# Patient Record
Sex: Female | Born: 1950
Health system: Southern US, Community
[De-identification: ages and names within clinical notes are randomized; demographics above are authoritative.]

## PROBLEM LIST (undated history)

## (undated) DIAGNOSIS — E079 Disorder of thyroid, unspecified: Secondary | ICD-10-CM

## (undated) DIAGNOSIS — E785 Hyperlipidemia, unspecified: Secondary | ICD-10-CM

## (undated) DIAGNOSIS — R251 Tremor, unspecified: Secondary | ICD-10-CM

## (undated) DIAGNOSIS — I10 Essential (primary) hypertension: Secondary | ICD-10-CM

## (undated) HISTORY — DX: Essential (primary) hypertension: I10

## (undated) HISTORY — DX: Disorder of thyroid, unspecified: E07.9

## (undated) HISTORY — DX: Hyperlipidemia, unspecified: E78.5

---

## 1997-11-13 ENCOUNTER — Other Ambulatory Visit: Admission: RE | Admit: 1997-11-13 | Discharge: 1997-11-13 | Payer: Self-pay | Admitting: Obstetrics and Gynecology

## 2002-02-24 ENCOUNTER — Encounter: Admission: RE | Admit: 2002-02-24 | Discharge: 2002-02-24 | Payer: Self-pay | Admitting: Obstetrics and Gynecology

## 2002-02-24 ENCOUNTER — Encounter: Payer: Self-pay | Admitting: Obstetrics and Gynecology

## 2003-09-06 ENCOUNTER — Encounter: Admission: RE | Admit: 2003-09-06 | Discharge: 2003-09-06 | Payer: Self-pay | Admitting: Obstetrics and Gynecology

## 2004-04-27 ENCOUNTER — Emergency Department (HOSPITAL_COMMUNITY): Admission: EM | Admit: 2004-04-27 | Discharge: 2004-04-27 | Payer: Self-pay | Admitting: Family Medicine

## 2004-09-09 ENCOUNTER — Encounter: Admission: RE | Admit: 2004-09-09 | Discharge: 2004-09-09 | Payer: Self-pay | Admitting: Obstetrics and Gynecology

## 2004-09-23 ENCOUNTER — Encounter: Admission: RE | Admit: 2004-09-23 | Discharge: 2004-09-23 | Payer: Self-pay | Admitting: Obstetrics and Gynecology

## 2004-12-29 ENCOUNTER — Encounter: Admission: RE | Admit: 2004-12-29 | Discharge: 2004-12-29 | Payer: Self-pay | Admitting: Family Medicine

## 2005-02-03 ENCOUNTER — Ambulatory Visit (HOSPITAL_COMMUNITY): Admission: RE | Admit: 2005-02-03 | Discharge: 2005-02-03 | Payer: Self-pay | Admitting: Gastroenterology

## 2005-09-10 ENCOUNTER — Encounter: Admission: RE | Admit: 2005-09-10 | Discharge: 2005-09-10 | Payer: Self-pay | Admitting: Obstetrics and Gynecology

## 2006-09-13 ENCOUNTER — Encounter: Admission: RE | Admit: 2006-09-13 | Discharge: 2006-09-13 | Payer: Self-pay | Admitting: Family Medicine

## 2007-09-14 ENCOUNTER — Encounter: Admission: RE | Admit: 2007-09-14 | Discharge: 2007-09-14 | Payer: Self-pay | Admitting: Obstetrics and Gynecology

## 2008-09-17 ENCOUNTER — Encounter: Admission: RE | Admit: 2008-09-17 | Discharge: 2008-09-17 | Payer: Self-pay | Admitting: Obstetrics and Gynecology

## 2010-02-11 ENCOUNTER — Encounter: Admission: RE | Admit: 2010-02-11 | Discharge: 2010-02-11 | Payer: Self-pay | Admitting: Obstetrics and Gynecology

## 2010-06-22 ENCOUNTER — Encounter: Payer: Self-pay | Admitting: Obstetrics and Gynecology

## 2013-03-20 ENCOUNTER — Other Ambulatory Visit: Payer: Self-pay

## 2013-03-20 DIAGNOSIS — Z1231 Encounter for screening mammogram for malignant neoplasm of breast: Secondary | ICD-10-CM

## 2013-04-12 ENCOUNTER — Ambulatory Visit
Admission: RE | Admit: 2013-04-12 | Discharge: 2013-04-12 | Disposition: A | Discharge status: Home / Self Care | Payer: No Typology Code available for payment source | Source: Ambulatory Visit

## 2013-04-12 DIAGNOSIS — Z1231 Encounter for screening mammogram for malignant neoplasm of breast: Secondary | ICD-10-CM

## 2013-04-14 ENCOUNTER — Other Ambulatory Visit: Payer: Self-pay | Admitting: Nurse Practitioner

## 2013-04-14 DIAGNOSIS — R928 Other abnormal and inconclusive findings on diagnostic imaging of breast: Secondary | ICD-10-CM

## 2013-04-18 ENCOUNTER — Other Ambulatory Visit: Payer: Self-pay | Admitting: Obstetrics & Gynecology

## 2013-04-18 DIAGNOSIS — Z780 Asymptomatic menopausal state: Secondary | ICD-10-CM

## 2013-05-05 ENCOUNTER — Ambulatory Visit
Admission: RE | Admit: 2013-05-05 | Discharge: 2013-05-05 | Disposition: A | Discharge status: Home / Self Care | Payer: No Typology Code available for payment source | Source: Ambulatory Visit | Attending: Nurse Practitioner | Admitting: Nurse Practitioner

## 2013-05-05 DIAGNOSIS — R928 Other abnormal and inconclusive findings on diagnostic imaging of breast: Secondary | ICD-10-CM

## 2013-10-02 ENCOUNTER — Other Ambulatory Visit: Payer: Self-pay | Admitting: Nurse Practitioner

## 2013-10-02 DIAGNOSIS — N6489 Other specified disorders of breast: Secondary | ICD-10-CM

## 2013-11-06 ENCOUNTER — Encounter (INDEPENDENT_AMBULATORY_CARE_PROVIDER_SITE_OTHER): Payer: Self-pay

## 2013-11-06 ENCOUNTER — Ambulatory Visit
Admission: RE | Admit: 2013-11-06 | Discharge: 2013-11-06 | Disposition: A | Discharge status: Home / Self Care | Payer: 59 | Source: Ambulatory Visit | Attending: Nurse Practitioner | Admitting: Nurse Practitioner

## 2013-11-06 DIAGNOSIS — N6489 Other specified disorders of breast: Secondary | ICD-10-CM

## 2014-11-15 ENCOUNTER — Other Ambulatory Visit: Payer: Self-pay | Admitting: Nurse Practitioner

## 2014-11-15 DIAGNOSIS — N6320 Unspecified lump in the left breast, unspecified quadrant: Secondary | ICD-10-CM

## 2014-11-23 ENCOUNTER — Ambulatory Visit
Admission: RE | Admit: 2014-11-23 | Discharge: 2014-11-23 | Disposition: A | Discharge status: Home / Self Care | Payer: Medicare Other | Source: Ambulatory Visit | Attending: Nurse Practitioner | Admitting: Nurse Practitioner

## 2014-11-23 DIAGNOSIS — N6320 Unspecified lump in the left breast, unspecified quadrant: Secondary | ICD-10-CM

## 2015-12-31 DIAGNOSIS — D649 Anemia, unspecified: Secondary | ICD-10-CM | POA: Diagnosis not present

## 2015-12-31 DIAGNOSIS — N951 Menopausal and female climacteric states: Secondary | ICD-10-CM | POA: Diagnosis not present

## 2015-12-31 DIAGNOSIS — E559 Vitamin D deficiency, unspecified: Secondary | ICD-10-CM | POA: Diagnosis not present

## 2015-12-31 DIAGNOSIS — Z1321 Encounter for screening for nutritional disorder: Secondary | ICD-10-CM | POA: Diagnosis not present

## 2015-12-31 DIAGNOSIS — R5383 Other fatigue: Secondary | ICD-10-CM | POA: Diagnosis not present

## 2015-12-31 DIAGNOSIS — E7800 Pure hypercholesterolemia, unspecified: Secondary | ICD-10-CM | POA: Diagnosis not present

## 2015-12-31 DIAGNOSIS — Z13220 Encounter for screening for lipoid disorders: Secondary | ICD-10-CM | POA: Diagnosis not present

## 2015-12-31 DIAGNOSIS — Z139 Encounter for screening, unspecified: Secondary | ICD-10-CM | POA: Diagnosis not present

## 2015-12-31 DIAGNOSIS — E039 Hypothyroidism, unspecified: Secondary | ICD-10-CM | POA: Diagnosis not present

## 2016-03-04 ENCOUNTER — Other Ambulatory Visit: Payer: Self-pay | Admitting: Nurse Practitioner

## 2016-03-04 DIAGNOSIS — Z1231 Encounter for screening mammogram for malignant neoplasm of breast: Secondary | ICD-10-CM

## 2016-03-20 ENCOUNTER — Ambulatory Visit
Admission: RE | Admit: 2016-03-20 | Discharge: 2016-03-20 | Disposition: A | Discharge status: Home / Self Care | Payer: Medicare Other | Source: Ambulatory Visit | Attending: Nurse Practitioner | Admitting: Nurse Practitioner

## 2016-03-20 DIAGNOSIS — Z1231 Encounter for screening mammogram for malignant neoplasm of breast: Secondary | ICD-10-CM

## 2017-08-23 ENCOUNTER — Other Ambulatory Visit: Payer: Self-pay

## 2017-08-23 ENCOUNTER — Encounter: Payer: Self-pay | Admitting: Physician Assistant

## 2017-08-23 ENCOUNTER — Ambulatory Visit (INDEPENDENT_AMBULATORY_CARE_PROVIDER_SITE_OTHER): Payer: Medicare Other | Admitting: Physician Assistant

## 2017-08-23 DIAGNOSIS — E039 Hypothyroidism, unspecified: Secondary | ICD-10-CM | POA: Insufficient documentation

## 2017-08-23 DIAGNOSIS — E785 Hyperlipidemia, unspecified: Secondary | ICD-10-CM

## 2017-08-23 NOTE — Progress Notes (Signed)
Patient ID: Darlene Marsh MRN: [id number], DOB: [birth year], 67 y.o. Date of Encounter: 08/23/2017,   Chief Complaint: Establish Care  HPI: 67 y.o. y/o female here to [place].   Reports that she had been going to [facility] until her office closed.  Reports that she was on thyroid pills for over 30 years.  Says that it was "the smallest dose ". Has been out of the thyroid medication since around November.  Says that [facility] Schwartz also would check her cholesterol every year.  Says that this past November was the one year mark and knows that it is been over a year since this was last checked.  She says that in November her mammogram was also due and that when she tried to schedule it--- they told her they could not do it unless she had a PCP. Her mom and her mom's 2 sisters had breast cancer so that is why she make sure she gets her mammograms annually.  Patient reports that she personally has no history of any GYN cancer.  Has had no ovarian or uterine cancer.  Asked if she is wanting to check a full panel of labs or just thyroid and cholesterol.  She says that she only wants to check those 2 things. Says her cholesterol has been " a little high" in the past.  Says otherwise she is feeling fine and feels that "if it's not broken, don't have to fix it ". She refuses to have any immunizations.  Says that [facility] used to talk to her about pneumonia vaccines etc. but she feels fine and does not want these. Says that she did have a colonoscopy about 5 years ago and that was normal.  Reports that she has no other known medical problems no other past medical history.  She has no other concerns to address today.   Review of Systems: Consitutional: No fever, chills, fatigue, night sweats, lymphadenopathy. No significant/unexplained weight changes. Eyes: No visual changes, eye redness, or discharge. ENT/Mouth: No ear pain, sore throat, nasal drainage, or sinus pain. Cardiovascular: No chest  pressure,heaviness, tightness or squeezing, even with exertion. No increased shortness of breath or dyspnea on exertion.No palpitations, edema, orthopnea, PND. Respiratory: No cough, hemoptysis, SOB, or wheezing. Gastrointestinal: No anorexia, dysphagia, reflux, pain, nausea, vomiting, hematemesis, diarrhea, constipation, BRBPR, or melena. Breast: No mass, nodules, bulging, or retraction. No skin changes or inflammation. No nipple discharge. No lymphadenopathy. Genitourinary: No dysuria, hematuria, incontinence, vaginal discharge, pruritis, burning, abnormal bleeding, or pain. Musculoskeletal: No decreased ROM, No joint pain or swelling. No significant pain in neck, back, or extremities. Skin: No rash, pruritis, or concerning lesions. Neurological: No headache, dizziness, syncope, seizures, tremors, memory loss, coordination problems, or paresthesias. Psychological: No anxiety, depression, hallucinations, SI/HI. Endocrine: No polydipsia, polyphagia, polyuria, or known diabetes.No increased fatigue. No palpitations/rapid heart rate. No significant/unexplained weight change. All other systems were reviewed and are otherwise negative.  No past medical history on file.     Home Meds:  No outpatient medications prior to visit.   No facility-administered medications prior to visit.     Allergies: No Known Allergies  Social History   Socioeconomic History  . Marital status: Married    Spouse name: Not on file  . Number of children: Not on file  . Years of education: Not on file  . Highest education level: Not on file  Occupational History  . Not on file  Social Needs  . Financial resource strain: Not on file  .  Food insecurity:    Worry: Not on file    Inability: Not on file  . Transportation needs:    Medical: Not on file    Non-medical: Not on file  Tobacco Use  . Smoking status: Never Smoker  . Smokeless tobacco: Never Used  Substance and Sexual Activity  . Alcohol use:  Never    Frequency: Never  . Drug use: Never  . Sexual activity: Not on file  Lifestyle  . Physical activity:    Days per week: Not on file    Minutes per session: Not on file  . Stress: Not on file  Relationships  . Social connections:    Talks on phone: Not on file    Gets together: Not on file    Attends religious service: Not on file    Active member of club or organization: Not on file    Attends meetings of clubs or organizations: Not on file    Relationship status: Not on file  . Intimate partner violence:    Fear of current or ex partner: Not on file    Emotionally abused: Not on file    Physically abused: Not on file    Forced sexual activity: Not on file  Other Topics Concern  . Not on file  Social History Narrative  . Not on file    No family history on file.  Physical Exam: Blood pressure 138/80, pulse 74, temperature 98.2 F (36.8 C), temperature source Oral, resp. rate 14, height 5' 6.5" (1.689 m), weight 63.6 kg (140 lb 3.2 oz), SpO2 98 %., Body mass index is 22.29 kg/m. General: Well developed, well nourished WF. Very stoic. Appears in no acute distress. Neck: Supple. Trachea midline. No thyromegaly. Full ROM. No lymphadenopathy.No Carotid Bruits. Lungs: Clear to auscultation bilaterally without wheezes, rales, or rhonchi. Breathing is of normal effort and unlabored. Cardiovascular: RRR with S1 S2. No murmurs, rubs, or gallops. Distal pulses 2+ symmetrically. No carotid or abdominal bruits. Abdomen: Soft, non-tender, non-distended with normoactive bowel sounds. No hepatosplenomegaly or masses. No rebound/guarding. No CVA tenderness. No hernias.  Musculoskeletal: Full range of motion and 5/5 strength throughout.  Skin: Warm and moist without erythema, ecchymosis, wounds, or rash. Neuro: A+Ox3. CN II-XII grossly intact. Moves all extremities spontaneously. Full sensation throughout. Normal gait.  Psych:  Very stoic. Responds to questions appropriately with a  normal affect.   Assessment/Plan:  67 y.o. y/o female here for    1. Hypothyroidism, unspecified type Check TSH and then restart thyroid medication accordingly. - TSH  2. Hyperlipidemia, unspecified hyperlipidemia type Will recheck cholesterol. - Lipid panel - Hepatic function panel  She did not want to do a CPE or update much preventive care but--- today I did go ahead and discussed the following:  A. Screening Labs: She does not want to check other screening labs.  Just thyroid and cholesterol. B. Pap: Reports she has no history of GYN cancers.  She is now over 81 years old so does not need any further Pap smear.  C. Screening Mammogram: Reports that she will call to schedule her mammogram herself.  They will let her schedule it now that she has a PCP established. D. DEXA/BMD:  She does not want to have a bone density scan.  E. Colorectal Cancer Screening: Reports that she had a colonoscopy 5 years ago and this was normal.  F. Immunizations: ------------ reports that she does not want to do any type of vaccines or immunizations.  She is  aware of risk versus benefit but defers. Influenza: Tetanus: Pneumococcal: Zostavax:    Marin Olp [place], [place], [facility] 08/23/2017 9:42 AM

## 2017-08-24 LAB — HEPATIC FUNCTION PANEL
AG RATIO: 1.6 (calc) (ref 1.0–2.5)
ALT: 11 U/L (ref 6–29)
AST: 13 U/L (ref 10–35)
Albumin: 4.2 g/dL (ref 3.6–5.1)
Alkaline phosphatase (APISO): 89 U/L (ref 33–130)
BILIRUBIN DIRECT: 0.2 mg/dL (ref 0.0–0.2)
BILIRUBIN INDIRECT: 0.7 mg/dL (ref 0.2–1.2)
BILIRUBIN TOTAL: 0.9 mg/dL (ref 0.2–1.2)
GLOBULIN: 2.6 g/dL (ref 1.9–3.7)
TOTAL PROTEIN: 6.8 g/dL (ref 6.1–8.1)

## 2017-08-24 LAB — LIPID PANEL
CHOLESTEROL: 228 mg/dL — AB (ref <200)
HDL: 69 mg/dL (ref >50)
LDL CHOLESTEROL (CALC): 143 mg/dL — AB
Non-HDL Cholesterol (Calc): 159 mg/dL (calc) — ABNORMAL HIGH (ref <130)
TRIGLYCERIDES: 68 mg/dL (ref <150)
Total CHOL/HDL Ratio: 3.3 (calc) (ref <5.0)

## 2017-08-24 LAB — TSH: TSH: 2.54 mIU/L (ref 0.40–4.50)

## 2018-01-05 ENCOUNTER — Encounter: Payer: Self-pay | Admitting: Physician Assistant

## 2018-01-05 ENCOUNTER — Ambulatory Visit (INDEPENDENT_AMBULATORY_CARE_PROVIDER_SITE_OTHER): Payer: Medicare Other | Admitting: Physician Assistant

## 2018-01-05 DIAGNOSIS — I10 Essential (primary) hypertension: Secondary | ICD-10-CM | POA: Diagnosis not present

## 2018-01-05 MED ORDER — LISINOPRIL 10 MG PO TABS: 10.0000 mg | ORAL_TABLET | Freq: Every day | ORAL | 0 refills | Status: DC

## 2018-01-05 NOTE — Progress Notes (Signed)
Patient ID: Darlene Marsh MRN: [id number], DOB: September 11, 1950, 67 y.o. Date of Encounter: 01/05/2018,   Chief Complaint: High Blood Pressure  HPI: 67 y.o. y/o female here regarding high BP.   08/23/2017: She presents to [facility].   Reports that she had been going to [facility] until her office closed.  Reports that she was on thyroid pills for over 30 years.  Says that it was "the smallest dose ". Has been out of the thyroid medication since around November.  Says that Darlene Marsh also would check her cholesterol every year.  Says that this past November was the one year mark and knows that it is been over a year since this was last checked.  She says that in November her mammogram was also due and that when she tried to schedule it--- they told her they could not do it unless she had a PCP. Her mom and her mom's 2 sisters had breast cancer so that is why she make sure she gets her mammograms annually.  Patient reports that she personally has no history of any GYN cancer.  Has had no ovarian or uterine cancer.  Asked if she is wanting to check a full panel of labs or just thyroid and cholesterol.  She says that she only wants to check those 2 things. Says her cholesterol has been " a little high" in the past.  Says otherwise she is feeling fine and feels that "if it's not broken, don't have to fix it ". She refuses to have any immunizations.  Says that Darlene Marsh used to talk to her about pneumonia vaccines etc. but she feels fine and does not want these. Says that she did have a colonoscopy about 5 years ago and that was normal.  Reports that she has no other known medical problems no other past medical history.  She has no other concerns to address today.  AT THAT OV: Checked TSH, FLP, LFT. TSH was normal----that was performed off of medication.  Therefore told her she could stay off thyroid medication. Regarding lipids-----was told to decrease saturated fats in  diet.     01/05/2018: She reports that she will occasionally check her blood pressure--- about every couple months--- if she is in a [facility].   States that [facility] and checked [facility] blood pressure and got systolic reading 845.  Therefore scheduled today's visit. She has no other concerns to address today. She has been having no chest pressure, heaviness, tightness, squeezing.  No increased shortness of breath.  No weakness in any extremity or slurred speech etc.     Review of Systems: Consitutional: No fever, chills, fatigue, night sweats, lymphadenopathy. No significant/unexplained weight changes. Eyes: No visual changes, eye redness, or discharge. ENT/Mouth: No ear pain, sore throat, nasal drainage, or sinus pain. Cardiovascular: No chest pressure,heaviness, tightness or squeezing, even with exertion. No increased shortness of breath or dyspnea on exertion.No palpitations, edema, orthopnea, PND. Respiratory: No cough, hemoptysis, SOB, or wheezing. Gastrointestinal: No anorexia, dysphagia, reflux, pain, nausea, vomiting, hematemesis, diarrhea, constipation, BRBPR, or melena. Breast: No mass, nodules, bulging, or retraction. No skin changes or inflammation. No nipple discharge. No lymphadenopathy. Genitourinary: No dysuria, hematuria, incontinence, vaginal discharge, pruritis, burning, abnormal bleeding, or pain. Musculoskeletal: No decreased ROM, No joint pain or swelling. No significant pain in neck, back, or extremities. Skin: No rash, pruritis, or concerning lesions. Neurological: No headache, dizziness, syncope, seizures, tremors, memory loss, coordination problems, or paresthesias. Psychological: No anxiety, depression, hallucinations,  SI/HI. Endocrine: No polydipsia, polyphagia, polyuria, or known diabetes.No increased fatigue. No palpitations/rapid heart rate. No significant/unexplained weight change. All other systems were reviewed and are otherwise negative.  Past  Medical History:  Diagnosis Date  . Thyroid disease    underactive       Home Meds:  No outpatient medications prior to visit.   No facility-administered medications prior to visit.     Allergies: No Known Allergies  Social History   Socioeconomic History  . Marital status: Married    Spouse name: Not on file  . Number of children: Not on file  . Years of education: Not on file  . Highest education level: Not on file  Occupational History  . Not on file  Social Needs  . Financial resource strain: Not on file  . Food insecurity:    Worry: Not on file    Inability: Not on file  . Transportation needs:    Medical: Not on file    Non-medical: Not on file  Tobacco Use  . Smoking status: Never Smoker  . Smokeless tobacco: Never Used  Substance and Sexual Activity  . Alcohol use: Never    Frequency: Never  . Drug use: Never  . Sexual activity: Not on file  Lifestyle  . Physical activity:    Days per week: Not on file    Minutes per session: Not on file  . Stress: Not on file  Relationships  . Social connections:    Talks on phone: Not on file    Gets together: Not on file    Attends religious service: Not on file    Active member of club or organization: Not on file    Attends meetings of clubs or organizations: Not on file    Relationship status: Not on file  . Intimate partner violence:    Fear of current or ex partner: Not on file    Emotionally abused: Not on file    Physically abused: Not on file    Forced sexual activity: Not on file  Other Topics Concern  . Not on file  Social History Narrative  . Not on file    History reviewed. No pertinent family history.     Physical Exam: Blood pressure (!) 152/88, pulse 61, temperature 98.2 F (36.8 C), temperature source Oral, resp. rate 16, height 5' 6.5" (1.689 m), weight 61.9 kg (136 lb 6.4 oz), SpO2 95 %., Body mass index is 21.69 kg/m. General: WNWD WF. Appears in no acute distress.  Neck: Supple.  No thyromegaly. No lymphadenopathy. No carotid bruits. Lungs: Clear bilaterally to auscultation without wheezes, rales, or rhonchi. Breathing is unlabored. Heart: RRR with S1 S2. No murmurs, rubs, or gallops. Musculoskeletal:  Strength and tone normal for age. Extremities/Skin: Warm and dry.  No LE edema.  Neuro: Alert and oriented X 3. Moves all extremities spontaneously. Gait is normal. CNII-XII grossly in tact. Psych:  Responds to questions appropriately with a normal affect.    Assessment/Plan:  67 y.o. y/o female here for    1. Essential hypertension 01/05/2018: I rechecked blood pressure myself multiple times and repeatedly and getting right at 150/82. At this time-- will start lisinopril 10 mg daily.  She is to take this every morning.   Will have her return in 2 weeks for OV to recheck BP and bmet on medication.   She is agreeable with this plan.  Voices understanding and agrees. - lisinopril (PRINIVIL,ZESTRIL) 10 MG tablet; Take 1 tablet (10 mg  total) by mouth daily.  Dispense: 30 tablet; Refill: 0     --------------------------------------------------------------------------------------  Hypothyroidism, unspecified type 08/23/2017---Checked TSH --- was off thyroid medication at that time. TSH was in middle of normal range. Had her remain off thyroid med.    Hyperlipidemia, unspecified hyperlipidemia type 08/23/2017----Rechecked cholesterol--FLP/LFT----she was to treat this with low saturated fat diet.  She did not want to do a CPE or update much preventive care but--- today I did go ahead and discussed the following:  A. Screening Labs: She does not want to check other screening labs.  Just thyroid and cholesterol. B. Pap: Reports she has no history of GYN cancers.  She is now over 74 years old so does not need any further Pap smear.  C. Screening Mammogram: Reports that she will call to schedule her mammogram herself.  They will let her schedule it now that she has a  PCP established. D. DEXA/BMD:  She does not want to have a bone density scan.  E. Colorectal Cancer Screening: Reports that she had a colonoscopy 5 years ago and this was normal.  F. Immunizations: ------------ reports that she does not want to do any type of vaccines or immunizations.  She is aware of risk versus benefit but defers. Influenza: Tetanus: Pneumococcal: Zostavax:    Marin Olp [place], [place], [facility] 01/05/2018 10:26 AM

## 2018-01-19 ENCOUNTER — Ambulatory Visit (INDEPENDENT_AMBULATORY_CARE_PROVIDER_SITE_OTHER): Payer: Medicare Other | Admitting: Physician Assistant

## 2018-01-19 ENCOUNTER — Encounter: Payer: Self-pay | Admitting: Physician Assistant

## 2018-01-19 ENCOUNTER — Other Ambulatory Visit: Payer: Self-pay | Admitting: Physician Assistant

## 2018-01-19 VITALS — BP 120/60 | HR 62 | Temp 98.3°F | Resp 16 | Ht 66.5 in | Wt 136.2 lb

## 2018-01-19 DIAGNOSIS — I10 Essential (primary) hypertension: Secondary | ICD-10-CM | POA: Diagnosis not present

## 2018-01-19 DIAGNOSIS — Z1231 Encounter for screening mammogram for malignant neoplasm of breast: Secondary | ICD-10-CM

## 2018-01-19 LAB — BASIC METABOLIC PANEL WITH GFR
BUN: 10 mg/dL (ref 7–25)
CO2: 29 mmol/L (ref 20–32)
CREATININE: 0.8 mg/dL (ref 0.50–0.99)
Calcium: 9.4 mg/dL (ref 8.6–10.4)
Chloride: 103 mmol/L (ref 98–110)
GFR, EST NON AFRICAN AMERICAN: 75 mL/min/{1.73_m2} (ref >=60)
GFR, Est African American: 87 mL/min/{1.73_m2} (ref >=60)
GLUCOSE: 86 mg/dL (ref 65–99)
Potassium: 4.3 mmol/L (ref 3.5–5.3)
SODIUM: 142 mmol/L (ref 135–146)

## 2018-01-19 MED ORDER — LISINOPRIL 10 MG PO TABS: 10.0000 mg | ORAL_TABLET | Freq: Every day | ORAL | 2 refills | Status: DC

## 2018-01-19 NOTE — Progress Notes (Signed)
Patient ID: Talar Fraley MRN: [id number], DOB: August 11, 1950, 67 y.o. Date of Encounter: 01/19/2018,   Chief Complaint: High Blood Pressure  HPI: 67 y.o. y/o female here regarding high BP.   08/23/2017: She presents to [facility].   Reports that she had been going to [facility] until her office closed.  Reports that she was on thyroid pills for over 30 years.  Says that it was "the smallest dose ". Has been out of the thyroid medication since around November.  Says that Manuela Schwartz also would check her cholesterol every year.  Says that this past November was the one year mark and knows that it is been over a year since this was last checked.  She says that in November her mammogram was also due and that when she tried to schedule it--- they told her they could not do it unless she had a PCP. Her mom and her mom's 2 sisters had breast cancer so that is why she make sure she gets her mammograms annually.  Patient reports that she personally has no history of any GYN cancer.  Has had no ovarian or uterine cancer.  Asked if she is wanting to check a full panel of labs or just thyroid and cholesterol.  She says that she only wants to check those 2 things. Says her cholesterol has been " a little high" in the past.  Says otherwise she is feeling fine and feels that "if it's not broken, don't have to fix it ". She refuses to have any immunizations.  Says that Manuela Schwartz used to talk to her about pneumonia vaccines etc. but she feels fine and does not want these. Says that she did have a colonoscopy about 5 years ago and that was normal.  Reports that she has no other known medical problems no other past medical history.  She has no other concerns to address today.  AT THAT OV: Checked TSH, FLP, LFT. TSH was normal----that was performed off of medication.  Therefore told her she could stay off thyroid medication. Regarding lipids-----was told to decrease saturated fats in  diet.     01/05/2018: She reports that she will occasionally check her blood pressure--- about every couple months--- if [facility] is [facility] a [facility].   States that [facility] and [facility] and got systolic reading 295.  Therefore scheduled today's visit. She has no other concerns to address today. She has been having no chest pressure, heaviness, tightness, squeezing.  No increased shortness of breath.  No weakness in any extremity or slurred speech etc.  AT THAT OV: Pressure reading here was 152/88 by nursing staff and I was also getting 150/82 when I rechecked it myself. Started Lisinopril 10mg  QD     01/19/2018: Today she reports that she has been taking the lisinopril 10 mg daily since last visit.  She reports that she is having no lightheadedness.  She is having no other adverse effects. States that she checked her blood pressure [facility] at the [facility] since then and got low reading of around 284 systolic.  However states that even at that time she was having no lightheadedness and was feeling fine.       Review of Systems: Consitutional: No fever, chills, fatigue, night sweats, lymphadenopathy. No significant/unexplained weight changes. Eyes: No visual changes, eye redness, or discharge. ENT/Mouth: No ear pain, sore throat, nasal drainage, or sinus pain. Cardiovascular: No chest pressure,heaviness, tightness or squeezing, even with exertion. No  increased shortness of breath or dyspnea on exertion.No palpitations, edema, orthopnea, PND. Respiratory: No cough, hemoptysis, SOB, or wheezing. Gastrointestinal: No anorexia, dysphagia, reflux, pain, nausea, vomiting, hematemesis, diarrhea, constipation, BRBPR, or melena. Breast: No mass, nodules, bulging, or retraction. No skin changes or inflammation. No nipple discharge. No lymphadenopathy. Genitourinary: No dysuria, hematuria, incontinence, vaginal discharge, pruritis, burning, abnormal bleeding, or  pain. Musculoskeletal: No decreased ROM, No joint pain or swelling. No significant pain in neck, back, or extremities. Skin: No rash, pruritis, or concerning lesions. Neurological: No headache, dizziness, syncope, seizures, tremors, memory loss, coordination problems, or paresthesias. Psychological: No anxiety, depression, hallucinations, SI/HI. Endocrine: No polydipsia, polyphagia, polyuria, or known diabetes.No increased fatigue. No palpitations/rapid heart rate. No significant/unexplained weight change. All other systems were reviewed and are otherwise negative.  Past Medical History:  Diagnosis Date  . Thyroid disease    underactive       67 y.o. Home Meds:  Outpatient Medications Prior to Visit  Medication Sig Dispense Refill  . lisinopril (PRINIVIL,ZESTRIL) 10 MG tablet Take 1 tablet (10 mg total) by mouth daily. 30 tablet 0   No facility-administered medications prior to visit.     Allergies: No Known Allergies  Social History   Socioeconomic History  . Marital status: Married    Spouse name: Not on file  . Number of children: Not on file  . Years of education: Not on file  . Highest education level: Not on file  Occupational History  . Not on file  Social Needs  . Financial resource strain: Not on file  . Food insecurity:    Worry: Not on file    Inability: Not on file  . Transportation needs:    Medical: Not on file    Non-medical: Not on file  Tobacco Use  . Smoking status: Never Smoker  . Smokeless tobacco: Never Used  Substance and Sexual Activity  . Alcohol use: Never    Frequency: Never  . Drug use: Never  . Sexual activity: Not on file  Lifestyle  . Physical activity:    Days per week: Not on file    Minutes per session: Not on file  . Stress: Not on file  Relationships  . Social connections:    Talks on phone: Not on file    Gets together: Not on file    Attends religious service: Not on file    Active member of club or organization: Not on file     Attends meetings of clubs or organizations: Not on file    Relationship status: Not on file  . Intimate partner violence:    Fear of current or ex partner: Not on file    Emotionally abused: Not on file    Physically abused: Not on file    Forced sexual activity: Not on file  Other Topics Concern  . Not on file  Social History Narrative  . Not on file    History reviewed. No pertinent family history.    Physical Exam: Blood pressure 120/60, pulse 62, temperature 98.3 F (36.8 C), temperature source Oral, resp. rate 16, height 5' 6.5" (1.689 m), weight 61.8 kg, SpO2 95 %., Body mass index is 21.65 kg/m. General: WNWD WF. Appears in no acute distress. Neck: Supple. No thyromegaly. No lymphadenopathy. No carotid bruits. Lungs: Clear bilaterally to auscultation without wheezes, rales, or rhonchi. Breathing is unlabored. Heart: RRR with S1 S2. No murmurs, rubs, or gallops. Musculoskeletal:  Strength and tone normal for age. Extremities/Skin: Warm and dry.  No  edema.  Neuro: Alert and oriented X 3. Moves all extremities spontaneously. Gait is normal. CNII-XII grossly in tact. Psych:  Responds to questions appropriately with a normal affect.     Assessment/Plan:  67 y.o. y/o female here for    1. Essential hypertension 01/05/2018: I rechecked blood pressure myself multiple times and repeatedly and getting right at 150/82. At this time-- will start lisinopril 10 mg daily.  She is to take this every morning.   Will have her return in 2 weeks for OV to recheck BP and bmet on medication.   She is agreeable with this plan.  Voices understanding and agrees. - lisinopril (PRINIVIL,ZESTRIL) 10 MG tablet; Take 1 tablet (10 mg total) by mouth daily.  Dispense: 30 tablet; Refill: 0  01/19/2018: Blood pressure is now at goal/controlled.  Continue lisinopril 10 mg daily.  Check lab to document bmet on medication.  Discussed timing of follow-up with patient.  She prefers for me to just put down  for 1 year and that she will follow-up sooner if needed.   --------------------------------------------------------------------------------------  Hypothyroidism, unspecified type 08/23/2017---Checked TSH --- was off thyroid medication at that time. TSH was in middle of normal range. Had her remain off thyroid med.    Hyperlipidemia, unspecified hyperlipidemia type 08/23/2017----Rechecked cholesterol--FLP/LFT----she was to treat this with low saturated fat diet.  She did not want to do a CPE or update much preventive care but--- today I did go ahead and discussed the following:  A. Screening Labs: She does not want to check other screening labs.  Just thyroid and cholesterol. B. Pap: Reports she has no history of GYN cancers.  She is now over 35 years old so does not need any further Pap smear.  C. Screening Mammogram: Reports that she will call to schedule her mammogram herself.  They will let her schedule it now that she has a PCP established. D. DEXA/BMD:  She does not want to have a bone density scan.  E. Colorectal Cancer Screening: Reports that she had a colonoscopy 5 years ago and this was normal.  F. Immunizations: ------------ reports that she does not want to do any type of vaccines or immunizations.  She is aware of risk versus benefit but defers. Influenza: Tetanus: Pneumococcal: Zostavax:    Marin Olp , [place], [facility] 01/19/2018 9:37 AM

## 2018-03-23 ENCOUNTER — Ambulatory Visit
Admission: RE | Admit: 2018-03-23 | Discharge: 2018-03-23 | Disposition: A | Discharge status: Home / Self Care | Payer: Medicare Other | Source: Ambulatory Visit | Attending: Physician Assistant | Admitting: Physician Assistant

## 2018-03-23 DIAGNOSIS — Z1231 Encounter for screening mammogram for malignant neoplasm of breast: Secondary | ICD-10-CM | POA: Diagnosis not present

## 2018-07-07 ENCOUNTER — Ambulatory Visit: Payer: Medicare Other | Admitting: Family Medicine

## 2018-12-26 ENCOUNTER — Other Ambulatory Visit: Payer: Self-pay | Admitting: *Deleted

## 2018-12-26 DIAGNOSIS — I10 Essential (primary) hypertension: Secondary | ICD-10-CM

## 2018-12-26 MED ORDER — LISINOPRIL 10 MG PO TABS: 10.0000 mg | ORAL_TABLET | Freq: Every day | ORAL | 0 refills | Status: DC

## 2018-12-30 ENCOUNTER — Other Ambulatory Visit: Payer: Self-pay

## 2019-02-01 ENCOUNTER — Other Ambulatory Visit: Payer: Self-pay | Admitting: Family Medicine

## 2019-02-01 DIAGNOSIS — Z1231 Encounter for screening mammogram for malignant neoplasm of breast: Secondary | ICD-10-CM

## 2019-03-15 ENCOUNTER — Other Ambulatory Visit: Payer: Self-pay

## 2019-03-15 ENCOUNTER — Ambulatory Visit (INDEPENDENT_AMBULATORY_CARE_PROVIDER_SITE_OTHER): Payer: Medicare Other | Admitting: Family Medicine

## 2019-03-15 ENCOUNTER — Encounter: Payer: Self-pay | Admitting: Family Medicine

## 2019-03-15 VITALS — BP 118/62 | HR 64 | Temp 98.0°F | Resp 12 | Ht 66.5 in | Wt 133.0 lb

## 2019-03-15 DIAGNOSIS — E039 Hypothyroidism, unspecified: Secondary | ICD-10-CM

## 2019-03-15 DIAGNOSIS — E785 Hyperlipidemia, unspecified: Secondary | ICD-10-CM

## 2019-03-15 DIAGNOSIS — I10 Essential (primary) hypertension: Secondary | ICD-10-CM

## 2019-03-15 NOTE — Patient Instructions (Signed)
F/U 1 year  We will call with lab results

## 2019-03-15 NOTE — Assessment & Plan Note (Signed)
Recheck TSH again,no current meds

## 2019-03-15 NOTE — Assessment & Plan Note (Signed)
Pt is open to lipid meds if needed, she does try to watch diet

## 2019-03-15 NOTE — Progress Notes (Signed)
   Subjective:    Patient ID: Darlene Marsh, female    DOB: 11/07/1950, 68 y.o.   MRN: [id number]  Patient presents for Follow-up (is fasting)  Patient here to follow-up chronic medical problems.  She was last seen a year ago.  At that time she is followed by my PA this is my first time meeting her.  I reviewed her history and medications in detail.   Hypertension currently on lisinopril 10 mg every other day  when she doesn't take meds,systolic will be in [id number], most days down to 120's with meds ,  No side effects   History of hypothyroidism but she is not on any thyroid replacement.  She had normal TSH last year so was recommended that she stay off of the thyroid medication she does not have any history of thyroid cancer.   Hyperlipidemia also noted in her chart she is not on any statin drug.  Her LDL was 143 in March 2019 She tries to walks her pets, stays active    He is scheduled for mammogram at the end of the month She has a family history of breast cancer so she continues to get mammograms.   Per her previous note she does not want a lot of work-up.  She also declined vaccines.   Lives with husband, has children and grand children near by  Review Of Systems:  GEN- denies fatigue, fever, weight loss,weakness, recent illness HEENT- denies eye drainage, change in vision, nasal discharge, CVS- denies chest pain, palpitations RESP- denies SOB, cough, wheeze ABD- denies N/V, change in stools, abd pain GU- denies dysuria, hematuria, dribbling, incontinence MSK- denies joint pain, muscle aches, injury Neuro- denies headache, dizziness, syncope, seizure activity       Objective:    BP 118/62   Pulse 64   Temp 98 F (36.7 C) (Oral)   Resp 12   Ht 5' 6.5" (1.689 m)   Wt 133 lb (60.3 kg)   SpO2 94%   BMI 21.15 kg/m  GEN- NAD, alert and oriented x3 HEENT- PERRL, EOMI, non injected sclera, pink conjunctiva, MMM, oropharynx clear Neck- Supple, no thyromegaly CVS- RRR, no  murmur RESP-CTAB ABD-NABS,soft,NT,ND EXT- No edema Pulses- Radial, DP- 2+        Assessment & Plan:      Problem List Items Addressed This Visit      Unprioritized   Essential hypertension - Primary    She does not take meds daily, has been doing this for years, but maintains BP She is monitoring daily      Relevant Orders   Comprehensive metabolic panel   CBC with Differential/Platelet   Hyperlipidemia    Pt is open to lipid meds if needed, she does try to watch diet       Relevant Orders   Lipid panel   Hypothyroidism    Recheck TSH again,no current meds      Relevant Orders   TSH      Note: This dictation was prepared with Dragon dictation along with smaller phrase technology. Any transcriptional errors that result from this process are unintentional.

## 2019-03-15 NOTE — Assessment & Plan Note (Signed)
She does not take meds daily, has been doing this for years, but maintains BP She is monitoring daily

## 2019-03-16 LAB — CBC WITH DIFFERENTIAL/PLATELET
Absolute Monocytes: 380 cells/uL (ref 200–950)
Basophils Absolute: 53 cells/uL (ref 0–200)
Basophils Relative: 0.7 %
Eosinophils Absolute: 53 cells/uL (ref 15–500)
Eosinophils Relative: 0.7 %
HCT: 41.7 % (ref 35.0–45.0)
Hemoglobin: 14.3 g/dL (ref 11.7–15.5)
Lymphs Abs: 2090 cells/uL (ref 850–3900)
MCH: 32.4 pg (ref 27.0–33.0)
MCHC: 34.3 g/dL (ref 32.0–36.0)
MCV: 94.6 fL (ref 80.0–100.0)
MPV: 10.2 fL (ref 7.5–12.5)
Monocytes Relative: 5 %
Neutro Abs: 5024 cells/uL (ref 1500–7800)
Neutrophils Relative %: 66.1 %
Platelets: 322 10*3/uL (ref 140–400)
RBC: 4.41 10*6/uL (ref 3.80–5.10)
RDW: 11.8 % (ref 11.0–15.0)
Total Lymphocyte: 27.5 %
WBC: 7.6 10*3/uL (ref 3.8–10.8)

## 2019-03-16 LAB — LIPID PANEL
Cholesterol: 229 mg/dL — ABNORMAL HIGH (ref <200)
HDL: 64 mg/dL (ref >=50)
LDL Cholesterol (Calc): 139 mg/dL (calc) — ABNORMAL HIGH
Non-HDL Cholesterol (Calc): 165 mg/dL (calc) — ABNORMAL HIGH (ref <130)
Total CHOL/HDL Ratio: 3.6 (calc) (ref <5.0)
Triglycerides: 137 mg/dL (ref <150)

## 2019-03-16 LAB — COMPREHENSIVE METABOLIC PANEL
AG Ratio: 1.6 (calc) (ref 1.0–2.5)
ALT: 13 U/L (ref 6–29)
AST: 14 U/L (ref 10–35)
Albumin: 4.1 g/dL (ref 3.6–5.1)
Alkaline phosphatase (APISO): 85 U/L (ref 37–153)
BUN: 11 mg/dL (ref 7–25)
CO2: 30 mmol/L (ref 20–32)
Calcium: 9.4 mg/dL (ref 8.6–10.4)
Chloride: 103 mmol/L (ref 98–110)
Creat: 0.74 mg/dL (ref 0.60–0.93)
Globulin: 2.5 g/dL (calc) (ref 1.9–3.7)
Glucose, Bld: 87 mg/dL (ref 65–99)
Potassium: 4.3 mmol/L (ref 3.5–5.3)
Sodium: 140 mmol/L (ref 135–146)
Total Bilirubin: 1.1 mg/dL (ref 0.2–1.2)
Total Protein: 6.6 g/dL (ref 6.1–8.1)

## 2019-03-16 LAB — TSH: TSH: 2.27 mIU/L (ref 0.40–4.50)

## 2019-03-27 ENCOUNTER — Other Ambulatory Visit: Payer: Self-pay

## 2019-03-27 ENCOUNTER — Ambulatory Visit
Admission: RE | Admit: 2019-03-27 | Discharge: 2019-03-27 | Disposition: A | Discharge status: Home / Self Care | Payer: Medicare Other | Source: Ambulatory Visit | Attending: Family Medicine | Admitting: Family Medicine

## 2019-03-27 DIAGNOSIS — Z1231 Encounter for screening mammogram for malignant neoplasm of breast: Secondary | ICD-10-CM

## 2019-07-04 ENCOUNTER — Other Ambulatory Visit: Payer: Self-pay | Admitting: Family Medicine

## 2019-07-04 DIAGNOSIS — I10 Essential (primary) hypertension: Secondary | ICD-10-CM

## 2019-08-06 ENCOUNTER — Ambulatory Visit: Payer: Medicare Other

## 2019-08-10 DIAGNOSIS — Z23 Encounter for immunization: Secondary | ICD-10-CM | POA: Diagnosis not present

## 2019-09-30 ENCOUNTER — Other Ambulatory Visit: Payer: Self-pay | Admitting: Family Medicine

## 2019-09-30 DIAGNOSIS — I10 Essential (primary) hypertension: Secondary | ICD-10-CM

## 2020-01-06 ENCOUNTER — Other Ambulatory Visit: Payer: Self-pay | Admitting: Family Medicine

## 2020-01-06 DIAGNOSIS — I10 Essential (primary) hypertension: Secondary | ICD-10-CM

## 2020-02-22 ENCOUNTER — Other Ambulatory Visit: Payer: Self-pay | Admitting: Family Medicine

## 2020-02-22 DIAGNOSIS — Z1231 Encounter for screening mammogram for malignant neoplasm of breast: Secondary | ICD-10-CM

## 2020-03-27 ENCOUNTER — Ambulatory Visit: Payer: Medicare Other

## 2020-04-16 ENCOUNTER — Other Ambulatory Visit: Payer: Self-pay | Admitting: Family Medicine

## 2020-04-16 DIAGNOSIS — I10 Essential (primary) hypertension: Secondary | ICD-10-CM

## 2020-05-03 ENCOUNTER — Ambulatory Visit: Payer: Medicare Other | Attending: Internal Medicine

## 2020-05-03 ENCOUNTER — Other Ambulatory Visit (HOSPITAL_BASED_OUTPATIENT_CLINIC_OR_DEPARTMENT_OTHER): Payer: Self-pay | Admitting: Internal Medicine

## 2020-05-03 DIAGNOSIS — Z23 Encounter for immunization: Secondary | ICD-10-CM

## 2020-05-03 NOTE — Progress Notes (Signed)
   Covid-19 Vaccination Clinic  Name:  Darlene Marsh    MRN: [id number] DOB: 1950-07-19  05/03/2020  Darlene Marsh was observed post Covid-19 immunization for 15 minutes without incident. She was provided with Vaccine Information Sheet and instruction to access the V-Safe system.   Darlene Marsh was instructed to call 911 with any severe reactions post vaccine: Marland Kitchen Difficulty breathing  . Swelling of face and throat  . A fast heartbeat  . A bad rash all over body  . Dizziness and weakness   Immunizations Administered    Name Date Dose VIS Date Route   JANSSEN COVID-19 VACCINE 05/03/2020  1:49 PM 0.5 mL 03/20/2020 Intramuscular   Manufacturer: [employer]   Lot: [id number]   [place]: [id number]

## 2020-05-07 MED FILL — JANSSEN COVID-19 VACCINE 0.: 0.5 | 1 days supply | Qty: 1 | Fill #0

## 2020-06-12 ENCOUNTER — Encounter: Payer: Medicare Other | Admitting: Family Medicine

## 2020-06-14 ENCOUNTER — Ambulatory Visit: Payer: Medicare Other

## 2020-08-29 ENCOUNTER — Emergency Department (HOSPITAL_BASED_OUTPATIENT_CLINIC_OR_DEPARTMENT_OTHER)
Admission: EM | Admit: 2020-08-29 | Discharge: 2020-08-29 | Disposition: A | Discharge status: Home / Self Care | Payer: Medicare Other | Attending: Emergency Medicine | Admitting: Emergency Medicine

## 2020-08-29 ENCOUNTER — Encounter (HOSPITAL_BASED_OUTPATIENT_CLINIC_OR_DEPARTMENT_OTHER): Payer: Self-pay | Admitting: *Deleted

## 2020-08-29 ENCOUNTER — Other Ambulatory Visit: Payer: Self-pay

## 2020-08-29 DIAGNOSIS — Z79899 Other long term (current) drug therapy: Secondary | ICD-10-CM | POA: Insufficient documentation

## 2020-08-29 DIAGNOSIS — R251 Tremor, unspecified: Secondary | ICD-10-CM | POA: Insufficient documentation

## 2020-08-29 DIAGNOSIS — E039 Hypothyroidism, unspecified: Secondary | ICD-10-CM | POA: Diagnosis not present

## 2020-08-29 DIAGNOSIS — I10 Essential (primary) hypertension: Secondary | ICD-10-CM | POA: Diagnosis not present

## 2020-08-29 NOTE — ED Triage Notes (Signed)
Tremors to left hand for a couple of minutes and subsided.

## 2020-08-29 NOTE — ED Provider Notes (Signed)
[facility] EMERGENC[facility] DEPT Provider Note   CSN: [id number] Arrival date & time: 08/29/20  1639     History Chief Complaint  Patient presents with  . Tremors    Darlene Marsh is a 70 y.o. female.  HPI 70 year old female presents with left hand shaking.  Occurred just prior to arrival.  She states that she was sweeping when all of a sudden her hand seem to be rhythmically shaking and would not go away.  Lasted a couple minutes.  She is frustrated because she could not straighten her hair with that hand or do any other good functions.  Seem to resolve on its own.  No headaches, tremors in the other extremities, or ever having had a similar episode.  Right now she feels fine.  She and her husband wonder if it was stress related as she found out this morning that her husband has skin cancer.   Past Medical History:  Diagnosis Date  . Hyperlipidemia   . Hypertension   . Thyroid disease    underactive    Patient Active Problem List   Diagnosis Date Noted  . Essential hypertension 01/05/2018  . Hypothyroidism 08/23/2017  . Hyperlipidemia 08/23/2017    History reviewed. No pertinent surgical history.   OB History    Gravida  3   Para  3   Term      Preterm      AB      Living        SAB      IAB      Ectopic      Multiple      Live Births              Family History  Problem Relation Age of Onset  . Cancer Mother        Breast cancer  . Cancer Sister        Breast  . Cancer Sister        Breast    Social History   Tobacco Use  . Smoking status: Never Smoker  . Smokeless tobacco: Never Used  Vaping Use  . Vaping Use: Never used  Substance Use Topics  . Alcohol use: Never  . Drug use: Never    Home Medications Prior to Admission medications   Medication Sig Start Date End Date Taking? Authorizing Provider  lisinopril (ZESTRIL) 10 MG tablet TAKE 1 TABLET(10 MG) BY MOUTH DAILY 04/16/20  Yes [place], Modena Nunnery, MD     Allergies    Patient has no known allergies.  Review of Systems   Review of Systems  Constitutional: Negative for fever.  Neurological: Positive for tremors. Negative for weakness, numbness and headaches.  All other systems reviewed and are negative.   Physical Exam Updated Vital Signs BP (!) 168/83 (BP Location: Left Arm)   Pulse 88   Temp 98.4 F (36.9 C) (Oral)   Resp 16   Ht 5\' 6"  (1.676 m)   Wt 59 kg   SpO2 95%   BMI 20.98 kg/m   Physical Exam Vitals and nursing note reviewed.  Constitutional:      Appearance: She is well-developed.  HENT:     Head: Normocephalic and atraumatic.     Right Ear: External ear normal.     Left Ear: External ear normal.     Nose: Nose normal.  Eyes:     General:        Right eye: No discharge.  Left eye: No discharge.     Extraocular Movements: Extraocular movements intact.     Pupils: Pupils are equal, round, and reactive to light.  Cardiovascular:     Rate and Rhythm: Normal rate and regular rhythm.     Heart sounds: Normal heart sounds.  Pulmonary:     Effort: Pulmonary effort is normal.     Breath sounds: Normal breath sounds.  Abdominal:     Palpations: Abdomen is soft.     Tenderness: There is no abdominal tenderness.  Skin:    General: Skin is warm and dry.  Neurological:     Mental Status: She is alert.     Motor: No tremor.     Comments: CN 3-12 grossly intact. 5/5 strength in all 4 extremities. Grossly normal sensation. Normal finger to nose.   Psychiatric:        Mood and Affect: Mood is not anxious.     ED Results / Procedures / Treatments   Labs (all labs ordered are listed, but only abnormal results are displayed) Labs Reviewed - No data to display  EKG None  Radiology No results found.  Procedures Procedures   Medications Ordered in ED Medications - No data to display  ED Course  I have reviewed the triage vital signs and the nursing notes.  Pertinent labs & imaging results that  were available during my care of the patient were reviewed by me and considered in my medical decision making (see chart for details).    MDM Rules/Calculators/A&P                          I discussed multiple options with patient.  This could have been focal tremor versus a partial focal seizure.  We discussed options including checking labs plus minus CT head.  She declines these because she is feeling better now and would like to go home and follow-up with a PCP.  She does have some hypertension here and will need follow-up.  We discussed return precautions. Final Clinical Impression(s) / ED Diagnoses Final diagnoses:  Tremor of left hand    Rx / DC Orders ED Discharge Orders    None       Sherwood Gambler, MD 08/29/20 1721

## 2020-08-29 NOTE — Discharge Instructions (Signed)
If you develop recurrent symptoms, weakness, numbness, headache, or any other new/concerning symptoms then return to the ER for evaluation.  It is very important to follow-up with her primary care physician.

## 2020-08-30 ENCOUNTER — Emergency Department (HOSPITAL_COMMUNITY): Payer: Medicare Other

## 2020-08-30 ENCOUNTER — Emergency Department (HOSPITAL_BASED_OUTPATIENT_CLINIC_OR_DEPARTMENT_OTHER): Payer: Medicare Other

## 2020-08-30 ENCOUNTER — Encounter (HOSPITAL_BASED_OUTPATIENT_CLINIC_OR_DEPARTMENT_OTHER): Payer: Self-pay | Admitting: Emergency Medicine

## 2020-08-30 ENCOUNTER — Emergency Department (HOSPITAL_BASED_OUTPATIENT_CLINIC_OR_DEPARTMENT_OTHER)
Admission: EM | Admit: 2020-08-30 | Discharge: 2020-08-31 | Disposition: A | Discharge status: Home / Self Care | Payer: Medicare Other | Attending: Emergency Medicine | Admitting: Emergency Medicine

## 2020-08-30 DIAGNOSIS — Z79899 Other long term (current) drug therapy: Secondary | ICD-10-CM | POA: Insufficient documentation

## 2020-08-30 DIAGNOSIS — G9389 Other specified disorders of brain: Secondary | ICD-10-CM | POA: Diagnosis not present

## 2020-08-30 DIAGNOSIS — E039 Hypothyroidism, unspecified: Secondary | ICD-10-CM | POA: Insufficient documentation

## 2020-08-30 DIAGNOSIS — R5383 Other fatigue: Secondary | ICD-10-CM | POA: Diagnosis present

## 2020-08-30 DIAGNOSIS — Z20822 Contact with and (suspected) exposure to covid-19: Secondary | ICD-10-CM | POA: Insufficient documentation

## 2020-08-30 DIAGNOSIS — I10 Essential (primary) hypertension: Secondary | ICD-10-CM | POA: Diagnosis not present

## 2020-08-30 DIAGNOSIS — R569 Unspecified convulsions: Secondary | ICD-10-CM | POA: Diagnosis not present

## 2020-08-30 LAB — URINALYSIS, ROUTINE W REFLEX MICROSCOPIC
Bilirubin Urine: NEGATIVE
Glucose, UA: NEGATIVE mg/dL
Hgb urine dipstick: NEGATIVE
Ketones, ur: NEGATIVE mg/dL
Leukocytes,Ua: NEGATIVE
Nitrite: NEGATIVE
Protein, ur: NEGATIVE mg/dL
Specific Gravity, Urine: 1.01 (ref 1.005–1.030)
pH: 5.5 (ref 5.0–8.0)

## 2020-08-30 LAB — CBC
HCT: 36.7 % (ref 36.0–46.0)
Hemoglobin: 12.8 g/dL (ref 12.0–15.0)
MCH: 32.6 pg (ref 26.0–34.0)
MCHC: 34.9 g/dL (ref 30.0–36.0)
MCV: 93.4 fL (ref 80.0–100.0)
Platelets: 315 10*3/uL (ref 150–400)
RBC: 3.93 MIL/uL (ref 3.87–5.11)
RDW: 11.5 % (ref 11.5–15.5)
WBC: 8.5 10*3/uL (ref 4.0–10.5)
nRBC: 0 % (ref 0.0–0.2)

## 2020-08-30 LAB — BASIC METABOLIC PANEL
Anion gap: 9 (ref 5–15)
BUN: 12 mg/dL (ref 8–23)
CO2: 28 mmol/L (ref 22–32)
Calcium: 9.1 mg/dL (ref 8.9–10.3)
Chloride: 99 mmol/L (ref 98–111)
Creatinine, Ser: 0.78 mg/dL (ref 0.44–1.00)
GFR, Estimated: >60 mL/min (ref >60)
Glucose, Bld: 109 mg/dL — ABNORMAL HIGH (ref 70–99)
Potassium: 4.3 mmol/L (ref 3.5–5.1)
Sodium: 136 mmol/L (ref 135–145)

## 2020-08-30 LAB — RESP PANEL BY RT-PCR (FLU A&B, COVID) ARPGX2
Influenza A by PCR: NEGATIVE
Influenza B by PCR: NEGATIVE
SARS Coronavirus 2 by RT PCR: NEGATIVE

## 2020-08-30 LAB — TSH: TSH: 2.699 u[IU]/mL (ref 0.350–4.500)

## 2020-08-30 MED ORDER — GADOBUTROL 1 MMOL/ML IV SOLN
6.0000 mL | Freq: Once | INTRAVENOUS | Status: AC | PRN
  Administered 2020-08-30: 6 mL via INTRAVENOUS

## 2020-08-30 NOTE — ED Notes (Signed)
Patient already to MRI, unable to obtain VS at this time.

## 2020-08-30 NOTE — ED Provider Notes (Addendum)
[facility] EMERGENCY DEPT Provider Note   CSN: [id number] Arrival date & time: 08/30/20  9509     History Chief Complaint  Patient presents with  . Fatigue    Darlene Marsh is a 70 y.o. female.  Presents to ER with concern for multiple complaints.  Over the past week patient feels like she has just been generally moving slow, poor energy.  No specific weakness.  Yesterday he had an episode of left arm trembling.  Today it felt like she was going to have another episode but did not.  Does not have any shaking at present.  No change in vision or speech.  HPI     Past Medical History:  Diagnosis Date  . Hyperlipidemia   . Hypertension   . Thyroid disease    underactive    Patient Active Problem List   Diagnosis Date Noted  . Essential hypertension 01/05/2018  . Hypothyroidism 08/23/2017  . Hyperlipidemia 08/23/2017    History reviewed. No pertinent surgical history.   OB History    Gravida  3   Para  3   Term      Preterm      AB      Living        SAB      IAB      Ectopic      Multiple      Live Births              Family History  Problem Relation Age of Onset  . Cancer Mother        Breast cancer  . Cancer Sister        Breast  . Cancer Sister        Breast    Social History   Tobacco Use  . Smoking status: Never Smoker  . Smokeless tobacco: Never Used  Vaping Use  . Vaping Use: Never used  Substance Use Topics  . Alcohol use: Never  . Drug use: Never    Home Medications Prior to Admission medications   Medication Sig Start Date End Date Taking? Authorizing Provider  lisinopril (ZESTRIL) 10 MG tablet TAKE 1 TABLET(10 MG) BY MOUTH DAILY 04/16/20   Alycia Rossetti, MD    Allergies    Patient has no known allergies.  Review of Systems   Review of Systems  Constitutional: Negative for chills and fever.  HENT: Negative for ear pain and sore throat.   Eyes: Negative for pain and visual disturbance.   Respiratory: Negative for cough and shortness of breath.   Cardiovascular: Negative for chest pain and palpitations.  Gastrointestinal: Negative for abdominal pain and vomiting.  Genitourinary: Negative for dysuria and hematuria.  Musculoskeletal: Negative for arthralgias and back pain.  Skin: Negative for color change and rash.  Neurological: Positive for tremors. Negative for syncope.  All other systems reviewed and are negative.   Physical Exam Updated Vital Signs BP 126/69   Pulse 78   Temp 98.9 F (37.2 C) (Oral)   Resp 16   Ht 5\' 6"  (1.676 m)   Wt 59 kg   SpO2 100%   BMI 20.98 kg/m   Physical Exam Vitals and nursing note reviewed.  Constitutional:      General: She is not in acute distress.    Appearance: She is well-developed.  HENT:     Head: Normocephalic and atraumatic.  Eyes:     Conjunctiva/sclera: Conjunctivae normal.  Cardiovascular:     Rate and Rhythm:  Normal rate and regular rhythm.     Heart sounds: No murmur heard.   Pulmonary:     Effort: Pulmonary effort is normal. No respiratory distress.     Breath sounds: Normal breath sounds.  Abdominal:     Palpations: Abdomen is soft.     Tenderness: There is no abdominal tenderness.  Musculoskeletal:     Cervical back: Neck supple.  Skin:    General: Skin is warm and dry.  Neurological:     Mental Status: She is alert.     Comments: AAOx3 CN 2-12 intact, speech clear visual fields intact 5/5 strength in b/l UE and LE Sensation to light touch intact in b/l UE and LE Normal FNF Normal gait     ED Results / Procedures / Treatments   Labs (all labs ordered are listed, but only abnormal results are displayed) Labs Reviewed  BASIC METABOLIC PANEL - Abnormal; Notable for the following components:      Result Value   Glucose, Bld 109 (*)    All other components within normal limits  URINALYSIS, ROUTINE W REFLEX MICROSCOPIC - Abnormal; Notable for the following components:   Color, Urine  COLORLESS (*)    All other components within normal limits  RESP PANEL BY RT-PCR (FLU A&B, COVID) ARPGX2  CBC  TSH    EKG None  Radiology CT Head Wo Contrast  Result Date: 08/30/2020 CLINICAL DATA:  70 year old female with seizure. EXAM: CT HEAD WITHOUT CONTRAST TECHNIQUE: Contiguous axial images were obtained from the base of the skull through the vertex without intravenous contrast. COMPARISON:  None. FINDINGS: Brain: The ventricles and sulci appropriate size for patient's age. Mild periventricular and deep white matter chronic microvascular ischemic changes noted. There is no acute intracranial hemorrhage. There is a 1.8 x 3.0 cm low attenuating lesion abutting the right anterior falx with associated mild mass effect on the right frontal lobe and mild vasogenic edema. This may represent a necrotic mass, or an abscess. Further characterization with MRI without and with contrast is recommended. No midline shift. Vascular: No hyperdense vessel or unexpected calcification. Skull: Normal. Negative for fracture or focal lesion. Sinuses/Orbits: No acute finding. Other: None IMPRESSION: 1. Right frontal parafalcine necrotic mass or abscess with associated mild mass effect and edema on the right frontal lobe. Further characterization with MRI without and with contrast is recommended. 2. No acute intracranial hemorrhage. Electronically Signed   By: Anner Crete M.D.   On: 08/30/2020 18:33    Procedures Procedures   Medications Ordered in ED Medications - No data to display  ED Course  I have reviewed the triage vital signs and the nursing notes.  Pertinent labs & imaging results that were available during my care of the patient were reviewed by me and considered in my medical decision making (see chart for details).    MDM Rules/Calculators/A&P                          70 year old lady presents to ER with concern for general fatigue as well as episode of left arm shaking.  Basic labs are  stable.  CT head was concerning for right frontal parafalcine necrotic mass or abscess with mild mass-effect and edema.  Patient is alert, neuro intact and overall well-appearing at present.  Patient will need to be transferred ER to ER to [facility] for MRI.  [facility] accepting. Will discuss with neurosurgery.  Pt requesting POV, she appears stable at present,  will go with daughter.   D/w [facility]. Further recs pending MRI.   Final Clinical Impression(s) / ED Diagnoses Final diagnoses:  Brain mass    Rx / DC Orders ED Discharge Orders    None       Lucrezia Starch, MD 08/30/20 Valerie Roys    Lucrezia Starch, MD 08/30/20 1905

## 2020-08-30 NOTE — ED Notes (Signed)
Kim from patient placement called to see whom accepting doctor is and I advised Dr Gustavus Messing but patient went POV only to get MRI

## 2020-08-30 NOTE — ED Notes (Signed)
Daughter christine Mcpeters [phone] would like to be called when mom has a room and can come to visit

## 2020-08-30 NOTE — ED Provider Notes (Signed)
MSE was initiated and I personally evaluated the patient and placed orders (if any) at  4:32 PM on August 30, 2020.  The patient appears stable so that the remainder of the MSE may be completed by another provider.   Lucrezia Starch, MD 08/30/20 [phone]

## 2020-08-30 NOTE — ED Triage Notes (Addendum)
Pt reports fatigue x 1 week. States she feels like she is "moving slow". Was seen yesterday for tremors. C/o pain to backs of both legs.

## 2020-08-31 NOTE — Progress Notes (Signed)
70 year old female came to [place] after having 2 days of left hand "shaking" and feeling weak overall. She denies any headaches NV dizziness or vision changes. CT head showed a right frontal mass suspicious for lesion. Patient was transferred to [facility] cone for further MRI brain. 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 Patient was still in the waiting room when I came to review the MRI. We discussed the possibility of resection vs biopsy. We also discussed admission vs discharge home with close follow up in the office next week. Patient would like to be discharged home. We will plan to see her in the office next Tuesday. We have given her our name and office number to call on Monday. I will also have my secretary call her as well. We did discuss starting steroids and seizure medications if her symptoms returned. She is going to call me if this happens. The patient actually looks very good upon discussion of her treatment plan. Her symptoms have resolved except for feeling weak but she has no neurologic deficits. She understood our plan of care and is pleased.

## 2020-08-31 NOTE — ED Notes (Signed)
Dr Ronnald Ramp spoke to patient in waiting room and discharged the patient from the waiting room

## 2020-09-03 ENCOUNTER — Other Ambulatory Visit: Payer: Self-pay | Admitting: Neurosurgery

## 2020-09-03 DIAGNOSIS — C711 Malignant neoplasm of frontal lobe: Secondary | ICD-10-CM | POA: Diagnosis not present

## 2020-09-04 ENCOUNTER — Encounter (HOSPITAL_COMMUNITY): Payer: Self-pay | Admitting: *Deleted

## 2020-09-04 NOTE — Progress Notes (Signed)
PCP - None Cardiologist - n/a  Chest x-ray - n/a EKG - 08/30/20 Stress Test - n/a ECHO - n/a Cardiac Cath - n/a  ERAS: Clear liquids til 9:50 am day of surgery  STOP now taking any Aspirin (unless otherwise instructed by your surgeon), Aleve, Naproxen, Ibuprofen, Motrin, Advil, Goody's, BC's, all herbal medications, fish oil, and all vitamins.   Coronavirus Screening Covid test is scheduled on DOS 09/05/20 Do you have any of the following symptoms:  Cough yes/no: No Fever (>100.21F)  yes/no: No Runny nose yes/no: No Sore throat yes/no: No Difficulty breathing/shortness of breath  yes/no: No  Have you traveled in the last 14 days and where? yes/no: No  Patient verbalized understanding of instructions that were given via phone.

## 2020-09-05 ENCOUNTER — Inpatient Hospital Stay (HOSPITAL_COMMUNITY): Payer: Medicare Other | Admitting: Anesthesiology

## 2020-09-05 ENCOUNTER — Other Ambulatory Visit: Payer: Self-pay

## 2020-09-05 ENCOUNTER — Encounter (HOSPITAL_COMMUNITY): Payer: Self-pay | Admitting: Anesthesiology

## 2020-09-05 ENCOUNTER — Encounter (HOSPITAL_COMMUNITY)
Admission: RE | Disposition: A | Discharge status: Rehabilitation Facility | Payer: Self-pay | Source: Home / Self Care | Attending: Neurosurgery

## 2020-09-05 ENCOUNTER — Inpatient Hospital Stay (HOSPITAL_COMMUNITY)
Admission: RE | Admit: 2020-09-05 | Discharge: 2020-09-07 | DRG: 026 | Disposition: A | Discharge status: Rehabilitation Facility | Payer: Medicare Other | Attending: Neurosurgery | Admitting: Neurosurgery

## 2020-09-05 DIAGNOSIS — R220 Localized swelling, mass and lump, head: Secondary | ICD-10-CM | POA: Diagnosis not present

## 2020-09-05 DIAGNOSIS — R32 Unspecified urinary incontinence: Secondary | ICD-10-CM | POA: Diagnosis not present

## 2020-09-05 DIAGNOSIS — D496 Neoplasm of unspecified behavior of brain: Secondary | ICD-10-CM | POA: Diagnosis present

## 2020-09-05 DIAGNOSIS — G8194 Hemiplegia, unspecified affecting left nondominant side: Secondary | ICD-10-CM | POA: Diagnosis present

## 2020-09-05 DIAGNOSIS — E039 Hypothyroidism, unspecified: Secondary | ICD-10-CM | POA: Diagnosis not present

## 2020-09-05 DIAGNOSIS — G40909 Epilepsy, unspecified, not intractable, without status epilepticus: Secondary | ICD-10-CM | POA: Diagnosis not present

## 2020-09-05 DIAGNOSIS — C711 Malignant neoplasm of frontal lobe: Principal | ICD-10-CM | POA: Diagnosis present

## 2020-09-05 DIAGNOSIS — Z79899 Other long term (current) drug therapy: Secondary | ICD-10-CM

## 2020-09-05 DIAGNOSIS — Z20822 Contact with and (suspected) exposure to covid-19: Secondary | ICD-10-CM | POA: Diagnosis not present

## 2020-09-05 DIAGNOSIS — G939 Disorder of brain, unspecified: Secondary | ICD-10-CM | POA: Diagnosis not present

## 2020-09-05 DIAGNOSIS — G8918 Other acute postprocedural pain: Secondary | ICD-10-CM

## 2020-09-05 DIAGNOSIS — R251 Tremor, unspecified: Secondary | ICD-10-CM

## 2020-09-05 DIAGNOSIS — I10 Essential (primary) hypertension: Secondary | ICD-10-CM | POA: Diagnosis present

## 2020-09-05 DIAGNOSIS — Z9889 Other specified postprocedural states: Secondary | ICD-10-CM | POA: Diagnosis not present

## 2020-09-05 DIAGNOSIS — E785 Hyperlipidemia, unspecified: Secondary | ICD-10-CM

## 2020-09-05 DIAGNOSIS — C719 Malignant neoplasm of brain, unspecified: Secondary | ICD-10-CM | POA: Diagnosis not present

## 2020-09-05 DIAGNOSIS — R4189 Other symptoms and signs involving cognitive functions and awareness: Secondary | ICD-10-CM | POA: Diagnosis not present

## 2020-09-05 DIAGNOSIS — Z803 Family history of malignant neoplasm of breast: Secondary | ICD-10-CM | POA: Diagnosis not present

## 2020-09-05 DIAGNOSIS — G936 Cerebral edema: Secondary | ICD-10-CM | POA: Diagnosis not present

## 2020-09-05 HISTORY — PX: CRANIOTOMY: SHX93

## 2020-09-05 HISTORY — DX: Tremor, unspecified: R25.1

## 2020-09-05 HISTORY — PX: APPLICATION OF CRANIAL NAVIGATION: SHX6578

## 2020-09-05 LAB — MRSA PCR SCREENING: MRSA by PCR: NEGATIVE

## 2020-09-05 LAB — BASIC METABOLIC PANEL
Anion gap: 7 (ref 5–15)
BUN: 10 mg/dL (ref 8–23)
CO2: 28 mmol/L (ref 22–32)
Calcium: 9.4 mg/dL (ref 8.9–10.3)
Chloride: 104 mmol/L (ref 98–111)
Creatinine, Ser: 0.73 mg/dL (ref 0.44–1.00)
GFR, Estimated: >60 mL/min (ref >60)
Glucose, Bld: 96 mg/dL (ref 70–99)
Potassium: 3.9 mmol/L (ref 3.5–5.1)
Sodium: 139 mmol/L (ref 135–145)

## 2020-09-05 LAB — ABO/RH: ABO/RH(D): O POS

## 2020-09-05 LAB — SARS CORONAVIRUS 2 BY RT PCR (HOSPITAL ORDER, PERFORMED IN ~~LOC~~ HOSPITAL LAB): SARS Coronavirus 2: NEGATIVE

## 2020-09-05 LAB — TYPE AND SCREEN
ABO/RH(D): O POS
Antibody Screen: NEGATIVE

## 2020-09-05 LAB — CBC
HCT: 39.3 % (ref 36.0–46.0)
Hemoglobin: 13.5 g/dL (ref 12.0–15.0)
MCH: 33.3 pg (ref 26.0–34.0)
MCHC: 34.4 g/dL (ref 30.0–36.0)
MCV: 96.8 fL (ref 80.0–100.0)
Platelets: 312 10*3/uL (ref 150–400)
RBC: 4.06 MIL/uL (ref 3.87–5.11)
RDW: 11.8 % (ref 11.5–15.5)
WBC: 8.8 10*3/uL (ref 4.0–10.5)
nRBC: 0 % (ref 0.0–0.2)

## 2020-09-05 SURGERY — CRANIOTOMY TUMOR EXCISION
Anesthesia: General | Laterality: Right

## 2020-09-05 MED ORDER — EPHEDRINE SULFATE-NACL 50-0.9 MG/10ML-% IV SOSY
PREFILLED_SYRINGE | INTRAVENOUS | Status: DC | PRN
  Administered 2020-09-05: 5 mg via INTRAVENOUS
  Administered 2020-09-05: 10 mg via INTRAVENOUS

## 2020-09-05 MED ORDER — PANTOPRAZOLE SODIUM 40 MG PO TBEC
40.0000 mg | DELAYED_RELEASE_TABLET | Freq: Every day | ORAL | Status: DC
  Administered 2020-09-05 – 2020-09-07 (×3): 40 mg via ORAL
  Filled 2020-09-05 (×3): qty 1

## 2020-09-05 MED ORDER — HYDROCODONE-ACETAMINOPHEN 5-325 MG PO TABS
1.0000 | ORAL_TABLET | ORAL | Status: DC | PRN
  Administered 2020-09-06 – 2020-09-07 (×3): 1 via ORAL
  Filled 2020-09-05 (×3): qty 1

## 2020-09-05 MED ORDER — LIDOCAINE 2% (20 MG/ML) 5 ML SYRINGE
INTRAMUSCULAR | Status: AC
  Filled 2020-09-05: qty 5

## 2020-09-05 MED ORDER — DOCUSATE SODIUM 100 MG PO CAPS
100.0000 mg | ORAL_CAPSULE | Freq: Two times a day (BID) | ORAL | Status: DC
  Administered 2020-09-06 – 2020-09-07 (×3): 100 mg via ORAL
  Filled 2020-09-05 (×4): qty 1

## 2020-09-05 MED ORDER — PHENYLEPHRINE HCL-NACL 10-0.9 MG/250ML-% IV SOLN
INTRAVENOUS | Status: DC | PRN
  Administered 2020-09-05: 50 ug/min via INTRAVENOUS

## 2020-09-05 MED ORDER — SODIUM CHLORIDE 0.9 % IV SOLN
INTRAVENOUS | Status: DC | PRN
  Administered 2020-09-05: 500 mg via INTRAVENOUS

## 2020-09-05 MED ORDER — PROMETHAZINE HCL 25 MG PO TABS
12.5000 mg | ORAL_TABLET | ORAL | Status: DC | PRN
  Filled 2020-09-05: qty 1

## 2020-09-05 MED ORDER — CHLORHEXIDINE GLUCONATE 0.12 % MT SOLN
15.0000 mL | Freq: Once | OROMUCOSAL | Status: AC
  Administered 2020-09-05: 15 mL via OROMUCOSAL
  Filled 2020-09-05: qty 15

## 2020-09-05 MED ORDER — SODIUM CHLORIDE 0.9 % IV SOLN: INTRAVENOUS | Status: DC

## 2020-09-05 MED ORDER — SODIUM CHLORIDE 0.9 % IV SOLN: INTRAVENOUS | Status: DC | PRN

## 2020-09-05 MED ORDER — CEFAZOLIN SODIUM-DEXTROSE 1-4 GM/50ML-% IV SOLN
1.0000 g | Freq: Three times a day (TID) | INTRAVENOUS | Status: AC
  Administered 2020-09-05 – 2020-09-06 (×3): 1 g via INTRAVENOUS
  Filled 2020-09-05 (×4): qty 50

## 2020-09-05 MED ORDER — THROMBIN 20000 UNITS EX SOLR
CUTANEOUS | Status: DC | PRN
  Administered 2020-09-05: 20 mL via TOPICAL

## 2020-09-05 MED ORDER — ORAL CARE MOUTH RINSE: 15.0000 mL | Freq: Once | OROMUCOSAL | Status: AC

## 2020-09-05 MED ORDER — BACITRACIN ZINC 500 UNIT/GM EX OINT
TOPICAL_OINTMENT | CUTANEOUS | Status: AC
  Filled 2020-09-05: qty 28.35

## 2020-09-05 MED ORDER — 0.9 % SODIUM CHLORIDE (POUR BTL) OPTIME
TOPICAL | Status: DC | PRN
  Administered 2020-09-05: 1000 mL
  Administered 2020-09-05: 2000 mL

## 2020-09-05 MED ORDER — ACETAMINOPHEN 325 MG PO TABS
650.0000 mg | ORAL_TABLET | ORAL | Status: DC | PRN
  Administered 2020-09-05: 650 mg via ORAL
  Filled 2020-09-05: qty 2

## 2020-09-05 MED ORDER — MORPHINE SULFATE (PF) 2 MG/ML IV SOLN: 1.0000 mg | INTRAVENOUS | Status: DC | PRN

## 2020-09-05 MED ORDER — LISINOPRIL 10 MG PO TABS
10.0000 mg | ORAL_TABLET | Freq: Every day | ORAL | Status: DC
  Administered 2020-09-06 – 2020-09-07 (×2): 10 mg via ORAL
  Filled 2020-09-05: qty 4
  Filled 2020-09-05: qty 1

## 2020-09-05 MED ORDER — THROMBIN 5000 UNITS EX SOLR
OROMUCOSAL | Status: DC | PRN
  Administered 2020-09-05: 5 mL via TOPICAL

## 2020-09-05 MED ORDER — DEXAMETHASONE 2 MG PO TABS: 2.0000 mg | ORAL_TABLET | Freq: Three times a day (TID) | ORAL | Status: DC

## 2020-09-05 MED ORDER — MANNITOL 25 % IV SOLN
INTRAVENOUS | Status: DC | PRN
  Administered 2020-09-05: 29.5 g via INTRAVENOUS

## 2020-09-05 MED ORDER — ROCURONIUM BROMIDE 10 MG/ML (PF) SYRINGE
PREFILLED_SYRINGE | INTRAVENOUS | Status: AC
  Filled 2020-09-05: qty 10

## 2020-09-05 MED ORDER — ONDANSETRON HCL 4 MG/2ML IJ SOLN: 4.0000 mg | Freq: Once | INTRAMUSCULAR | Status: DC | PRN

## 2020-09-05 MED ORDER — FENTANYL CITRATE (PF) 250 MCG/5ML IJ SOLN
INTRAMUSCULAR | Status: AC
  Filled 2020-09-05: qty 5

## 2020-09-05 MED ORDER — ACETAMINOPHEN 650 MG RE SUPP: 650.0000 mg | RECTAL | Status: DC | PRN

## 2020-09-05 MED ORDER — AMISULPRIDE (ANTIEMETIC) 5 MG/2ML IV SOLN: 5.0000 mg | Freq: Once | INTRAVENOUS | Status: DC | PRN

## 2020-09-05 MED ORDER — ESMOLOL HCL 100 MG/10ML IV SOLN
INTRAVENOUS | Status: DC | PRN
  Administered 2020-09-05 (×2): 20 mg via INTRAVENOUS

## 2020-09-05 MED ORDER — ONDANSETRON HCL 4 MG/2ML IJ SOLN
INTRAMUSCULAR | Status: AC
  Filled 2020-09-05: qty 2

## 2020-09-05 MED ORDER — LEVETIRACETAM 500 MG PO TABS
500.0000 mg | ORAL_TABLET | Freq: Two times a day (BID) | ORAL | Status: DC
  Administered 2020-09-05 – 2020-09-07 (×4): 500 mg via ORAL
  Filled 2020-09-05 (×4): qty 1

## 2020-09-05 MED ORDER — ROCURONIUM BROMIDE 10 MG/ML (PF) SYRINGE
PREFILLED_SYRINGE | INTRAVENOUS | Status: DC | PRN
  Administered 2020-09-05: 40 mg via INTRAVENOUS
  Administered 2020-09-05 (×2): 30 mg via INTRAVENOUS

## 2020-09-05 MED ORDER — ONDANSETRON HCL 4 MG/2ML IJ SOLN: 4.0000 mg | INTRAMUSCULAR | Status: DC | PRN

## 2020-09-05 MED ORDER — FLEET ENEMA 7-19 GM/118ML RE ENEM: 1.0000 | ENEMA | Freq: Once | RECTAL | Status: DC | PRN

## 2020-09-05 MED ORDER — ONDANSETRON HCL 4 MG PO TABS: 4.0000 mg | ORAL_TABLET | ORAL | Status: DC | PRN

## 2020-09-05 MED ORDER — LIDOCAINE-EPINEPHRINE 1 %-1:100000 IJ SOLN
INTRAMUSCULAR | Status: DC | PRN
  Administered 2020-09-05: 7 mL

## 2020-09-05 MED ORDER — ENALAPRILAT 1.25 MG/ML IV SOLN
1.2500 mg | Freq: Four times a day (QID) | INTRAVENOUS | Status: DC | PRN
  Filled 2020-09-05: qty 1

## 2020-09-05 MED ORDER — AMISULPRIDE (ANTIEMETIC) 5 MG/2ML IV SOLN
INTRAVENOUS | Status: AC
  Filled 2020-09-05: qty 2

## 2020-09-05 MED ORDER — THROMBIN 20000 UNITS EX SOLR
CUTANEOUS | Status: AC
  Filled 2020-09-05: qty 20000

## 2020-09-05 MED ORDER — LEVETIRACETAM IN NACL 500 MG/100ML IV SOLN
500.0000 mg | INTRAVENOUS | Status: DC
  Filled 2020-09-05: qty 100

## 2020-09-05 MED ORDER — BACITRACIN ZINC 500 UNIT/GM EX OINT
TOPICAL_OINTMENT | CUTANEOUS | Status: DC | PRN
  Administered 2020-09-05 (×2): 1 via TOPICAL

## 2020-09-05 MED ORDER — ACETAMINOPHEN 500 MG PO TABS: 1000.0000 mg | ORAL_TABLET | Freq: Once | ORAL | Status: AC

## 2020-09-05 MED ORDER — FENTANYL CITRATE (PF) 100 MCG/2ML IJ SOLN: 25.0000 ug | INTRAMUSCULAR | Status: DC | PRN

## 2020-09-05 MED ORDER — DEXAMETHASONE 2 MG PO TABS: 1.0000 mg | ORAL_TABLET | Freq: Two times a day (BID) | ORAL | Status: DC

## 2020-09-05 MED ORDER — PROPOFOL 10 MG/ML IV BOLUS
INTRAVENOUS | Status: DC | PRN
  Administered 2020-09-05: 30 mg via INTRAVENOUS
  Administered 2020-09-05: 40 mg via INTRAVENOUS
  Administered 2020-09-05: 100 mg via INTRAVENOUS

## 2020-09-05 MED ORDER — LABETALOL HCL 5 MG/ML IV SOLN: 10.0000 mg | INTRAVENOUS | Status: DC | PRN

## 2020-09-05 MED ORDER — SUGAMMADEX SODIUM 200 MG/2ML IV SOLN
INTRAVENOUS | Status: DC | PRN
  Administered 2020-09-05 (×2): 100 mg via INTRAVENOUS

## 2020-09-05 MED ORDER — ONDANSETRON HCL 4 MG/2ML IJ SOLN
INTRAMUSCULAR | Status: DC | PRN
  Administered 2020-09-05: 4 mg via INTRAVENOUS

## 2020-09-05 MED ORDER — DEXAMETHASONE SODIUM PHOSPHATE 10 MG/ML IJ SOLN
INTRAMUSCULAR | Status: AC
  Filled 2020-09-05: qty 1

## 2020-09-05 MED ORDER — CHLORHEXIDINE GLUCONATE CLOTH 2 % EX PADS
6.0000 | MEDICATED_PAD | Freq: Every day | CUTANEOUS | Status: DC
  Administered 2020-09-05 – 2020-09-07 (×2): 6 via TOPICAL

## 2020-09-05 MED ORDER — ACETAMINOPHEN 500 MG PO TABS
ORAL_TABLET | ORAL | Status: AC
  Administered 2020-09-05: 1000 mg via ORAL
  Filled 2020-09-05: qty 2

## 2020-09-05 MED ORDER — PHENYLEPHRINE 40 MCG/ML (10ML) SYRINGE FOR IV PUSH (FOR BLOOD PRESSURE SUPPORT)
PREFILLED_SYRINGE | INTRAVENOUS | Status: DC | PRN
  Administered 2020-09-05 (×2): 80 ug via INTRAVENOUS

## 2020-09-05 MED ORDER — CEFAZOLIN SODIUM-DEXTROSE 2-3 GM-%(50ML) IV SOLR
INTRAVENOUS | Status: DC | PRN
  Administered 2020-09-05: 2 g via INTRAVENOUS

## 2020-09-05 MED ORDER — AMISULPRIDE (ANTIEMETIC) 5 MG/2ML IV SOLN
INTRAVENOUS | Status: DC | PRN
  Administered 2020-09-05: 5 mg via INTRAVENOUS

## 2020-09-05 MED ORDER — DEXAMETHASONE 4 MG PO TABS
4.0000 mg | ORAL_TABLET | Freq: Three times a day (TID) | ORAL | Status: DC
  Filled 2020-09-05: qty 1

## 2020-09-05 MED ORDER — DEXAMETHASONE 2 MG PO TABS: 1.0000 mg | ORAL_TABLET | Freq: Every day | ORAL | Status: DC

## 2020-09-05 MED ORDER — DEXAMETHASONE SODIUM PHOSPHATE 10 MG/ML IJ SOLN
INTRAMUSCULAR | Status: DC | PRN
  Administered 2020-09-05: 10 mg via INTRAVENOUS

## 2020-09-05 MED ORDER — FENTANYL CITRATE (PF) 250 MCG/5ML IJ SOLN
INTRAMUSCULAR | Status: DC | PRN
  Administered 2020-09-05 (×2): 50 ug via INTRAVENOUS
  Administered 2020-09-05: 150 ug via INTRAVENOUS
  Administered 2020-09-05: 50 ug via INTRAVENOUS

## 2020-09-05 MED ORDER — THROMBIN 5000 UNITS EX SOLR
CUTANEOUS | Status: AC
  Filled 2020-09-05: qty 5000

## 2020-09-05 MED ORDER — POLYETHYLENE GLYCOL 3350 17 G PO PACK: 17.0000 g | PACK | Freq: Every day | ORAL | Status: DC | PRN

## 2020-09-05 MED ORDER — DEXAMETHASONE 4 MG PO TABS
4.0000 mg | ORAL_TABLET | Freq: Four times a day (QID) | ORAL | Status: AC
  Administered 2020-09-05 – 2020-09-07 (×8): 4 mg via ORAL
  Filled 2020-09-05 (×8): qty 1

## 2020-09-05 MED ORDER — DEXAMETHASONE 2 MG PO TABS: 2.0000 mg | ORAL_TABLET | Freq: Two times a day (BID) | ORAL | Status: DC

## 2020-09-05 MED ORDER — LIDOCAINE-EPINEPHRINE 1 %-1:100000 IJ SOLN
INTRAMUSCULAR | Status: AC
  Filled 2020-09-05: qty 1

## 2020-09-05 MED ORDER — HEMOSTATIC AGENTS (NO CHARGE) OPTIME
TOPICAL | Status: DC | PRN
  Administered 2020-09-05: 1 via TOPICAL

## 2020-09-05 MED ORDER — LIDOCAINE 2% (20 MG/ML) 5 ML SYRINGE
INTRAMUSCULAR | Status: DC | PRN
  Administered 2020-09-05: 60 mg via INTRAVENOUS

## 2020-09-05 SURGICAL SUPPLY — 98 items
APL SKNCLS STERI-STRIP NONHPOA (GAUZE/BANDAGES/DRESSINGS)
BAND INSRT 18 STRL LF DISP RB (MISCELLANEOUS) ×2
BAND RUBBER #18 3X1/16 STRL (MISCELLANEOUS) ×4 IMPLANT
BENZOIN TINCTURE PRP APPL 2/3 (GAUZE/BANDAGES/DRESSINGS) IMPLANT
BIT DRILL WIRE PASS 1.3MM (BIT) ×1 IMPLANT
BLADE CLIPPER SURG (BLADE) ×2 IMPLANT
BLADE SAW GIGLI 16 STRL (MISCELLANEOUS) IMPLANT
BLADE SURG 11 STRL SS (BLADE) ×2 IMPLANT
BLADE SURG 15 STRL LF DISP TIS (BLADE) IMPLANT
BLADE SURG 15 STRL SS (BLADE)
BNDG CMPR 75X41 PLY HI ABS (GAUZE/BANDAGES/DRESSINGS) ×2
BNDG GAUZE ELAST 4 BULKY (GAUZE/BANDAGES/DRESSINGS) ×4 IMPLANT
BNDG STRETCH 4X75 STRL LF (GAUZE/BANDAGES/DRESSINGS) ×4 IMPLANT
BUR ACORN 9.0 PRECISION (BURR) ×2 IMPLANT
BUR ROUND FLUTED 4 SOFT TCH (BURR) IMPLANT
BUR SPIRAL ROUTER 2.3 (BUR) ×2 IMPLANT
CANISTER SUCT 3000ML PPV (MISCELLANEOUS) ×4 IMPLANT
CATH VENTRIC 35X38 W/TROCAR LG (CATHETERS) IMPLANT
CLIP VESOCCLUDE MED 6/CT (CLIP) IMPLANT
CNTNR URN SCR LID CUP LEK RST (MISCELLANEOUS) ×1 IMPLANT
CONT SPEC 4OZ STRL OR WHT (MISCELLANEOUS) ×2
COVER BURR HOLE UNIV 10 (Orthopedic Implant) ×6 IMPLANT
COVER MAYO STAND STRL (DRAPES) ×2 IMPLANT
COVER WAND RF STERILE (DRAPES) ×2 IMPLANT
DECANTER SPIKE VIAL GLASS SM (MISCELLANEOUS) ×2 IMPLANT
DRAIN SUBARACHNOID (WOUND CARE) IMPLANT
DRAPE HALF SHEET 40X57 (DRAPES) ×2 IMPLANT
DRAPE MICROSCOPE LEICA (MISCELLANEOUS) ×2 IMPLANT
DRAPE NEUROLOGICAL W/INCISE (DRAPES) ×2 IMPLANT
DRAPE STERI IOBAN 125X83 (DRAPES) IMPLANT
DRAPE SURG 17X23 STRL (DRAPES) IMPLANT
DRAPE WARM FLUID 44X44 (DRAPES) ×2 IMPLANT
DRILL WIRE PASS 1.3MM (BIT) ×2
DRSG ADAPTIC 3X8 NADH LF (GAUZE/BANDAGES/DRESSINGS) IMPLANT
DRSG AQUACEL AG ADV 3.5X 6 (GAUZE/BANDAGES/DRESSINGS) IMPLANT
DRSG TELFA 3X8 NADH (GAUZE/BANDAGES/DRESSINGS) IMPLANT
DURAPREP 6ML APPLICATOR 50/CS (WOUND CARE) ×2 IMPLANT
ELECT COATED BLADE 2.86 ST (ELECTRODE) ×2 IMPLANT
ELECT REM PT RETURN 9FT ADLT (ELECTROSURGICAL) ×2
ELECTRODE REM PT RTRN 9FT ADLT (ELECTROSURGICAL) ×1 IMPLANT
EVACUATOR 1/8 PVC DRAIN (DRAIN) IMPLANT
EVACUATOR SILICONE 100CC (DRAIN) IMPLANT
FORCEPS BIPO MALIS IRRIG 9X1.5 (NEUROSURGERY SUPPLIES) ×2 IMPLANT
GAUZE 4X4 16PLY RFD (DISPOSABLE) IMPLANT
GAUZE SPONGE 4X4 12PLY STRL (GAUZE/BANDAGES/DRESSINGS) IMPLANT
GAUZE XEROFORM 1X8 LF (GAUZE/BANDAGES/DRESSINGS) IMPLANT
GLOVE BIO SURGEON STRL SZ7.5 (GLOVE) ×2 IMPLANT
GLOVE BIOGEL PI IND STRL 7.5 (GLOVE) ×1 IMPLANT
GLOVE BIOGEL PI INDICATOR 7.5 (GLOVE) ×1
GLOVE ECLIPSE 7.5 STRL STRAW (GLOVE) IMPLANT
GLOVE EXAM NITRILE LRG STRL (GLOVE) IMPLANT
GLOVE EXAM NITRILE XL STR (GLOVE) IMPLANT
GOWN STRL REUS W/ TWL LRG LVL3 (GOWN DISPOSABLE) ×2 IMPLANT
GOWN STRL REUS W/ TWL XL LVL3 (GOWN DISPOSABLE) IMPLANT
GOWN STRL REUS W/TWL 2XL LVL3 (GOWN DISPOSABLE) IMPLANT
GOWN STRL REUS W/TWL LRG LVL3 (GOWN DISPOSABLE) ×4
GOWN STRL REUS W/TWL XL LVL3 (GOWN DISPOSABLE)
GRAFT DURAGEN MATRIX 2WX2L ×2 IMPLANT
HEMOSTAT POWDER KIT SURGIFOAM (HEMOSTASIS) ×2 IMPLANT
HEMOSTAT SURGICEL 2X14 (HEMOSTASIS) ×2 IMPLANT
HEMOSTAT SURGICEL 2X4 FIBR (HEMOSTASIS) IMPLANT
HOOK DURA 1/2IN (MISCELLANEOUS) IMPLANT
IV NS 1000ML (IV SOLUTION) ×2
IV NS 1000ML BAXH (IV SOLUTION) ×1 IMPLANT
KIT BASIN OR (CUSTOM PROCEDURE TRAY) ×2 IMPLANT
KIT DRAIN CSF ACCUDRAIN (MISCELLANEOUS) IMPLANT
KIT TURNOVER KIT B (KITS) ×2 IMPLANT
MARKER SPHERE PSV REFLC 13MM (MARKER) ×8 IMPLANT
NEEDLE HYPO 22GX1.5 SAFETY (NEEDLE) ×2 IMPLANT
NEEDLE SPNL 18GX3.5 QUINCKE PK (NEEDLE) IMPLANT
NS IRRIG 1000ML POUR BTL (IV SOLUTION) ×6 IMPLANT
PACK CRANIOTOMY CUSTOM (CUSTOM PROCEDURE TRAY) ×2 IMPLANT
PATTIES SURGICAL .25X.25 (GAUZE/BANDAGES/DRESSINGS) IMPLANT
PATTIES SURGICAL .5 X.5 (GAUZE/BANDAGES/DRESSINGS) ×2 IMPLANT
PATTIES SURGICAL .5 X3 (DISPOSABLE) ×2 IMPLANT
PATTIES SURGICAL 1/4 X 3 (GAUZE/BANDAGES/DRESSINGS) IMPLANT
PATTIES SURGICAL 1X1 (DISPOSABLE) IMPLANT
PIN MAYFIELD SKULL DISP (PIN) ×2 IMPLANT
SCREW UNIII AXS SD 1.5X4 (Screw) ×6 IMPLANT
SCREW UNIII AXS SD 1.5X5 (Screw) ×24 IMPLANT
SET TUBING IRRIGATION DISP (TUBING) ×2 IMPLANT
SPONGE NEURO XRAY DETECT 1X3 (DISPOSABLE) IMPLANT
SPONGE SURGIFOAM ABS GEL 100 (HEMOSTASIS) ×2 IMPLANT
STAPLER VISISTAT 35W (STAPLE) ×2 IMPLANT
SUT ETHILON 3 0 FSL (SUTURE) IMPLANT
SUT ETHILON 3 0 PS 1 (SUTURE) IMPLANT
SUT MNCRL AB 3-0 PS2 18 (SUTURE) IMPLANT
SUT NURALON 4 0 TR CR/8 (SUTURE) ×4 IMPLANT
SUT SILK 0 TIES 10X30 (SUTURE) IMPLANT
SUT VIC AB 2-0 CP2 18 (SUTURE) ×4 IMPLANT
SUT VICRYL RAPIDE 3/0 (SUTURE) ×2 IMPLANT
TOWEL GREEN STERILE (TOWEL DISPOSABLE) ×2 IMPLANT
TOWEL GREEN STERILE FF (TOWEL DISPOSABLE) ×2 IMPLANT
TRAY FOLEY MTR SLVR 16FR STAT (SET/KITS/TRAYS/PACK) ×2 IMPLANT
TUBE CONNECTING 12X1/4 (SUCTIONS) ×2 IMPLANT
TUBE CONNECTING 20X1/4 (TUBING) ×2 IMPLANT
UNDERPAD 30X36 HEAVY ABSORB (UNDERPADS AND DIAPERS) ×2 IMPLANT
WATER STERILE IRR 1000ML POUR (IV SOLUTION) ×2 IMPLANT

## 2020-09-05 NOTE — Op Note (Signed)
Procedure(s): Craniotomy - right Frontal interhemispheric approach for tumor resection with BRAIN LAB APPLICATION OF CRANIAL NAVIGATION Procedure Note  Darlene Marsh female 70 y.o. 09/05/2020  Procedure(s) and Anesthesia Type:    * Craniotomy - right Frontal interhemispheric approach for tumor resection with BRAIN LAB - General    * APPLICATION OF CRANIAL NAVIGATION - General  Surgeon(s) and Role:    Marcello Moores, Dorcas Carrow, MD - Primary   Indications: This is a 70 year old woman who presented with left-sided motor seizures as well as progressive cognitive difficulties and was found to have a large enhancing and cystic right posterior superior frontal gyrus mass.  Craniotomy for resection for purposes of diagnosis as well as potential therapeutic benefit was recommended.  Risks, benefits, alternatives, and expected convalescence were discussed with the patient and her family.  Risks discussed included, but were not limited to, bleeding, pain, infection, seizure, scar, stroke, neurologic deficit, recurrence, coma, and death.  The possibility of SMA syndrome was discussed as part of the expected convalescence.  Informed consent was obtained.  Surgeon: Vallarie Mare   Assistants: None  Anesthesia: General endotracheal anesthesia  Procedure Detail  Craniotomy - right Frontal interhemispheric approach for tumor resection with BRAIN LAB, APPLICATION OF CRANIAL NAVIGATION 2. Use of microscope for microdissection  Patient was brought to the operating room.  General anesthesia was induced and patient was intubated by the anesthesia service.  After appropriate lines and monitors were placed, patient was positioned supine on the OR table and the head was fixed in a Mayfield head holder.  The head was then fixed in a slightly flexed position.  A preoperative thin cut MRI and CT was reconstructed into a 3D image.  This allowed for surface match registration with the use of the BrainLab navigation  system.  Neuro navigation was then used to plan a linear incision over the coronal suture crossing midline.  A small area of scalp was clipped, preprepped with alcohol and prepped and draped in sterile fashion.  1% lidocaine with epinephrine was injected into the skin.  A timeout was performed and preoperative antibiotics, dexamethasone, and mannitol were given.  Incision was made with a 10 blade and the galea and periosteum were opened sharply.  The periosteum was reflected off of the skull and self-retaining retractor was used.  The bregma and the sagittal suture as well as coronal suture were identified.  Bur holes were placed over the sagittal sinus and one laterally which were used to dissect the dura from the inner table of the skull.  Craniotome was used to perform craniotomy.  Meticulous epidural hemostasis was obtained.  A strip of Gelfoam was placed over the sinus.  Dura was opened in C-shaped manner to the sagittal sinus and flapped medially.  Microscope was then introduced in the field to allow for intraoperative microdissection. The interhemispheric fissure was entered and the frontal lobe was dissected from the falx, cutting small arachnoid bands.  Area of abnormality was seen along the interhemispheric pial surface of the frontal lobe.  Good brain relaxation was achieved.  En passage anterior communicating artery branches were identified and protected.   There was a small arterialized vein draining the mass.  The pia was coagulated at the junction of normal brain with the abnormal area and the pia was sharply entered.  In order to help decompress the brain and allow for circumferential dissection, the cystic portion of the mass was entered and fluid was released which resulted in significant brain relaxation.  The mass appeared purplish and was distinguished from the surrounding brain, though there was no clear palpable capsule.  Circumferential dissection was performed, first anteriorly, then  posteriorly, and at the deep aspect and superficial aspect and then finally the lateral aspect was dissected.  Small anterior communicating artery feeders were coagulated and cut sharply the tumor was fairly soft so some of the tumor was aspirated and the suction but it was essentially removed in 1 piece and sent for frozen section.  This resulted in glial tissue with areas of necrosis.  The cavity was closely inspected and no clearly abnormal tissue was seen.  Meticulous hemostasis was then obtained in the cavity.  The dura was quite thin and was tearing with stitching so it was closed with a DuraGen plus inlay and several interrupted Nurolon stitches.  Meticulous epidural hemostasis was obtained.  The bone flap was replaced with Stryker cranial plating system.  The wound was irrigated thoroughly.  The skin was closed with 2-0 Vicryl stitches in buried fashion followed by 3-0 Vicryl Rapide.  Bacitracin was placed over the incision and the head was wrapped after removal from the Mayfield head holder.  Patient was then extubated by the anesthesia service.  All counts were correct at the end of surgery.  No complications were noted.    Findings: Partially cystic, malignant appearing mass that appeared to blend in with the surrounding brain.  Associated thrombosed veins were also seen.  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  Estimated Blood Loss:  less than 50 mL         Drains: None         Total IV Fluids: See anesthesia records   Blood Given: None         Specimens: Right frontal lobe tumor         Implants: Stryker cranial plating system        Complications:  * No complications entered in OR log *         Disposition: PACU - hemodynamically stable.         Condition: stable

## 2020-09-05 NOTE — Anesthesia Procedure Notes (Signed)
Arterial Line Insertion Start/End4/11/2020 12:00 PM, 09/05/2020 12:15 PM Performed by: CRNA  Patient location: Pre-op. Preanesthetic checklist: patient identified, IV checked, site marked, risks and benefits discussed, surgical consent, monitors and equipment checked, pre-op evaluation, timeout performed and anesthesia consent Lidocaine 1% used for infiltration Left, radial was placed Catheter size: 20 G Hand hygiene performed  and maximum sterile barriers used   Attempts: 1 Procedure performed without using ultrasound guided technique. Following insertion, dressing applied and Biopatch. Post procedure assessment: normal and unchanged

## 2020-09-05 NOTE — Anesthesia Preprocedure Evaluation (Addendum)
Anesthesia Evaluation  Patient identified by MRN, date of birth, ID band Patient awake    Reviewed: Allergy & Precautions, NPO status , Patient's Chart, lab work & pertinent test results  Airway Mallampati: II  TM Distance: >3 FB Neck ROM: Full    Dental  (+) Poor Dentition, Chipped, Partial Upper,    Pulmonary neg pulmonary ROS,    Pulmonary exam normal breath sounds clear to auscultation       Cardiovascular hypertension, Pt. on medications Normal cardiovascular exam Rhythm:Regular Rate:Normal  ECG: NSR, rate 70   Neuro/Psych Partially cystic mass of the superior paramedian right hemisphere with nodular peripheral contrast enhancement at its superior aspect. Moderate edema in the adjacent brain. negative psych ROS   GI/Hepatic negative GI ROS, Neg liver ROS,   Endo/Other  Hypothyroidism   Renal/GU negative Renal ROS     Musculoskeletal negative musculoskeletal ROS (+)   Abdominal   Peds  Hematology HLD   Anesthesia Other Findings Brain Tumor  Reproductive/Obstetrics                            Anesthesia Physical Anesthesia Plan  ASA: II  Anesthesia Plan: General   Post-op Pain Management:    Induction: Intravenous  PONV Risk Score and Plan: 3 and Ondansetron, Dexamethasone, Treatment may vary due to age or medical condition and Amisulpride  Airway Management Planned: Oral ETT  Additional Equipment: Arterial line  Intra-op Plan:   Post-operative Plan: Extubation in OR  Informed Consent: I have reviewed the patients History and Physical, chart, labs and discussed the procedure including the risks, benefits and alternatives for the proposed anesthesia with the patient or authorized representative who has indicated his/her understanding and acceptance.     Dental advisory given  Plan Discussed with: CRNA  Anesthesia Plan Comments:        Anesthesia Quick  Evaluation

## 2020-09-05 NOTE — Transfer of Care (Signed)
Immediate Anesthesia Transfer of Care Note  Patient: Darlene Marsh  Procedure(s) Performed: Craniotomy - right Frontal interhemispheric approach for tumor resection with BRAIN LAB (Right ) APPLICATION OF CRANIAL NAVIGATION (Right )  Patient Location: PACU  Anesthesia Type:General  Level of Consciousness: drowsy, patient cooperative and responds to stimulation  Airway & Oxygen Therapy: Patient Spontanous Breathing and Patient connected to face mask oxygen  Post-op Assessment: Report given to RN, Post -op Vital signs reviewed and stable and Patient moving all extremities  Post vital signs: Reviewed and stable  Last Vitals:  Vitals Value Taken Time  BP 119/60 09/05/20 1640  Temp    Pulse 78 09/05/20 1641  Resp 12 09/05/20 1641  SpO2 100 % 09/05/20 1641  Vitals shown include unvalidated device data.  Last Pain:  Vitals:   09/05/20 1125  TempSrc:   PainSc: 0-No pain         Complications: No complications documented.

## 2020-09-05 NOTE — Anesthesia Postprocedure Evaluation (Signed)
Anesthesia Post Note  Patient: Terrah Decoster  Procedure(s) Performed: Craniotomy - right Frontal interhemispheric approach for tumor resection with BRAIN LAB (Right ) APPLICATION OF CRANIAL NAVIGATION (Right )     Patient location during evaluation: PACU Anesthesia Type: General Level of consciousness: awake Pain management: pain level controlled Vital Signs Assessment: post-procedure vital signs reviewed and stable Respiratory status: spontaneous breathing, nonlabored ventilation, respiratory function stable and patient connected to nasal cannula oxygen Cardiovascular status: blood pressure returned to baseline and stable Postop Assessment: no apparent nausea or vomiting Anesthetic complications: no   No complications documented.  Last Vitals:  Vitals:   09/05/20 1900 09/05/20 2000  BP: 104/79   Pulse: 87   Resp: 20   Temp:  36.6 C  SpO2: 96%     Last Pain:  Vitals:   09/05/20 2000  TempSrc: Oral  PainSc:                  Karyl Kinnier Islam Villescas

## 2020-09-05 NOTE — H&P (Signed)
CC: seizures, weakness  HPI:     Patient is a 70 y.o. female presented with left sided motor seizures and difficulties with using her left arm and leg, as well as cognitive decline.  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  She has not had seizures since starting Keppra.  Hx of breast cancer in mother and sister.  She has had some intended weight loss over past 2 years, no cough, GI symptoms.    Patient Active Problem List   Diagnosis Date Noted  . Essential hypertension 01/05/2018  . Hypothyroidism 08/23/2017  . Hyperlipidemia 08/23/2017   Past Medical History:  Diagnosis Date  . Hyperlipidemia   . Hypertension   . Thyroid disease    underactive  . Tremors of nervous system     History reviewed. No pertinent surgical history.  Medications Prior to Admission  Medication Sig Dispense Refill Last Dose  . ibuprofen (ADVIL) 200 MG tablet Take 200 mg by mouth every 6 (six) hours as needed for moderate pain or mild pain.   Past Week at Unknown time  . levETIRAcetam (KEPPRA) 500 MG tablet Take 500 mg by mouth 2 (two) times daily.   09/05/2020 at 0700  . lisinopril (ZESTRIL) 10 MG tablet TAKE 1 TABLET(10 MG) BY MOUTH DAILY (Patient taking differently: Take 10 mg by mouth daily.) 30 tablet 3 09/05/2020 at 0700  . methylPREDNISolone (MEDROL DOSEPAK) 4 MG TBPK tablet Take 4 mg by mouth 2 (two) times daily. follow package directions     . COVID-19 Ad26 vaccine, JANSSEN/J&J, 0.5 ML injection INJECT AS DIRECTED .5 mL 0    No Known Allergies  Social History   Tobacco Use  . Smoking status: Never Smoker  . Smokeless tobacco: Never Used  Substance Use Topics  . Alcohol use: Never    Family History  Problem Relation Age of Onset  . Cancer Mother        Breast cancer  . Cancer Sister        Breast  . Cancer Sister        Breast     Review of Systems Pertinent items noted in HPI and remainder of comprehensive ROS otherwise  negative.  Objective:   Patient Vitals for the past 8 hrs:  BP Temp Temp src Pulse Resp SpO2 Height Weight  09/05/20 1025 120/61 98.4 F (36.9 C) Oral 68 17 99 % 5\' 6"  (1.676 m) 59 kg   No intake/output data recorded. No intake/output data recorded.      General : Alert, cooperative, no distress, appears stated age   Head:  Normocephalic/atraumatic    Eyes: PERRL, conjunctiva/corneas clear, EOM's intact. Fundi could not be visualized Neck: Supple Chest:  Respirations unlabored Chest wall: no tenderness or deformity Heart: Regular rate and rhythm Abdomen: Soft, nontender and nondistended Extremities: warm and well-perfused Skin: normal turgor, color and texture Neurologic:  Alert, oriented x 3.  Slow speech initiation, cognitively slowed. Eyes open spontaneously. PERRL, EOMI, VFC, no facial droop. V1-3 intact.  No dysarthria, tongue protrusion symmetric.  CNII-XII intact. Normal strength, sensation and reflexes throughout.  Left pronator drift, 4/5 in left leg.  Bilateral dysmetria, left dysdiadochokinesia.       Data ReviewRadiology review: see HPI  Assessment:   Right frontal brain mass, likely malignant  Plan:   - plan for surgery today for diagnosis and potential oncologic benefit - risks, benefits, alternatives, and expected convalescence, including likelihood of SMA syndrome  discussed with patient's family. They wished to proceed.

## 2020-09-05 NOTE — Anesthesia Procedure Notes (Signed)
Procedure Name: Intubation Date/Time: 09/05/2020 1:11 PM Performed by: Rande Brunt, CRNA Pre-anesthesia Checklist: Patient identified, Emergency Drugs available, Suction available and Patient being monitored Patient Re-evaluated:Patient Re-evaluated prior to induction Oxygen Delivery Method: Circle System Utilized Preoxygenation: Pre-oxygenation with 100% oxygen Induction Type: IV induction Ventilation: Mask ventilation without difficulty Laryngoscope Size: Mac and 3 Grade View: Grade II Tube type: Oral Number of attempts: 1 Airway Equipment and Method: Stylet and Oral airway Placement Confirmation: ETT inserted through vocal cords under direct vision,  positive ETCO2 and breath sounds checked- equal and bilateral Secured at: 21 cm Tube secured with: Tape Dental Injury: Teeth and Oropharynx as per pre-operative assessment  Comments: Limited mouth opening

## 2020-09-06 ENCOUNTER — Other Ambulatory Visit: Payer: Self-pay | Admitting: Radiation Therapy

## 2020-09-06 ENCOUNTER — Encounter (HOSPITAL_COMMUNITY): Payer: Self-pay | Admitting: Neurosurgery

## 2020-09-06 ENCOUNTER — Inpatient Hospital Stay (HOSPITAL_COMMUNITY): Payer: Medicare Other

## 2020-09-06 DIAGNOSIS — E785 Hyperlipidemia, unspecified: Secondary | ICD-10-CM | POA: Diagnosis not present

## 2020-09-06 DIAGNOSIS — R251 Tremor, unspecified: Secondary | ICD-10-CM

## 2020-09-06 DIAGNOSIS — G8918 Other acute postprocedural pain: Secondary | ICD-10-CM

## 2020-09-06 DIAGNOSIS — D496 Neoplasm of unspecified behavior of brain: Secondary | ICD-10-CM | POA: Diagnosis not present

## 2020-09-06 DIAGNOSIS — I10 Essential (primary) hypertension: Secondary | ICD-10-CM

## 2020-09-06 MED ORDER — GADOBUTROL 1 MMOL/ML IV SOLN
5.5000 mL | Freq: Once | INTRAVENOUS | Status: AC | PRN
  Administered 2020-09-06: 5.5 mL via INTRAVENOUS

## 2020-09-06 NOTE — Consult Note (Signed)
Physical Medicine and Rehabilitation Consult Reason for Consult.  Left side weakness with cognitive decline Referring Physician: Dr. Duffy Rhody  HPI: Darlene Marsh is a 70 y.o. right-handed female with history of hyperlipidemia, hypertension, tremors.  History taken from chart review, daughter, husband, and patient.  Patient lives with spouse.  Independent prior to admission.  1 level home 3 steps to entry with bilateral railings.  She presented on 09/05/2020 with left-sided motor seizures and difficulty using her left arm and leg.  She was placed on Keppra. 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  Patient underwent right frontal interhemispheric tumor resection craniotomy on 09/05/2020 per Dr. Duffy Rhody.  Decadron protocol as indicated.  She remains on Keppra for seizure disorder.  Maintain on a regular diet.  Therapy evaluations completed due to patient's left-sided hemiparesis and tremors recommendations of physical medicine rehab consult.  Review of Systems  Constitutional: Negative for chills and fever.  HENT: Negative for hearing loss.   Eyes: Negative for blurred vision and double vision.  Respiratory: Negative for cough and shortness of breath.   Cardiovascular: Negative for chest pain, palpitations and leg swelling.  Gastrointestinal: Positive for constipation. Negative for heartburn, nausea and vomiting.  Genitourinary: Negative for dysuria, flank pain and hematuria.  Musculoskeletal: Negative for joint pain and myalgias.  Skin: Negative for rash.  Neurological: Positive for dizziness, tremors, focal weakness, weakness and headaches.  Psychiatric/Behavioral: The patient has insomnia.   All other systems reviewed and are negative.  Past Medical History:  Diagnosis Date  . Hyperlipidemia   . Hypertension   . Thyroid disease    underactive  . Tremors of nervous system    Past Surgical History:  Procedure  Laterality Date  . APPLICATION OF CRANIAL NAVIGATION Right 09/05/2020   Procedure: APPLICATION OF CRANIAL NAVIGATION;  Surgeon: Vallarie Mare, MD;  Location: [facility];  Service: Neurosurgery;  Laterality: Right;  . CRANIOTOMY Right 09/05/2020   Procedure: Craniotomy - right Frontal interhemispheric approach for tumor resection with BRAIN LAB;  Surgeon: Vallarie Mare, MD;  Location: [facility];  Service: Neurosurgery;  Laterality: Right;   Family History  Problem Relation Age of Onset  . Cancer Mother        Breast cancer  . Cancer Sister        Breast  . Cancer Sister        Breast   Social History:  reports that she has never smoked. She has never used smokeless tobacco. She reports that she does not drink alcohol and does not use drugs. Allergies: No Known Allergies Medications Prior to Admission  Medication Sig Dispense Refill  . ibuprofen (ADVIL) 200 MG tablet Take 200 mg by mouth every 6 (six) hours as needed for moderate pain or mild pain.    Marland Kitchen levETIRAcetam (KEPPRA) 500 MG tablet Take 500 mg by mouth 2 (two) times daily.    Marland Kitchen lisinopril (ZESTRIL) 10 MG tablet TAKE 1 TABLET(10 MG) BY MOUTH DAILY (Patient taking differently: Take 10 mg by mouth daily.) 30 tablet 3  . methylPREDNISolone (MEDROL DOSEPAK) 4 MG TBPK tablet Take 4 mg by mouth 2 (two) times daily. follow package directions    . COVID-19 Ad26 vaccine, JANSSEN/J&J, 0.5 ML injection INJECT AS DIRECTED .5 mL 0    Home: Home Living Family/patient expects to be discharged to:: Private residence Living Arrangements: Spouse/significant other Available Help at Discharge: Family,Available 24 hours/day Type of  Home: House Home Access: Stairs to enter [employer] of Steps: 4 Entrance Stairs-Rails: Left Home Layout: One level Bathroom Shower/Tub: Multimedia programmer: Handicapped height Home Equipment: Other (comment) (can install toilet and shower grab bars if needed)  Functional History: Prior  Function Level of Independence: Independent Comments: Pt loves to dance with her husband and make [employer]. Pt was independent without AD/AE usage and driving and working in her yard. Functional Status:  Mobility: Bed Mobility Overal bed mobility: Needs Assistance Bed Mobility: Supine to Sit Supine to sit: Mod assist General bed mobility comments: Bed flat with rails down, cuing pt to manage legs off R EOB and reach L UE across body to push up onto R elbow to ascend trunk. ModA with pt pulling on PT's hand to ascend trunk and to complete legs off EOB. Transfers Overall transfer level: Needs assistance Equipment used: Rolling walker (2 wheeled) Transfers: Sit to/from Stand Sit to Stand: Min assist,+2 safety/equipment General transfer comment: MinA for cuing and physically to lift to stand from EOB > RW. Cues provided for hand placement. Ambulation/Gait Ambulation/Gait assistance: Min assist,+2 safety/equipment Gait Distance (Feet): 15 Feet Assistive device: Rolling walker (2 wheeled) Gait Pattern/deviations: Step-to pattern,Decreased step length - left,Decreased stride length,Decreased dorsiflexion - left,Leaning posteriorly,Trunk flexed General Gait Details: Pt initially leaning posteriorly, needing cues to place weight into toes instead, mod success. Provided verbal, visual, and tactile cues to increase L step length, with good carryover. Pt continually looking down at feet. Mild instability but no appreciative L knee buckling. MinA for stability and +2 for safety and line management. Gait velocity: reduced Gait velocity interpretation: <1.31 ft/sec, indicative of household ambulator    ADL:    Cognition: Cognition Overall Cognitive Status: Impaired/Different from baseline Orientation Level: Oriented X4 Cognition Arousal/Alertness: Awake/alert Behavior During Therapy: WFL for tasks assessed/performed Overall Cognitive Status: Impaired/Different from baseline Area of  Impairment: Attention,Memory,Following commands,Safety/judgement,Awareness,Problem solving Current Attention Level: Sustained Memory: Decreased short-term memory Following Commands: Follows one step commands consistently,Follows one step commands with increased time Safety/Judgement: Decreased awareness of safety,Decreased awareness of deficits Awareness: Emergent Problem Solving: Slow processing,Difficulty sequencing,Requires verbal cues,Requires tactile cues General Comments: Pt with very slow processing and response to cues, needing up to ~10 seconds at times. Pt needing repeated multi-modal cues for sequencing steps with mobility. Poor awareness of her hands and feet placement, needing cues to correct.  Blood pressure 116/67, pulse 83, temperature 98.2 F (36.8 C), temperature source Oral, resp. rate 18, height 5\' 6"  (1.676 m), weight 59 kg, SpO2 97 %. Physical Exam Vitals reviewed.  Constitutional:      General: She is not in acute distress.    Appearance: She is normal weight.  HENT:     Head:     Comments: Head with dressing    Right Ear: External ear normal.     Left Ear: External ear normal.     Nose: Nose normal.  Eyes:     General:        Right eye: No discharge.        Left eye: No discharge.     Extraocular Movements: Extraocular movements intact.  Cardiovascular:     Rate and Rhythm: Normal rate and regular rhythm.  Pulmonary:     Effort: Pulmonary effort is normal. No respiratory distress.     Breath sounds: No stridor.  Abdominal:     General: Abdomen is flat. There is distension.     Comments: Hypoactive bowel sounds  Musculoskeletal:  Cervical back: Normal range of motion and neck supple.     Comments: No edema or tenderness in extremities  Skin:    Comments: Head with dressing CDI  Neurological:     Mental Status: She is alert.     Comments: Alert and oriented x3 Sensation intact to light touch Motor: UEs: 5/5 proximal distal RLE: Hip flexion, knee  extension 4/5, dorsiflexion 4+/5 LUE: 4/5 proximal distal LLE: Hip flexion, knee extension 4 -/5, ankle dorsiflexion 4/5  Psychiatric:        Mood and Affect: Affect is blunt.        Speech: Speech is delayed.        Behavior: Behavior is slowed.     Results for orders placed or performed during the hospital encounter of 09/05/20 (from the past 24 hour(s))  MRSA PCR Screening     Status: None   Collection Time: 09/05/20  5:50 PM   Specimen: Nasal Mucosa; Nasopharyngeal  Result Value Ref Range   MRSA by PCR NEGATIVE NEGATIVE   MR BRAIN W WO CONTRAST  Result Date: 09/06/2020 CLINICAL DATA:  Intracranial mass post resection EXAM: MRI HEAD WITHOUT AND WITH CONTRAST TECHNIQUE: Multiplanar, multiecho pulse sequences of the brain and surrounding structures were obtained without and with intravenous contrast. CONTRAST:  5.58mL GADAVIST GADOBUTROL 1 MMOL/ML IV SOLN COMPARISON:  Preoperative MRI 08/30/2020 FINDINGS: Brain: New postoperative changes are present including extra-axial fluid and air underlying the right frontal craniotomy. There is a right parasagittal frontal resection cavity containing fluid, blood products, and minimal air at the location of the mass on the prior study. There is no residual nodular enhancement. Minimal reduced diffusion at the surgical cavity margins. Similar adjacent T2 FLAIR hyperintensity. Mass effect is mild. No evidence of acute infarction or hemorrhage remote from the operative site. Vascular: Major vessel flow voids at the skull base are preserved. Skull and upper cervical spine: Normal marrow signal is preserved. Sinuses/Orbits: Paranasal sinuses are aerated. Orbits are unremarkable. Other: Sella is unremarkable.  Mastoid air cells are clear. IMPRESSION: Expected postoperative changes status post gross total resection of parasagittal right frontal mass. Electronically Signed   By: Macy Mis M.D.   On: 09/06/2020 07:25    Assessment/Plan: Diagnosis: Enhancing  mass in her right superior frontal gyrus/SMA region s/p resection. Labs independently reviewed.  Records reviewed and summated above.  1. Does the need for close, 24 hr/day medical supervision in concert with the patient's rehab needs make it unreasonable for this patient to be served in a less intensive setting? Yes  2. Co-Morbidities requiring supervision/potential complications: hyperlipidemia, HTN (monitor and provide prns in accordance with increased physical exertion and pain), tremors, post op pain (Biofeedback training with therapies to help reduce reliance on opiate pain medications, monitor pain control during therapies, and sedation at rest and titrate to maximum efficacy to ensure participation and gains in therapies) 3. Due to bladder management, bowel management, safety, skin/wound care, disease management, medication administration, pain management and patient education, does the patient require 24 hr/day rehab nursing? Yes 4. Does the patient require coordinated care of a physician, rehab nurse, therapy disciplines of PT/OT/SLP to address physical and functional deficits in the context of the above medical diagnosis(es)? Yes Addressing deficits in the following areas: balance, endurance, locomotion, strength, transferring, bathing, dressing, toileting, cognition and psychosocial support 5. Can the patient actively participate in an intensive therapy program of at least 3 hrs of therapy per day at least 5 days per week? Yes 6. The  potential for patient to make measurable gains while on inpatient rehab is excellent 7. Anticipated functional outcomes upon discharge from inpatient rehab are supervision and min assist  with PT, supervision and min assist with OT, modified independent with SLP. 8. Estimated rehab length of stay to reach the above functional goals is: 10 to 12 days. 9. Anticipated discharge destination: Home 10. Overall Rehab/Functional Prognosis:  excellent  RECOMMENDATIONS: This patient's condition is appropriate for continued rehabilitative care in the following setting: CIR when medically stable Patient has agreed to participate in recommended program. Yes Note that insurance prior authorization may be required for reimbursement for recommended care.  Comment: Rehab Admissions Coordinator to follow up.  I have personally performed a face to face diagnostic evaluation, including, but not limited to relevant history and physical exam findings, of this patient and developed relevant assessment and plan.  Additionally, I have reviewed and concur with the physician assistant's documentation above.   Delice Lesch, MD, ABPMR Lavon Paganini Angiulli, PA-C 09/06/2020

## 2020-09-06 NOTE — Progress Notes (Signed)
Subjective: Patient reports no headache or neurologic changes  Objective: Vital signs in last 24 hours: Temp:  [97.9 F (36.6 C)-98.6 F (37 C)] 98.2 F (36.8 C) (04/08 0800) Pulse Rate:  [65-96] 80 (04/08 0900) Resp:  [13-22] 18 (04/08 0900) BP: (98-122)/(51-94) 108/60 (04/08 0900) SpO2:  [94 %-100 %] 97 % (04/08 0900) Arterial Line BP: (112-147)/(46-65) 122/51 (04/08 0900) Weight:  [59 kg] 59 kg (04/07 1025)  Intake/Output from previous day: 04/07 0701 - 04/08 0700 In: 1423 [I.V.:1200; IV Piggyback:223] Out: 1225 [Urine:1150; Blood:75] Intake/Output this shift: Total I/O In: 220 [P.O.:120; IV Piggyback:100] Out: 650 [Urine:650]  Awake, alert, Ox3 FC x 4 L pronator drift  Lab Results: Recent Labs    09/05/20 1009  WBC 8.8  HGB 13.5  HCT 39.3  PLT 312   BMET Recent Labs    09/05/20 1009  NA 139  K 3.9  CL 104  CO2 28  GLUCOSE 96  BUN 10  CREATININE 0.73  CALCIUM 9.4    Studies/Results: MR BRAIN W WO CONTRAST  Result Date: 09/06/2020 CLINICAL DATA:  Intracranial mass post resection EXAM: MRI HEAD WITHOUT AND WITH CONTRAST TECHNIQUE: Multiplanar, multiecho pulse sequences of the brain and surrounding structures were obtained without and with intravenous contrast. CONTRAST:  5.90mL GADAVIST GADOBUTROL 1 MMOL/ML IV SOLN COMPARISON:  Preoperative MRI 08/30/2020 FINDINGS: Brain: New postoperative changes are present including extra-axial fluid and air underlying the right frontal craniotomy. There is a right parasagittal frontal resection cavity containing fluid, blood products, and minimal air at the location of the mass on the prior study. There is no residual nodular enhancement. Minimal reduced diffusion at the surgical cavity margins. Similar adjacent T2 FLAIR hyperintensity. Mass effect is mild. No evidence of acute infarction or hemorrhage remote from the operative site. Vascular: Major vessel flow voids at the skull base are preserved. Skull and upper cervical  spine: Normal marrow signal is preserved. Sinuses/Orbits: Paranasal sinuses are aerated. Orbits are unremarkable. Other: Sella is unremarkable.  Mastoid air cells are clear. IMPRESSION: Expected postoperative changes status post gross total resection of parasagittal right frontal mass. Electronically Signed   By: Macy Mis M.D.   On: 09/06/2020 07:25    Assessment/Plan: 70 yo F s/p crani for resection of tumor, doing well - downgrade - PT/OT - Dex taper - Keppra indefinitely   Darlene Marsh 09/06/2020, 9:58 AM

## 2020-09-06 NOTE — Evaluation (Signed)
Physical Therapy Evaluation Patient Details Name: Darlene Marsh MRN: [id number] DOB: 1950-10-19 Today's Date: 09/06/2020   History of Present Illness  Pt is a 70 y.o. female who presented 4/7 for L sided motor seizures, weakness, and cognitive decline. 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 Pt returned 4/7 for resection of mass. S/p R craniotomy with resection of mass 4/7. PMH: hypothyroidism, HTN, and HLD.  Clinical Impression  Pt presents with condition above and deficits mentioned below, see PT Problem List. PTA, she was independent with all functional mobility and ADLs. Pt was driving and enjoyed working in her yard and dancing with her husband. Currently, pt is functioning at a significantly lower level compared to her PLOF, needing modA for bed mobility and minA for transfers and short bedroom gait distances with a RW. 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 Pt requires extended period of time to process and respond to cues. Pt is very motivated to improve and has a great support system. Recommending follow-up with intensive therapy in the CIR setting to maximize her independence and safety with all functional mobility. Will continue to follow acutely.    Follow Up Recommendations CIR;Supervision/Assistance - 24 hour    Equipment Recommendations  Rolling walker with 5" wheels;3in1 (PT)    Recommendations for Other Services Rehab consult     Precautions / Restrictions Precautions Precautions: Fall Restrictions Weight Bearing Restrictions: No      Mobility  Bed Mobility Overal bed mobility: Needs Assistance Bed Mobility: Supine to Sit     Supine to sit: Mod assist     General bed mobility  comments: Bed flat with rails down, cuing pt to manage legs off R EOB and reach L UE across body to push up onto R elbow to ascend trunk. ModA with pt pulling on PT's hand to ascend trunk and to complete legs off EOB.    Transfers Overall transfer level: Needs assistance Equipment used: Rolling walker (2 wheeled) Transfers: Sit to/from Stand Sit to Stand: Min assist;+2 safety/equipment         General transfer comment: MinA for cuing and physically to lift to stand from EOB > RW. Cues provided for hand placement.  Ambulation/Gait Ambulation/Gait assistance: Min assist;+2 safety/equipment Gait Distance (Feet): 15 Feet Assistive device: Rolling walker (2 wheeled) Gait Pattern/deviations: Step-to pattern;Decreased step length - left;Decreased stride length;Decreased dorsiflexion - left;Leaning posteriorly;Trunk flexed Gait velocity: reduced Gait velocity interpretation: <1.31 ft/sec, indicative of household ambulator General Gait Details: Pt initially leaning posteriorly, needing cues to place weight into toes instead, mod success. Provided verbal, visual, and tactile cues to increase L step length, with good carryover. Pt continually looking down at feet. Mild instability but no appreciative L knee buckling. MinA for stability and +2 for safety and line management.  Stairs            Wheelchair Mobility    Modified Rankin (Stroke Patients Only) Modified Rankin (Stroke Patients Only) Pre-Morbid Rankin Score: No symptoms Modified Rankin: Moderately severe disability     Balance Overall balance assessment: Needs assistance Sitting-balance support: Bilateral upper extremity supported;Feet supported Sitting balance-Leahy Scale: Poor Sitting balance - Comments: Pt initially needing modA to prevent posterior lean, encouraged anterior lean through cuing pt to  slide hands down lap, progressed to min guard. Postural control: Posterior lean Standing balance support: Bilateral upper  extremity supported;During functional activity Standing balance-Leahy Scale: Poor Standing balance comment: Reliant on bil UE support and minA.                             Pertinent Vitals/Pain Pain Assessment: No/denies pain    Home Living Family/patient expects to be discharged to:: Private residence Living Arrangements: Spouse/significant other Available Help at Discharge: Family;Available 24 hours/day Type of Home: House Home Access: Stairs to enter Entrance Stairs-Rails: Left Entrance Stairs-Number of Steps: 4 Home Layout: One level Home Equipment: Other (comment) (can install toilet and shower grab bars if needed)      Prior Function Level of Independence: Independent         Comments: Pt loves to dance with her husband and make [employer]. Pt was independent without AD/AE usage and driving and working in her yard.     Hand Dominance        Extremity/Trunk Assessment   Upper Extremity Assessment Upper Extremity Assessment: LUE deficits/detail (slowly moving entire body) LUE Deficits / Details: Decreased grip strength compared to R LUE Sensation:  (denies numbness/tingling to touch) LUE Coordination: decreased fine motor;decreased gross motor (dysdiadachokinesia noted)    Lower Extremity Assessment Lower Extremity Assessment: LLE deficits/detail;Generalized weakness LLE Deficits / Details: MMT scores of the following: hip flexion 3+, knee extension 4-, knee flexion 3+, ankle dorsiflexion 3- (R leg MMT scores of the following: hip flexion 4-, knee extension 4+, knee flexion 4-, ankle dorsiflexion 4) LLE Sensation:  (denies numbness/tingling) LLE Coordination: decreased fine motor;decreased gross motor (dysdiadochokinesia noted)    Cervical / Trunk Assessment Cervical / Trunk Assessment: Normal  Communication   Communication: No difficulties  Cognition Arousal/Alertness: Awake/alert Behavior During Therapy: WFL for tasks  assessed/performed Overall Cognitive Status: Impaired/Different from baseline Area of Impairment: Attention;Memory;Following commands;Safety/judgement;Awareness;Problem solving                   Current Attention Level: Sustained Memory: Decreased short-term memory Following Commands: Follows one step commands consistently;Follows one step commands with increased time Safety/Judgement: Decreased awareness of safety;Decreased awareness of deficits Awareness: Emergent Problem Solving: Slow processing;Difficulty sequencing;Requires verbal cues;Requires tactile cues General Comments: Pt with very slow processing and response to cues, needing up to ~10 seconds at times. Pt needing repeated multi-modal cues for sequencing steps with mobility. Poor awareness of her hands and feet placement, needing cues to correct.      General Comments General comments (skin integrity, edema, etc.): VSS    Exercises     Assessment/Plan    PT Assessment Patient needs continued PT services  PT Problem List Decreased strength;Decreased range of motion;Decreased activity tolerance;Decreased balance;Decreased mobility;Decreased coordination;Decreased cognition;Decreased knowledge of use of DME;Decreased safety awareness       PT Treatment Interventions DME instruction;Gait training;Stair training;Functional mobility training;Therapeutic activities;Therapeutic exercise;Balance training;Neuromuscular re-education;Cognitive remediation;Patient/family education    PT Goals (Current goals can be found in the Care Plan section)  Acute Rehab PT Goals Patient Stated Goal: to improve and return to PLOF PT Goal Formulation: With patient/family Time For Goal Achievement: 09/20/20 Potential to Achieve Goals: Good    Frequency Min 4X/week   Barriers to discharge        Co-evaluation               AM-PAC PT "6 Clicks" Mobility  Outcome Measure Help needed turning from your  back to your side while in  a flat bed without using bedrails?: A Lot Help needed moving from lying on your back to sitting on the side of a flat bed without using bedrails?: A Lot Help needed moving to and from a bed to a chair (including a wheelchair)?: A Little Help needed standing up from a chair using your arms (e.g., wheelchair or bedside chair)?: A Little Help needed to walk in hospital room?: A Little Help needed climbing 3-5 steps with a railing? : A Lot 6 Click Score: 15    End of Session Equipment Utilized During Treatment: Gait belt Activity Tolerance: Patient tolerated treatment well Patient left: in chair;with call bell/phone within reach;with chair alarm set;with family/visitor present Nurse Communication: Mobility status PT Visit Diagnosis: Unsteadiness on feet (R26.81);Other abnormalities of gait and mobility (R26.89);Muscle weakness (generalized) (M62.81);Difficulty in walking, not elsewhere classified (R26.2);Other symptoms and signs involving the nervous system (R29.898)    Time: 9409-8286 PT Time Calculation (min) (ACUTE ONLY): 44 min   Charges:   PT Evaluation $PT Eval Moderate Complexity: 1 Mod PT Treatments $Gait Training: 8-22 mins $Therapeutic Activity: 8-22 mins        Moishe Spice, PT, DPT Acute Rehabilitation Services  Pager: [phone] Office: [phone]   Orvan Falconer 09/06/2020, 1:14 PM

## 2020-09-06 NOTE — Progress Notes (Signed)
Pt rolled onto unit from 4N.in wheelchair Brought by RN

## 2020-09-06 NOTE — Progress Notes (Signed)
Rehab Admissions Coordinator Note:  Patient was screened by Cleatrice Burke for appropriateness for an Inpatient Acute Rehab Consult per therapy recs. .  At this time, we are recommending Inpatient Rehab consult. I will place order per protocol.  Cleatrice Burke RN MSN 09/06/2020, 2:45 PM  I can be reached at [phone].

## 2020-09-07 ENCOUNTER — Inpatient Hospital Stay (HOSPITAL_COMMUNITY)
Admission: RE | Admit: 2020-09-07 | Discharge: 2020-09-20 | DRG: 056 | Disposition: A | Discharge status: Home Health | Payer: Medicare Other | Source: Intra-hospital | Attending: Physical Medicine and Rehabilitation | Admitting: Physical Medicine and Rehabilitation

## 2020-09-07 DIAGNOSIS — K5900 Constipation, unspecified: Secondary | ICD-10-CM | POA: Diagnosis present

## 2020-09-07 DIAGNOSIS — E785 Hyperlipidemia, unspecified: Secondary | ICD-10-CM | POA: Diagnosis present

## 2020-09-07 DIAGNOSIS — Z79899 Other long term (current) drug therapy: Secondary | ICD-10-CM | POA: Diagnosis not present

## 2020-09-07 DIAGNOSIS — G4700 Insomnia, unspecified: Secondary | ICD-10-CM

## 2020-09-07 DIAGNOSIS — G8918 Other acute postprocedural pain: Secondary | ICD-10-CM | POA: Diagnosis present

## 2020-09-07 DIAGNOSIS — R001 Bradycardia, unspecified: Secondary | ICD-10-CM | POA: Diagnosis not present

## 2020-09-07 DIAGNOSIS — G40909 Epilepsy, unspecified, not intractable, without status epilepticus: Secondary | ICD-10-CM | POA: Diagnosis present

## 2020-09-07 DIAGNOSIS — D72828 Other elevated white blood cell count: Secondary | ICD-10-CM | POA: Diagnosis present

## 2020-09-07 DIAGNOSIS — R251 Tremor, unspecified: Secondary | ICD-10-CM | POA: Diagnosis present

## 2020-09-07 DIAGNOSIS — R4189 Other symptoms and signs involving cognitive functions and awareness: Secondary | ICD-10-CM | POA: Diagnosis present

## 2020-09-07 DIAGNOSIS — R739 Hyperglycemia, unspecified: Secondary | ICD-10-CM | POA: Diagnosis present

## 2020-09-07 DIAGNOSIS — G8194 Hemiplegia, unspecified affecting left nondominant side: Secondary | ICD-10-CM | POA: Diagnosis present

## 2020-09-07 DIAGNOSIS — I10 Essential (primary) hypertension: Secondary | ICD-10-CM | POA: Diagnosis present

## 2020-09-07 DIAGNOSIS — D496 Neoplasm of unspecified behavior of brain: Secondary | ICD-10-CM

## 2020-09-07 DIAGNOSIS — R270 Ataxia, unspecified: Secondary | ICD-10-CM | POA: Diagnosis present

## 2020-09-07 DIAGNOSIS — R471 Dysarthria and anarthria: Secondary | ICD-10-CM | POA: Diagnosis present

## 2020-09-07 DIAGNOSIS — G936 Cerebral edema: Secondary | ICD-10-CM | POA: Diagnosis present

## 2020-09-07 DIAGNOSIS — R32 Unspecified urinary incontinence: Secondary | ICD-10-CM | POA: Diagnosis present

## 2020-09-07 DIAGNOSIS — T380X5A Adverse effect of glucocorticoids and synthetic analogues, initial encounter: Secondary | ICD-10-CM | POA: Diagnosis present

## 2020-09-07 DIAGNOSIS — C711 Malignant neoplasm of frontal lobe: Secondary | ICD-10-CM | POA: Diagnosis present

## 2020-09-07 LAB — CBC WITH DIFFERENTIAL/PLATELET
Abs Immature Granulocytes: 0.09 10*3/uL — ABNORMAL HIGH (ref 0.00–0.07)
Basophils Absolute: 0 10*3/uL (ref 0.0–0.1)
Basophils Relative: 0 %
Eosinophils Absolute: 0 10*3/uL (ref 0.0–0.5)
Eosinophils Relative: 0 %
HCT: 37.6 % (ref 36.0–46.0)
Hemoglobin: 13.4 g/dL (ref 12.0–15.0)
Immature Granulocytes: 0 %
Lymphocytes Relative: 7 %
Lymphs Abs: 1.4 10*3/uL (ref 0.7–4.0)
MCH: 33.3 pg (ref 26.0–34.0)
MCHC: 35.6 g/dL (ref 30.0–36.0)
MCV: 93.3 fL (ref 80.0–100.0)
Monocytes Absolute: 1.6 10*3/uL — ABNORMAL HIGH (ref 0.1–1.0)
Monocytes Relative: 7 %
Neutro Abs: 18.2 10*3/uL — ABNORMAL HIGH (ref 1.7–7.7)
Neutrophils Relative %: 86 %
Platelets: 281 10*3/uL (ref 150–400)
RBC: 4.03 MIL/uL (ref 3.87–5.11)
RDW: 11.5 % (ref 11.5–15.5)
WBC: 21.3 10*3/uL — ABNORMAL HIGH (ref 4.0–10.5)
nRBC: 0 % (ref 0.0–0.2)

## 2020-09-07 LAB — COMPREHENSIVE METABOLIC PANEL
ALT: 14 U/L (ref 0–44)
AST: 15 U/L (ref 15–41)
Albumin: 2.9 g/dL — ABNORMAL LOW (ref 3.5–5.0)
Alkaline Phosphatase: 66 U/L (ref 38–126)
Anion gap: 8 (ref 5–15)
BUN: 13 mg/dL (ref 8–23)
CO2: 26 mmol/L (ref 22–32)
Calcium: 8.6 mg/dL — ABNORMAL LOW (ref 8.9–10.3)
Chloride: 103 mmol/L (ref 98–111)
Creatinine, Ser: 0.58 mg/dL (ref 0.44–1.00)
GFR, Estimated: >60 mL/min (ref >60)
Glucose, Bld: 145 mg/dL — ABNORMAL HIGH (ref 70–99)
Potassium: 3.8 mmol/L (ref 3.5–5.1)
Sodium: 137 mmol/L (ref 135–145)
Total Bilirubin: 1.1 mg/dL (ref 0.3–1.2)
Total Protein: 6.2 g/dL — ABNORMAL LOW (ref 6.5–8.1)

## 2020-09-07 MED ORDER — DEXAMETHASONE 4 MG PO TABS
4.0000 mg | ORAL_TABLET | Freq: Three times a day (TID) | ORAL | Status: AC
  Administered 2020-09-07 – 2020-09-09 (×5): 4 mg via ORAL
  Filled 2020-09-07 (×6): qty 1

## 2020-09-07 MED ORDER — DEXAMETHASONE 2 MG PO TABS
2.0000 mg | ORAL_TABLET | Freq: Three times a day (TID) | ORAL | Status: AC
  Administered 2020-09-09 – 2020-09-11 (×6): 2 mg via ORAL
  Filled 2020-09-07 (×7): qty 1

## 2020-09-07 MED ORDER — DEXAMETHASONE 0.5 MG PO TABS
1.0000 mg | ORAL_TABLET | Freq: Every day | ORAL | Status: AC
  Administered 2020-09-16 – 2020-09-17 (×2): 1 mg via ORAL
  Filled 2020-09-07 (×2): qty 2

## 2020-09-07 MED ORDER — TRAMADOL HCL 50 MG PO TABS: 50.0000 mg | ORAL_TABLET | Freq: Four times a day (QID) | ORAL | Status: DC | PRN

## 2020-09-07 MED ORDER — ONDANSETRON HCL 4 MG/2ML IJ SOLN: 4.0000 mg | Freq: Four times a day (QID) | INTRAMUSCULAR | Status: DC | PRN

## 2020-09-07 MED ORDER — DEXAMETHASONE 2 MG PO TABS
2.0000 mg | ORAL_TABLET | Freq: Two times a day (BID) | ORAL | Status: AC
  Administered 2020-09-11 – 2020-09-12 (×4): 2 mg via ORAL
  Filled 2020-09-07 (×5): qty 1

## 2020-09-07 MED ORDER — ONDANSETRON HCL 4 MG PO TABS: 4.0000 mg | ORAL_TABLET | Freq: Four times a day (QID) | ORAL | Status: DC | PRN

## 2020-09-07 MED ORDER — ACETAMINOPHEN 325 MG PO TABS
325.0000 mg | ORAL_TABLET | ORAL | Status: DC | PRN
  Administered 2020-09-09 – 2020-09-20 (×9): 650 mg via ORAL
  Filled 2020-09-07 (×8): qty 2

## 2020-09-07 MED ORDER — LEVETIRACETAM 500 MG PO TABS
500.0000 mg | ORAL_TABLET | Freq: Two times a day (BID) | ORAL | Status: DC
  Administered 2020-09-07 – 2020-09-20 (×26): 500 mg via ORAL
  Filled 2020-09-07 (×26): qty 1

## 2020-09-07 MED ORDER — DOCUSATE SODIUM 100 MG PO CAPS
100.0000 mg | ORAL_CAPSULE | Freq: Two times a day (BID) | ORAL | Status: DC
  Administered 2020-09-07 – 2020-09-19 (×22): 100 mg via ORAL
  Filled 2020-09-07 (×24): qty 1

## 2020-09-07 MED ORDER — DEXAMETHASONE 0.5 MG PO TABS
1.0000 mg | ORAL_TABLET | Freq: Two times a day (BID) | ORAL | Status: AC
  Administered 2020-09-13 – 2020-09-14 (×4): 1 mg via ORAL
  Filled 2020-09-07 (×4): qty 2

## 2020-09-07 MED ORDER — TRAZODONE HCL 50 MG PO TABS
25.0000 mg | ORAL_TABLET | Freq: Every evening | ORAL | Status: DC | PRN
Start: 2020-09-07 — End: 2020-09-09
  Administered 2020-09-08: 50 mg via ORAL
  Filled 2020-09-07: qty 1

## 2020-09-07 NOTE — Discharge Instructions (Signed)
Wash hair with baby shampoo Take medications as prescribed

## 2020-09-07 NOTE — Progress Notes (Signed)
PMR Admission Coordinator Pre-Admission Assessment  Patient: Darlene Marsh is an 70 y.o., female MRN: [id number] DOB: July 18, 1950 Height: 5\' 6"  (167.6 cm) Weight: 59 kg                                                                                                                                                  Insurance Information HMO:     PPO:      PCP:      IPA:      80/20: yes     OTHER:  PRIMARY: Medicare Part A and B      Policy#: [id number]      Subscriber: patient CM Name:       Phone#:      Fax#:  Pre-Cert#: verified online      Employer:  Benefits:  Phone #:      Name:  Eff. Date: 01/30/97 A and B     Deduct: $1566      Out of Pocket Max: n/a      Life Max: n/a CIR: 100%      SNF: 20 full days Outpatient: 80%     Co-Pay: 20% Home Health: 100%      Co-Pay:  DME: 80%     Co-Pay: 20% Providers: pt choice SECONDARY: none    Policy#:       Phone#:   Development worker, community:       Phone#:   The Therapist, art Information Summary" for patients in Inpatient Rehabilitation Facilities with attached "Privacy Act [place] Records" was provided and verbally reviewed with: Patient and Family  Emergency Contact Information         Contact Information    Name Relation Home Work Mobile   Darlene Marsh,Darlene Marsh Spouse [phone]     Darlene Marsh, Darlene Marsh Daughter   [phone]     Current Medical History  Patient Admitting Diagnosis: Brain Tumor History of Present Illness: Darlene Sheltonis a 70 y.o.right-handed femalewith history ofhyperlipidemia, hypertension, tremors. History taken from chart review, daughter, husband, and patient. Patient lives with spouse.Independentprior to admission. 1 level home 3steps to entrywith bilateral railings.She presented on 09/05/2020 with left-sided motor seizures and difficulty using her left arm and leg. 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  Patient underwent right frontal interhemispheric tumor resectioncraniotomy on 4/7/2022per Dr. Duffy Rhody. Decadronprotocol as indicated. She remainson Keppra for seizure disorder. Maintain on a regular diet. Therapy evaluations completed due to patient's left-sided hemiparesisand tremors recommendations of physical medicine rehab consult. Glasgow Coma Scale Score: 15  Past Medical History      Past Medical History:  Diagnosis Date  . Hyperlipidemia   . Hypertension   . Thyroid disease    underactive  . Tremors of nervous system     Family History  family history includes Cancer in her  mother, sister, and sister.  Prior Rehab/Hospitalizations:  Has the patient had prior rehab or hospitalizations prior to admission? No  Has the patient had major surgery during 100 days prior to admission? Yes  Current Medications   Current Facility-Administered Medications:  .  acetaminophen (TYLENOL) tablet 650 mg, 650 mg, Oral, Q4H PRN, 650 mg at 09/05/20 2155 **OR** acetaminophen (TYLENOL) suppository 650 mg, 650 mg, Rectal, Q4H PRN, Vallarie Mare, MD .  Chlorhexidine Gluconate Cloth 2 % PADS 6 each, 6 each, Topical, Daily, Vallarie Mare, MD, 6 each at 09/05/20 1800 .  dexamethasone (DECADRON) tablet 4 mg, 4 mg, Oral, Q6H, 4 mg at 09/07/20 0517 **FOLLOWED BY** dexamethasone (DECADRON) tablet 4 mg, 4 mg, Oral, Q8H **FOLLOWED BY** [START ON 09/09/2020] dexamethasone (DECADRON) tablet 2 mg, 2 mg, Oral, Q8H **FOLLOWED BY** [START ON 09/11/2020] dexamethasone (DECADRON) tablet 2 mg, 2 mg, Oral, Q12H **FOLLOWED BY** [START ON 09/13/2020] dexamethasone (DECADRON) tablet 1 mg, 1 mg, Oral, Q12H **FOLLOWED BY** [START ON 09/16/2020] dexamethasone (DECADRON) tablet 1 mg, 1 mg, Oral, Daily, Vallarie Mare, MD .  docusate sodium (COLACE) capsule 100 mg, 100 mg, Oral, BID, Vallarie Mare, MD, 100 mg at 09/06/20 2200 .  enalaprilat (VASOTEC) injection 1.25 mg, 1.25 mg,  Intravenous, Q6H PRN, Vallarie Mare, MD .  HYDROcodone-acetaminophen (NORCO/VICODIN) 5-325 MG per tablet 1 tablet, 1 tablet, Oral, Q4H PRN, Vallarie Mare, MD, 1 tablet at 09/07/20 0517 .  labetalol (NORMODYNE) injection 10 mg, 10 mg, Intravenous, Q10 min PRN, Vallarie Mare, MD .  levETIRAcetam (KEPPRA) tablet 500 mg, 500 mg, Oral, BID, Vallarie Mare, MD, 500 mg at 09/06/20 2200 .  lisinopril (ZESTRIL) tablet 10 mg, 10 mg, Oral, Daily, Vallarie Mare, MD, 10 mg at 09/06/20 0947 .  morphine 2 MG/ML injection 1-2 mg, 1-2 mg, Intravenous, Q2H PRN, Vallarie Mare, MD .  ondansetron [facility]) tablet 4 mg, 4 mg, Oral, Q4H PRN **OR** ondansetron (ZOFRAN) injection 4 mg, 4 mg, Intravenous, Q4H PRN, Vallarie Mare, MD .  pantoprazole (PROTONIX) EC tablet 40 mg, 40 mg, Oral, Daily, Vallarie Mare, MD, 40 mg at 09/06/20 0947 .  polyethylene glycol (MIRALAX / GLYCOLAX) packet 17 g, 17 g, Oral, Daily PRN, Vallarie Mare, MD .  promethazine (PHENERGAN) tablet 12.5-25 mg, 12.5-25 mg, Oral, Q4H PRN, Vallarie Mare, MD .  sodium phosphate (FLEET) 7-19 GM/118ML enema 1 enema, 1 enema, Rectal, Once PRN, Vallarie Mare, MD  Patients Current Diet:     Diet Order                  Diet regular Room service appropriate? Yes; Fluid consistency: Thin  Diet effective now                  Precautions / Restrictions Precautions Precautions: Fall Restrictions Weight Bearing Restrictions: No   Has the patient had 2 or more falls or a fall with injury in the past year?No  Prior Activity Level Limited Community (1-2x/wk): Pt. was active in the community PTA  Prior Functional Level Prior Function Level of Independence: Independent Comments: Pt loves to dance with her husband and make TikTok videos. Pt was independent without AD/AE usage and driving and working in her yard.  Self Care: Did the patient need help bathing, dressing, using the toilet or  eating?  Independent  Indoor Mobility: Did the patient need assistance with walking from room to room (with or without device)? Independent  Stairs: Did the  patient need assistance with internal or external stairs (with or without device)? Independent  Functional Cognition: Did the patient need help planning regular tasks such as shopping or remembering to take medications? Independent  Home Assistive Devices / [place] Devices/Equipment: Eyeglasses Home Equipment: Other (comment) (can install toilet and shower grab bars if needed)  Prior Device Use: Indicate devices/aids used by the patient prior to current illness, exacerbation or injury? None of the above  Current Functional Level Cognition  Overall Cognitive Status: Impaired/Different from baseline Current Attention Level: Sustained Orientation Level: Oriented X4 Following Commands: Follows one step commands consistently,Follows one step commands with increased time Safety/Judgement: Decreased awareness of safety,Decreased awareness of deficits General Comments: Pt with very slow processing and response to cues, needing up to ~10 seconds at times. Pt needing repeated multi-modal cues for sequencing steps with mobility. Poor awareness of her hands and feet placement, needing cues to correct.    Extremity Assessment (includes Sensation/Coordination)  Upper Extremity Assessment: LUE deficits/detail (slowly moving entire body) LUE Deficits / Details: Decreased grip strength compared to R LUE Sensation:  (denies numbness/tingling to touch) LUE Coordination: decreased fine motor,decreased gross motor (dysdiadachokinesia noted)  Lower Extremity Assessment: LLE deficits/detail,Generalized weakness LLE Deficits / Details: MMT scores of the following: hip flexion 3+, knee extension 4-, knee flexion 3+, ankle dorsiflexion 3- (R leg MMT scores of the following: hip flexion 4-, knee extension 4+, knee flexion 4-, ankle  dorsiflexion 4) LLE Sensation:  (denies numbness/tingling) LLE Coordination: decreased fine motor,decreased gross motor (dysdiadochokinesia noted)    ADLs       Mobility  Overal bed mobility: Needs Assistance Bed Mobility: Supine to Sit Supine to sit: Mod assist General bed mobility comments: Bed flat with rails down, cuing pt to manage legs off R EOB and reach L UE across body to push up onto R elbow to ascend trunk. ModA with pt pulling on PT's hand to ascend trunk and to complete legs off EOB.    Transfers  Overall transfer level: Needs assistance Equipment used: Rolling walker (2 wheeled) Transfers: Sit to/from Stand Sit to Stand: Min assist,+2 safety/equipment General transfer comment: MinA for cuing and physically to lift to stand from EOB > RW. Cues provided for hand placement.    Ambulation / Gait / Stairs / Wheelchair Mobility  Ambulation/Gait Ambulation/Gait assistance: Min assist,+2 safety/equipment Gait Distance (Feet): 15 Feet Assistive device: Rolling walker (2 wheeled) Gait Pattern/deviations: Step-to pattern,Decreased step length - left,Decreased stride length,Decreased dorsiflexion - left,Leaning posteriorly,Trunk flexed General Gait Details: Pt initially leaning posteriorly, needing cues to place weight into toes instead, mod success. Provided verbal, visual, and tactile cues to increase L step length, with good carryover. Pt continually looking down at feet. Mild instability but no appreciative L knee buckling. MinA for stability and +2 for safety and line management. Gait velocity: reduced Gait velocity interpretation: <1.31 ft/sec, indicative of household ambulator    Posture / Balance Dynamic Sitting Balance Sitting balance - Comments: Pt initially needing modA to prevent posterior lean, encouraged anterior lean through cuing pt to slide hands down lap, progressed to min guard. Balance Overall balance assessment: Needs assistance Sitting-balance  support: Bilateral upper extremity supported,Feet supported Sitting balance-Leahy Scale: Poor Sitting balance - Comments: Pt initially needing modA to prevent posterior lean, encouraged anterior lean through cuing pt to slide hands down lap, progressed to min guard. Postural control: Posterior lean Standing balance support: Bilateral upper extremity supported,During functional activity Standing balance-Leahy Scale: Poor Standing balance comment: Reliant on bil  UE support and minA.    Special needs/care consideration Skin Surgical incision; Ecchymosis on LLE     Previous Home Environment (from acute therapy documentation) Living Arrangements: Spouse/significant other  Lives With: Spouse Available Help at Discharge: Family,Available 24 hours/day Type of Home: House Home Layout: One level Home Access: Stairs to enter Entrance Stairs-Rails: Left Entrance Stairs-Number of Steps: 4 Bathroom Shower/Tub: Multimedia programmer: Handicapped height Bathroom Accessibility: Yes How Accessible: Accessible via walker [place]: No  Discharge Living Setting Plans for Discharge Living Setting: Patient's home Type of Home at Discharge: House Discharge Home Layout: One level Discharge Home Access: Stairs to enter Entrance Stairs-Rails: Left Entrance Stairs-Number of Steps: 4 Discharge Bathroom Shower/Tub: Walk-in shower Discharge Bathroom Toilet: Handicapped height Discharge Bathroom Accessibility: Yes How Accessible: Accessible via walker Does the patient have any problems obtaining your medications?: No  Social/Family/Support Systems Patient Roles: Spouse Contact Information: [phone] Anticipated Caregiver: Darlene Marsh Anticipated Caregiver's Contact Information: [phone] Ability/Limitations of Caregiver: Can provide min A Caregiver Availability: 24/7 Discharge Plan Discussed with Primary Caregiver: Yes Is Caregiver In Agreement with Plan?: Yes Does  Caregiver/Family have Issues with Lodging/Transportation while Pt is in Rehab?: No   Goals Patient/Family Goal for Rehab: PT/OT Supervision to Min A; SLP Mod I Expected length of stay: 10-12 days Pt/Family Agrees to Admission and willing to participate: Yes Program Orientation Provided & Reviewed with Pt/Caregiver Including Roles  & Responsibilities: Yes   Decrease burden of Care through IP rehab admission: Specialzed equipment needs, Decrease number of caregivers and Patient/family education   Possible need for SNF placement upon discharge: not anticipated    Patient Condition: This patient's condition remains as documented in the consult dated 09/06/2020, in which the Rehabilitation Physician determined and documented that the patient's condition is appropriate for intensive rehabilitative care in an inpatient rehabilitation facility. Will admit to inpatient rehab today.  Preadmission Screen Completed By:  Genella Mech, CCC-SLP, 09/07/2020 9:51 AM ______________________________________________________________________   Discussed status with Dr. Letta Pate on 09/07/2020 at 71 and received approval for admission today.  Admission Coordinator:  Genella Mech, time 956/Date 09/07/2020           Cosigned by: Charlett Blake, MD at 09/07/2020 9:58 AM

## 2020-09-07 NOTE — PMR Pre-admission (Signed)
PMR Admission Coordinator Pre-Admission Assessment  Patient: Darlene Marsh is an 70 y.o., female MRN: [id number] DOB: 04-Mar-1951 Height: 5\' 6"  (167.6 cm) Weight: 59 kg              Insurance Information HMO:     PPO:      PCP:      IPA:      80/20: yes     OTHER:  PRIMARY: Medicare Part A and B      Policy#: [id number]      Subscriber: patient CM Name:       Phone#:      Fax#:  Pre-Cert#: verified online      Employer:  Benefits:  Phone #:      Name:  Eff. Date: 01/30/97 A and B     Deduct: $1566      Out of Pocket Max: n/a      Life Max: n/a CIR: 100%      SNF: 20 full days Outpatient: 80%     Co-Pay: 20% Home Health: 100%      Co-Pay:  DME: 80%     Co-Pay: 20% Providers: pt choice SECONDARY: none    Policy#:       Phone#:   Development worker, community:       Phone#:   The Therapist, art Information Summary" for patients in Inpatient Rehabilitation Facilities with attached "Privacy Act [place] Records" was provided and verbally reviewed with: Patient and Family  Emergency Contact Information Contact Information    Name Relation Home Work Mobile   Henneke,Arthur Spouse [phone]     Martie, [place] Daughter   [phone]     Current Medical History  Patient Admitting Diagnosis: Brain Tumor History of Present Illness: Darlene Marsh is a 70 y.o. right-handed female with history of hyperlipidemia, hypertension, tremors.  History taken from chart review, daughter, husband, and patient.  Patient lives with spouse.  Independent prior to admission.  1 level home 3 steps to entry with bilateral railings.  She presented on 09/05/2020 with left-sided motor seizures and difficulty using her left arm and leg.  She was placed on Keppra. 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  Patient underwent right frontal interhemispheric tumor resection craniotomy on 09/05/2020 per Dr. Duffy Rhody.  Decadron protocol as indicated.  She remains  on Keppra for seizure disorder.  Maintain on a regular diet.  Therapy evaluations completed due to patient's left-sided hemiparesis and tremors recommendations of physical medicine rehab consult.   Glasgow Coma Scale Score: 15  Past Medical History  Past Medical History:  Diagnosis Date  . Hyperlipidemia   . Hypertension   . Thyroid disease    underactive  . Tremors of nervous system     Family History  family history includes Cancer in her mother, sister, and sister.  Prior Rehab/Hospitalizations:  Has the patient had prior rehab or hospitalizations prior to admission? No  Has the patient had major surgery during 100 days prior to admission? Yes  Current Medications   Current Facility-Administered Medications:  .  acetaminophen (TYLENOL) tablet 650 mg, 650 mg, Oral, Q4H PRN, 650 mg at 09/05/20 2155 **OR** acetaminophen (TYLENOL) suppository 650 mg, 650 mg, Rectal, Q4H PRN, Vallarie Mare, MD .  Chlorhexidine Gluconate Cloth 2 % PADS 6 each, 6 each, Topical, Daily, Vallarie Mare, MD, 6 each at 09/05/20 1800 .  dexamethasone (DECADRON) tablet 4 mg, 4 mg, Oral, Q6H, 4 mg at  09/07/20 0517 **FOLLOWED BY** dexamethasone (DECADRON) tablet 4 mg, 4 mg, Oral, Q8H **FOLLOWED BY** [START ON 09/09/2020] dexamethasone (DECADRON) tablet 2 mg, 2 mg, Oral, Q8H **FOLLOWED BY** [START ON 09/11/2020] dexamethasone (DECADRON) tablet 2 mg, 2 mg, Oral, Q12H **FOLLOWED BY** [START ON 09/13/2020] dexamethasone (DECADRON) tablet 1 mg, 1 mg, Oral, Q12H **FOLLOWED BY** [START ON 09/16/2020] dexamethasone (DECADRON) tablet 1 mg, 1 mg, Oral, Daily, Vallarie Mare, MD .  docusate sodium (COLACE) capsule 100 mg, 100 mg, Oral, BID, Vallarie Mare, MD, 100 mg at 09/06/20 2200 .  enalaprilat (VASOTEC) injection 1.25 mg, 1.25 mg, Intravenous, Q6H PRN, Vallarie Mare, MD .  HYDROcodone-acetaminophen (NORCO/VICODIN) 5-325 MG per tablet 1 tablet, 1 tablet, Oral, Q4H PRN, Vallarie Mare, MD, 1 tablet at  09/07/20 0517 .  labetalol (NORMODYNE) injection 10 mg, 10 mg, Intravenous, Q10 min PRN, Vallarie Mare, MD .  levETIRAcetam (KEPPRA) tablet 500 mg, 500 mg, Oral, BID, Vallarie Mare, MD, 500 mg at 09/06/20 2200 .  lisinopril (ZESTRIL) tablet 10 mg, 10 mg, Oral, Daily, Vallarie Mare, MD, 10 mg at 09/06/20 0947 .  morphine 2 MG/ML injection 1-2 mg, 1-2 mg, Intravenous, Q2H PRN, Vallarie Mare, MD .  ondansetron [facility]) tablet 4 mg, 4 mg, Oral, Q4H PRN **OR** ondansetron (ZOFRAN) injection 4 mg, 4 mg, Intravenous, Q4H PRN, Vallarie Mare, MD .  pantoprazole (PROTONIX) EC tablet 40 mg, 40 mg, Oral, Daily, Vallarie Mare, MD, 40 mg at 09/06/20 0947 .  polyethylene glycol (MIRALAX / GLYCOLAX) packet 17 g, 17 g, Oral, Daily PRN, Vallarie Mare, MD .  promethazine (PHENERGAN) tablet 12.5-25 mg, 12.5-25 mg, Oral, Q4H PRN, Vallarie Mare, MD .  sodium phosphate (FLEET) 7-19 GM/118ML enema 1 enema, 1 enema, Rectal, Once PRN, Vallarie Mare, MD  Patients Current Diet:  Diet Order            Diet regular Room service appropriate? Yes; Fluid consistency: Thin  Diet effective now                 Precautions / Restrictions Precautions Precautions: Fall Restrictions Weight Bearing Restrictions: No   Has the patient had 2 or more falls or a fall with injury in the past year?No  Prior Activity Level Limited Community (1-2x/wk): Pt. was active in the community PTA  Prior Functional Level Prior Function Level of Independence: Independent Comments: Pt loves to dance with her husband and make TikTok videos. Pt was independent without AD/AE usage and driving and working in her yard.  Self Care: Did the patient need help bathing, dressing, using the toilet or eating?  Independent  Indoor Mobility: Did the patient need assistance with walking from room to room (with or without device)? Independent  Stairs: Did the patient need assistance with internal or  external stairs (with or without device)? Independent  Functional Cognition: Did the patient need help planning regular tasks such as shopping or remembering to take medications? Independent  Home Assistive Devices / [place] Devices/Equipment: Eyeglasses Home Equipment: Other (comment) (can install toilet and shower grab bars if needed)  Prior Device Use: Indicate devices/aids used by the patient prior to current illness, exacerbation or injury? None of the above  Current Functional Level Cognition  Overall Cognitive Status: Impaired/Different from baseline Current Attention Level: Sustained Orientation Level: Oriented X4 Following Commands: Follows one step commands consistently,Follows one step commands with increased time Safety/Judgement: Decreased awareness of safety,Decreased awareness of deficits General Comments: Pt with  very slow processing and response to cues, needing up to ~10 seconds at times. Pt needing repeated multi-modal cues for sequencing steps with mobility. Poor awareness of her hands and feet placement, needing cues to correct.    Extremity Assessment (includes Sensation/Coordination)  Upper Extremity Assessment: LUE deficits/detail (slowly moving entire body) LUE Deficits / Details: Decreased grip strength compared to R LUE Sensation:  (denies numbness/tingling to touch) LUE Coordination: decreased fine motor,decreased gross motor (dysdiadachokinesia noted)  Lower Extremity Assessment: LLE deficits/detail,Generalized weakness LLE Deficits / Details: MMT scores of the following: hip flexion 3+, knee extension 4-, knee flexion 3+, ankle dorsiflexion 3- (R leg MMT scores of the following: hip flexion 4-, knee extension 4+, knee flexion 4-, ankle dorsiflexion 4) LLE Sensation:  (denies numbness/tingling) LLE Coordination: decreased fine motor,decreased gross motor (dysdiadochokinesia noted)    ADLs       Mobility  Overal bed mobility: Needs  Assistance Bed Mobility: Supine to Sit Supine to sit: Mod assist General bed mobility comments: Bed flat with rails down, cuing pt to manage legs off R EOB and reach L UE across body to push up onto R elbow to ascend trunk. ModA with pt pulling on PT's hand to ascend trunk and to complete legs off EOB.    Transfers  Overall transfer level: Needs assistance Equipment used: Rolling walker (2 wheeled) Transfers: Sit to/from Stand Sit to Stand: Min assist,+2 safety/equipment General transfer comment: MinA for cuing and physically to lift to stand from EOB > RW. Cues provided for hand placement.    Ambulation / Gait / Stairs / Wheelchair Mobility  Ambulation/Gait Ambulation/Gait assistance: Min assist,+2 safety/equipment Gait Distance (Feet): 15 Feet Assistive device: Rolling walker (2 wheeled) Gait Pattern/deviations: Step-to pattern,Decreased step length - left,Decreased stride length,Decreased dorsiflexion - left,Leaning posteriorly,Trunk flexed General Gait Details: Pt initially leaning posteriorly, needing cues to place weight into toes instead, mod success. Provided verbal, visual, and tactile cues to increase L step length, with good carryover. Pt continually looking down at feet. Mild instability but no appreciative L knee buckling. MinA for stability and +2 for safety and line management. Gait velocity: reduced Gait velocity interpretation: <1.31 ft/sec, indicative of household ambulator    Posture / Balance Dynamic Sitting Balance Sitting balance - Comments: Pt initially needing modA to prevent posterior lean, encouraged anterior lean through cuing pt to slide hands down lap, progressed to min guard. Balance Overall balance assessment: Needs assistance Sitting-balance support: Bilateral upper extremity supported,Feet supported Sitting balance-Leahy Scale: Poor Sitting balance - Comments: Pt initially needing modA to prevent posterior lean, encouraged anterior lean through cuing pt to  slide hands down lap, progressed to min guard. Postural control: Posterior lean Standing balance support: Bilateral upper extremity supported,During functional activity Standing balance-Leahy Scale: Poor Standing balance comment: Reliant on bil UE support and minA.    Special needs/care consideration Skin Surgical incision; Ecchymosis on LLE     Previous Home Environment (from acute therapy documentation) Living Arrangements: Spouse/significant other  Lives With: Spouse Available Help at Discharge: Family,Available 24 hours/day Type of Home: House Home Layout: One level Home Access: Stairs to enter Entrance Stairs-Rails: Left Entrance Stairs-Number of Steps: 4 Bathroom Shower/Tub: Multimedia programmer: Handicapped height Bathroom Accessibility: Yes How Accessible: Accessible via walker [place]: No  Discharge Living Setting Plans for Discharge Living Setting: Patient's home Type of Home at Discharge: House Discharge Home Layout: One level Discharge Home Access: Stairs to enter Entrance Stairs-Rails: Left Entrance Stairs-Number of Steps: 4 Discharge Bathroom Shower/Tub:  Walk-in shower Discharge Bathroom Toilet: Handicapped height Discharge Bathroom Accessibility: Yes How Accessible: Accessible via walker Does the patient have any problems obtaining your medications?: No  Social/Family/Support Systems Patient Roles: Spouse Contact Information: [phone] Anticipated Caregiver: Srinidhi Landers Anticipated Caregiver's Contact Information: [phone] Ability/Limitations of Caregiver: Can provide min A Caregiver Availability: 24/7 Discharge Plan Discussed with Primary Caregiver: Yes Is Caregiver In Agreement with Plan?: Yes Does Caregiver/Family have Issues with Lodging/Transportation while Pt is in Rehab?: No   Goals Patient/Family Goal for Rehab: PT/OT Supervision to Min A; SLP Mod I Expected length of stay: 10-12 days Pt/Family Agrees to  Admission and willing to participate: Yes Program Orientation Provided & Reviewed with Pt/Caregiver Including Roles  & Responsibilities: Yes   Decrease burden of Care through IP rehab admission: Specialzed equipment needs, Decrease number of caregivers and Patient/family education   Possible need for SNF placement upon discharge: not anticipated    Patient Condition: This patient's condition remains as documented in the consult dated 09/06/2020, in which the Rehabilitation Physician determined and documented that the patient's condition is appropriate for intensive rehabilitative care in an inpatient rehabilitation facility. Will admit to inpatient rehab today.  Preadmission Screen Completed By:  Genella Mech, CCC-SLP, 09/07/2020 9:51 AM ______________________________________________________________________   Discussed status with Dr. Letta Pate on 09/07/2020 at 52 and received approval for admission today.  Admission Coordinator:  Genella Mech, time 956/Date 09/07/2020

## 2020-09-07 NOTE — Progress Notes (Signed)
OT Cancellation Note  Patient Details Name: Darlene Marsh MRN: [id number] DOB: 1951-03-02   Cancelled Treatment:    Reason Eval/Treat Not Completed: Other (comment) (Order acknowledged today. Per CIR coordinator, pt does not need to be seen by OTR for approval to CIR at this time. OTR schedule does not permit time to see pt today.)  Defer OT eval for CIR therapists.  Jefferey Pica, OTR/L Acute Rehabilitation Services Pager: [phone] Office: [phone]   Cheray Pardi C 09/07/2020, 1:03 PM

## 2020-09-07 NOTE — Discharge Summary (Signed)
  Physician Discharge Summary  Patient ID: Darlene Marsh MRN: [id number] DOB/AGE: 05-Jul-1950 70 y.o.  Admit date: 09/05/2020 Discharge date: 09/07/2020  Admission Diagnoses:  Right frontal brain tumor  Discharge Diagnoses:  Same Active Problems:   Brain tumor ([place])   Dyslipidemia   Tremor   Postoperative pain   Discharged Condition: Stable  Hospital Course:  Darlene Marsh is a 70 y.o. female who developed focal motor seizures on her left side and was found to have a large enhancing partially cystic mass in her posterior right superior frontal gyrus.  For purposes of diagnosis as well as potential therapeutic and oncologic benefit, surgical resection was recommended to the patient and family.  She underwent craniotomy for resection on 09/05/2020.  Postoperatively, she was admitted to the ICU.  A MRI showed good resection of the mass with no untoward complications.  She was mobilized with physical therapy and downgraded to the floor.  She was deemed a good candidate for rehab.  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  Treatments: Surgery -right frontal craniotomy with interhemispheric approach for resection of tumor Discharge Exam: Blood pressure 121/64, pulse 79, temperature 98 F (36.7 C), temperature source Oral, resp. rate 18, height 5\' 6"  (1.676 m), weight 59 kg, SpO2 98 %. Awake, alert, oriented Speech slow but fluent and appropriate.  Oriented x3.   CN grossly intact Mild left pronator drift.  Left dysdiadochokinesia and slow rapid alternating movements. Wound c/d/i  Disposition: Discharge disposition: 02-Transferred to [place]    Vallarie Mare, MD. Schedule an appointment as soon as possible for a visit in 10 day(s).   Specialty: Neurosurgery Contact information: [address] [place] [address] [phone]                Signed: Vallarie Mare 09/07/2020, 10:25 AM

## 2020-09-07 NOTE — Progress Notes (Signed)
Inpatient Rehab Admissions Coordinator:   Met with patient at bedside to discuss potential CIR admission. Pt. Stated interest. I have a bed for her today, so will plan to admit her today. RN may call report to [phone].  Clemens Catholic, [facility], [place] Admissions Coordinator  [phone] ([place]) [phone] (office)

## 2020-09-07 NOTE — Progress Notes (Signed)
Expand All Collapse All     Show:Clear all [x] Manual[x] Template[] Copied  Added by: [x] Angiulli, Lavon Paganini, PA-C[x] Jamse Arn, MD   [] Hover for details       Physical Medicine and Rehabilitation Consult Reason for Consult.  Left side weakness with cognitive decline Referring Physician: Dr. Duffy Rhody  HPI: Darlene Marsh is a 70 y.o. right-handed female with history of hyperlipidemia, hypertension, tremors.  History taken from chart review, daughter, husband, and patient.  Patient lives with spouse.  Independent prior to admission.  1 level home 3 steps to entry with bilateral railings.  She presented on 09/05/2020 with left-sided motor seizures and difficulty using her left arm and leg.  She was placed on Keppra. 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  Patient underwent right frontal interhemispheric tumor resection craniotomy on 09/05/2020 per Dr. Duffy Rhody.  Decadron protocol as indicated.  She remains on Keppra for seizure disorder.  Maintain on a regular diet.  Therapy evaluations completed due to patient's left-sided hemiparesis and tremors recommendations of physical medicine rehab consult.  Review of Systems  Constitutional: Negative for chills and fever.  HENT: Negative for hearing loss.   Eyes: Negative for blurred vision and double vision.  Respiratory: Negative for cough and shortness of breath.   Cardiovascular: Negative for chest pain, palpitations and leg swelling.  Gastrointestinal: Positive for constipation. Negative for heartburn, nausea and vomiting.  Genitourinary: Negative for dysuria, flank pain and hematuria.  Musculoskeletal: Negative for joint pain and myalgias.  Skin: Negative for rash.  Neurological: Positive for dizziness, tremors, focal weakness, weakness and headaches.  Psychiatric/Behavioral: The patient has insomnia.   All other systems reviewed and are negative.       Past Medical History:  Diagnosis Date  . Hyperlipidemia   . Hypertension   . Thyroid disease    underactive  . Tremors of nervous system         Past Surgical History:  Procedure Laterality Date  . APPLICATION OF CRANIAL NAVIGATION Right 09/05/2020   Procedure: APPLICATION OF CRANIAL NAVIGATION;  Surgeon: Vallarie Mare, MD;  Location: [facility];  Service: Neurosurgery;  Laterality: Right;  . CRANIOTOMY Right 09/05/2020   Procedure: Craniotomy - right Frontal interhemispheric approach for tumor resection with BRAIN LAB;  Surgeon: Vallarie Mare, MD;  Location: [facility];  Service: Neurosurgery;  Laterality: Right;        Family History  Problem Relation Age of Onset  . Cancer Mother        Breast cancer  . Cancer Sister        Breast  . Cancer Sister        Breast   Social History:  reports that she has never smoked. She has never used smokeless tobacco. She reports that she does not drink alcohol and does not use drugs. Allergies: No Known Allergies       Medications Prior to Admission  Medication Sig Dispense Refill  . ibuprofen (ADVIL) 200 MG tablet Take 200 mg by mouth every 6 (six) hours as needed for moderate pain or mild pain.    Marland Kitchen levETIRAcetam (KEPPRA) 500 MG tablet Take 500 mg by mouth 2 (two) times daily.    Marland Kitchen lisinopril (ZESTRIL) 10 MG tablet TAKE 1 TABLET(10 MG) BY MOUTH DAILY (Patient taking differently: Take 10 mg by mouth daily.) 30 tablet 3  . methylPREDNISolone (MEDROL DOSEPAK) 4 MG TBPK tablet Take 4 mg by mouth 2 (  two) times daily. follow package directions    . COVID-19 Ad26 vaccine, JANSSEN/J&J, 0.5 ML injection INJECT AS DIRECTED .5 mL 0    Home: Home Living Family/patient expects to be discharged to:: Private residence Living Arrangements: Spouse/significant other Available Help at Discharge: Family,Available 24 hours/day Type of Home: House Home Access: Stairs to enter [employer] of Steps: 4 Entrance  Stairs-Rails: Left Home Layout: One level Bathroom Shower/Tub: Multimedia programmer: Handicapped height Home Equipment: Other (comment) (can install toilet and shower grab bars if needed)  Functional History: Prior Function Level of Independence: Independent Comments: Pt loves to dance with her husband and make [employer]. Pt was independent without AD/AE usage and driving and working in her yard. Functional Status:  Mobility: Bed Mobility Overal bed mobility: Needs Assistance Bed Mobility: Supine to Sit Supine to sit: Mod assist General bed mobility comments: Bed flat with rails down, cuing pt to manage legs off R EOB and reach L UE across body to push up onto R elbow to ascend trunk. ModA with pt pulling on PT's hand to ascend trunk and to complete legs off EOB. Transfers Overall transfer level: Needs assistance Equipment used: Rolling walker (2 wheeled) Transfers: Sit to/from Stand Sit to Stand: Min assist,+2 safety/equipment General transfer comment: MinA for cuing and physically to lift to stand from EOB > RW. Cues provided for hand placement. Ambulation/Gait Ambulation/Gait assistance: Min assist,+2 safety/equipment Gait Distance (Feet): 15 Feet Assistive device: Rolling walker (2 wheeled) Gait Pattern/deviations: Step-to pattern,Decreased step length - left,Decreased stride length,Decreased dorsiflexion - left,Leaning posteriorly,Trunk flexed General Gait Details: Pt initially leaning posteriorly, needing cues to place weight into toes instead, mod success. Provided verbal, visual, and tactile cues to increase L step length, with good carryover. Pt continually looking down at feet. Mild instability but no appreciative L knee buckling. MinA for stability and +2 for safety and line management. Gait velocity: reduced Gait velocity interpretation: <1.31 ft/sec, indicative of household ambulator  ADL:  Cognition: Cognition Overall Cognitive Status:  Impaired/Different from baseline Orientation Level: Oriented X4 Cognition Arousal/Alertness: Awake/alert Behavior During Therapy: WFL for tasks assessed/performed Overall Cognitive Status: Impaired/Different from baseline Area of Impairment: Attention,Memory,Following commands,Safety/judgement,Awareness,Problem solving Current Attention Level: Sustained Memory: Decreased short-term memory Following Commands: Follows one step commands consistently,Follows one step commands with increased time Safety/Judgement: Decreased awareness of safety,Decreased awareness of deficits Awareness: Emergent Problem Solving: Slow processing,Difficulty sequencing,Requires verbal cues,Requires tactile cues General Comments: Pt with very slow processing and response to cues, needing up to ~10 seconds at times. Pt needing repeated multi-modal cues for sequencing steps with mobility. Poor awareness of her hands and feet placement, needing cues to correct.  Blood pressure 116/67, pulse 83, temperature 98.2 F (36.8 C), temperature source Oral, resp. rate 18, height 5\' 6"  (1.676 m), weight 59 kg, SpO2 97 %. Physical Exam Vitals reviewed.  Constitutional:      General: She is not in acute distress.    Appearance: She is normal weight.  HENT:     Head:     Comments: Head with dressing    Right Ear: External ear normal.     Left Ear: External ear normal.     Nose: Nose normal.  Eyes:     General:        Right eye: No discharge.        Left eye: No discharge.     Extraocular Movements: Extraocular movements intact.  Cardiovascular:     Rate and Rhythm: Normal rate and regular rhythm.  Pulmonary:     Effort: Pulmonary effort is normal. No respiratory distress.     Breath sounds: No stridor.  Abdominal:     General: Abdomen is flat. There is distension.     Comments: Hypoactive bowel sounds  Musculoskeletal:     Cervical back: Normal range of motion and neck supple.     Comments: No edema or tenderness  in extremities  Skin:    Comments: Head with dressing CDI  Neurological:     Mental Status: She is alert.     Comments: Alert and oriented x3 Sensation intact to light touch Motor: UEs: 5/5 proximal distal RLE: Hip flexion, knee extension 4/5, dorsiflexion 4+/5 LUE: 4/5 proximal distal LLE: Hip flexion, knee extension 4 -/5, ankle dorsiflexion 4/5  Psychiatric:        Mood and Affect: Affect is blunt.        Speech: Speech is delayed.        Behavior: Behavior is slowed.     Lab Results Last 24 Hours       Results for orders placed or performed during the hospital encounter of 09/05/20 (from the past 24 hour(s))  MRSA PCR Screening     Status: None   Collection Time: 09/05/20  5:50 PM   Specimen: Nasal Mucosa; Nasopharyngeal  Result Value Ref Range   MRSA by PCR NEGATIVE NEGATIVE      Imaging Results (Last 48 hours)  MR BRAIN W WO CONTRAST  Result Date: 09/06/2020 CLINICAL DATA:  Intracranial mass post resection EXAM: MRI HEAD WITHOUT AND WITH CONTRAST TECHNIQUE: Multiplanar, multiecho pulse sequences of the brain and surrounding structures were obtained without and with intravenous contrast. CONTRAST:  5.64mL GADAVIST GADOBUTROL 1 MMOL/ML IV SOLN COMPARISON:  Preoperative MRI 08/30/2020 FINDINGS: Brain: New postoperative changes are present including extra-axial fluid and air underlying the right frontal craniotomy. There is a right parasagittal frontal resection cavity containing fluid, blood products, and minimal air at the location of the mass on the prior study. There is no residual nodular enhancement. Minimal reduced diffusion at the surgical cavity margins. Similar adjacent T2 FLAIR hyperintensity. Mass effect is mild. No evidence of acute infarction or hemorrhage remote from the operative site. Vascular: Major vessel flow voids at the skull base are preserved. Skull and upper cervical spine: Normal marrow signal is preserved. Sinuses/Orbits: Paranasal sinuses are  aerated. Orbits are unremarkable. Other: Sella is unremarkable.  Mastoid air cells are clear. IMPRESSION: Expected postoperative changes status post gross total resection of parasagittal right frontal mass. Electronically Signed   By: Macy Mis M.D.   On: 09/06/2020 07:25     Assessment/Plan: Diagnosis: Enhancing mass in her right superior frontal gyrus/SMA region s/p resection. Labs independently reviewed.  Records reviewed and summated above.  1. Does the need for close, 24 hr/day medical supervision in concert with the patient's rehab needs make it unreasonable for this patient to be served in a less intensive setting? Yes  2. Co-Morbidities requiring supervision/potential complications: hyperlipidemia, HTN (monitor and provide prns in accordance with increased physical exertion and pain), tremors, post op pain (Biofeedback training with therapies to help reduce reliance on opiate pain medications, monitor pain control during therapies, and sedation at rest and titrate to maximum efficacy to ensure participation and gains in therapies) 3. Due to bladder management, bowel management, safety, skin/wound care, disease management, medication administration, pain management and patient education, does the patient require 24 hr/day rehab nursing? Yes 4. Does the patient require coordinated care of a  physician, rehab nurse, therapy disciplines of PT/OT/SLP to address physical and functional deficits in the context of the above medical diagnosis(es)? Yes Addressing deficits in the following areas: balance, endurance, locomotion, strength, transferring, bathing, dressing, toileting, cognition and psychosocial support 5. Can the patient actively participate in an intensive therapy program of at least 3 hrs of therapy per day at least 5 days per week? Yes 6. The potential for patient to make measurable gains while on inpatient rehab is excellent 7. Anticipated functional outcomes upon discharge from  inpatient rehab are supervision and min assist  with PT, supervision and min assist with OT, modified independent with SLP. 8. Estimated rehab length of stay to reach the above functional goals is: 10 to 12 days. 9. Anticipated discharge destination: Home 10. Overall Rehab/Functional Prognosis: excellent  RECOMMENDATIONS: This patient's condition is appropriate for continued rehabilitative care in the following setting: CIR when medically stable Patient has agreed to participate in recommended program. Yes Note that insurance prior authorization may be required for reimbursement for recommended care.  Comment: Rehab Admissions Coordinator to follow up.  I have personally performed a face to face diagnostic evaluation, including, but not limited to relevant history and physical exam findings, of this patient and developed relevant assessment and plan.  Additionally, I have reviewed and concur with the physician assistant's documentation above.   Delice Lesch, MD, ABPMR Lavon Paganini Angiulli, PA-C 09/06/2020

## 2020-09-07 NOTE — H&P (Signed)
Physical Medicine and Rehabilitation Admission H&P    No chief complaint on file. : HPI: HPI: Darlene Marsh is a 70 y.o. right-handed female with history of hyperlipidemia, hypertension, tremors.  History taken from chart review, daughter, husband, and patient.  Patient lives with spouse.  Independent prior to admission.  1 level home 3 steps to entry with bilateral railings.  She presented on 09/05/2020 with left-sided motor seizures and difficulty using her left arm and leg.  She was placed on Keppra. 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  Patient underwent right frontal interhemispheric tumor resection craniotomy on 09/05/2020 per Dr. Duffy Rhody.  Decadron protocol as indicated.  She remains on Keppra for seizure disorder.  Maintain on a regular diet.  Therapy evaluations completed due to patient's left-sided hemiparesis and tremors recommendations of physical medicine rehab consult.   Review of Systems  Constitutional: Negative for chills and fever.  HENT: Negative for congestion and sore throat.   Eyes: Negative for blurred vision, pain and discharge.  Respiratory: Negative for cough, sputum production, shortness of breath, wheezing and stridor.   Cardiovascular: Negative for chest pain and leg swelling.  Gastrointestinal: Positive for constipation. Negative for nausea and vomiting.  Genitourinary: Negative for dysuria and flank pain.  Musculoskeletal: Negative for back pain, joint pain and neck pain.  Skin: Negative for itching and rash.  Neurological: Positive for focal weakness and seizures. Negative for dizziness, sensory change and headaches.  Psychiatric/Behavioral: Negative for hallucinations. The patient is not nervous/anxious.    Past Medical History:  Diagnosis Date  . Hyperlipidemia   . Hypertension   . Thyroid disease    underactive  . Tremors of nervous system    Past Surgical History:  Procedure Laterality  Date  . APPLICATION OF CRANIAL NAVIGATION Right 09/05/2020   Procedure: APPLICATION OF CRANIAL NAVIGATION;  Surgeon: Vallarie Mare, MD;  Location: [facility];  Service: Neurosurgery;  Laterality: Right;  . CRANIOTOMY Right 09/05/2020   Procedure: Craniotomy - right Frontal interhemispheric approach for tumor resection with BRAIN LAB;  Surgeon: Vallarie Mare, MD;  Location: [facility];  Service: Neurosurgery;  Laterality: Right;   Family History  Problem Relation Age of Onset  . Cancer Mother        Breast cancer  . Cancer Sister        Breast  . Cancer Sister        Breast   Social History:  reports that she has never smoked. She has never used smokeless tobacco. She reports that she does not drink alcohol and does not use drugs. Allergies: No Known Allergies Medications Prior to Admission  Medication Sig Dispense Refill  . ibuprofen (ADVIL) 200 MG tablet Take 200 mg by mouth every 6 (six) hours as needed for moderate pain or mild pain.    Marland Kitchen levETIRAcetam (KEPPRA) 500 MG tablet Take 500 mg by mouth 2 (two) times daily.    Marland Kitchen lisinopril (ZESTRIL) 10 MG tablet TAKE 1 TABLET(10 MG) BY MOUTH DAILY (Patient taking differently: Take 10 mg by mouth daily.) 30 tablet 3  . methylPREDNISolone (MEDROL DOSEPAK) 4 MG TBPK tablet Take 4 mg by mouth 2 (two) times daily. follow package directions    . COVID-19 Ad26 vaccine, JANSSEN/J&J, 0.5 ML injection INJECT AS DIRECTED .5 mL 0    Drug Regimen Review  Drug regimen was reviewed and remains appropriate with no significant issues identified  Home:  Functional History:    Functional Status:  Mobility:          ADL:    Cognition:      Physical Exam: There were no vitals taken for this visit. Physical Exam Vitals and nursing note reviewed.  Constitutional:      Appearance: She is normal weight.  HENT:     Head: Normocephalic and atraumatic.     Mouth/Throat:     Mouth: Mucous membranes are moist.  Eyes:     Extraocular  Movements: Extraocular movements intact.     Conjunctiva/sclera: Conjunctivae normal.     Pupils: Pupils are equal, round, and reactive to light.  Cardiovascular:     Rate and Rhythm: Normal rate and regular rhythm.     Heart sounds: Normal heart sounds. No murmur heard.   Pulmonary:     Effort: Pulmonary effort is normal. No respiratory distress.     Breath sounds: Normal breath sounds. No stridor.  Abdominal:     General: Abdomen is flat. Bowel sounds are normal. There is no distension.     Palpations: Abdomen is soft.  Musculoskeletal:     Cervical back: No tenderness.  Skin:    General: Skin is warm and dry.  Neurological:     Mental Status: She is alert and oriented to person, place, and time.     Cranial Nerves: Dysarthria and facial asymmetry present.     Sensory: No sensory deficit.     Motor: Weakness present. No tremor, abnormal muscle tone or seizure activity.     Coordination: Coordination abnormal.     Comments: Motor 5//5 Right  Deltoid, Biceps, Triceps, Grip, Hip flexors, Knee extensors and ankle dorsiflexors  4/5 Left Deltoid, Biceps, Triceps, Grip, Hip flexors, Knee extensors and ankle dorsiflexors  Psychiatric:        Attention and Perception: Attention normal.        Mood and Affect: Affect is blunt.        Speech: Speech is delayed.        Behavior: Behavior normal. Behavior is cooperative.     Results for orders placed or performed during the hospital encounter of 09/05/20 (from the past 48 hour(s))  MRSA PCR Screening     Status: None   Collection Time: 09/05/20  5:50 PM   Specimen: Nasal Mucosa; Nasopharyngeal  Result Value Ref Range   MRSA by PCR NEGATIVE NEGATIVE    Comment:        The GeneXpert MRSA Assay (FDA approved for NASAL specimens only), is one component of a comprehensive MRSA colonization surveillance program. It is not intended to diagnose MRSA infection nor to guide or monitor treatment for MRSA infections. Performed at [facility], [place] [address], [place], [place] [address]    MR BRAIN W WO CONTRAST  Result Date: 09/06/2020 CLINICAL DATA:  Intracranial mass post resection EXAM: MRI HEAD WITHOUT AND WITH CONTRAST TECHNIQUE: Multiplanar, multiecho pulse sequences of the brain and surrounding structures were obtained without and with intravenous contrast. CONTRAST:  5.66mL GADAVIST GADOBUTROL 1 MMOL/ML IV SOLN COMPARISON:  Preoperative MRI 08/30/2020 FINDINGS: Brain: New postoperative changes are present including extra-axial fluid and air underlying the right frontal craniotomy. There is a right parasagittal frontal resection cavity containing fluid, blood products, and minimal air at the location of the mass on the prior study. There is no residual nodular enhancement. Minimal reduced diffusion at the surgical cavity margins. Similar adjacent T2 FLAIR hyperintensity. Mass effect is mild. No evidence  of acute infarction or hemorrhage remote from the operative site. Vascular: Major vessel flow voids at the skull base are preserved. Skull and upper cervical spine: Normal marrow signal is preserved. Sinuses/Orbits: Paranasal sinuses are aerated. Orbits are unremarkable. Other: Sella is unremarkable.  Mastoid air cells are clear. IMPRESSION: Expected postoperative changes status post gross total resection of parasagittal right frontal mass. Electronically Signed   By: Macy Mis M.D.   On: 09/06/2020 07:25       Medical Problem List and Plan: 1.  Decline in ADLs self care and cognition secondary to Right frontal lobe tumor  -patient may  Shower with shower cap  -ELOS/Goals: 10-12d 2.  Antithrombotics: -DVT/anticoagulation:  Mechanical: Sequential compression devices, below knee Bilateral lower extremities  -antiplatelet therapy: none 3. Pain Management: Tylenol prn 4. Mood: monitor , may need neuropsych eval  -antipsychotic agents: none 5. Neuropsych: This patient is not capable of making decisions on her own  behalf. 6. Skin/Wound Care: Monitor incision frontal crani, no dressing needed 7. Fluids/Electrolytes/Nutrition: SLP eval, monitor fluid and caloric intake, poor appetite thus far may need IVF 8.  Cerebral edema, decadron taper 9.  Hx HTN- Cont lisinopril   Charlett Blake, MD 09/07/2020

## 2020-09-07 NOTE — Plan of Care (Signed)
  Problem: Skin Integrity: Goal: Demonstration of wound healing without infection will improve Outcome: Progressing   Problem: Clinical Measurements: Goal: Ability to maintain intracranial pressure will improve Outcome: Progressing   Problem: Clinical Measurements: Goal: Neurologic status will improve Outcome: Progressing

## 2020-09-07 NOTE — Progress Notes (Signed)
Patient transferred to rehab, [facility] room 09 via wheelchair. Report given to receiving RN.

## 2020-09-07 NOTE — Progress Notes (Signed)
Pt arrived to unit via bed by staff. Pt alert and oriented x4, no s/sx of distress, breathing even and unlabored, no SOB. No concerns noted at this time. Pt oriented to unit and staff.

## 2020-09-08 ENCOUNTER — Encounter (HOSPITAL_COMMUNITY): Payer: Self-pay | Admitting: Physical Medicine & Rehabilitation

## 2020-09-08 ENCOUNTER — Other Ambulatory Visit: Payer: Self-pay

## 2020-09-08 DIAGNOSIS — D496 Neoplasm of unspecified behavior of brain: Secondary | ICD-10-CM | POA: Diagnosis not present

## 2020-09-08 LAB — GLUCOSE, CAPILLARY
Glucose-Capillary: 150 mg/dL — ABNORMAL HIGH (ref 70–99)
Glucose-Capillary: 107 mg/dL — ABNORMAL HIGH (ref 70–99)
Glucose-Capillary: 120 mg/dL — ABNORMAL HIGH (ref 70–99)
Glucose-Capillary: 121 mg/dL — ABNORMAL HIGH (ref 70–99)

## 2020-09-08 LAB — HEMOGLOBIN A1C
Hgb A1c MFr Bld: 5.2 % (ref 4.8–5.6)
Mean Plasma Glucose: 102.54 mg/dL

## 2020-09-08 MED ORDER — PANTOPRAZOLE SODIUM 20 MG PO TBEC
20.0000 mg | DELAYED_RELEASE_TABLET | Freq: Every day | ORAL | Status: DC
  Administered 2020-09-08 – 2020-09-17 (×10): 20 mg via ORAL
  Filled 2020-09-08 (×12): qty 1

## 2020-09-08 MED ORDER — INSULIN ASPART 100 UNIT/ML ~~LOC~~ SOLN
0.0000 [IU] | Freq: Three times a day (TID) | SUBCUTANEOUS | Status: DC
  Administered 2020-09-08 – 2020-09-14 (×5): 1 [IU] via SUBCUTANEOUS

## 2020-09-08 MED ORDER — POLYETHYLENE GLYCOL 3350 17 G PO PACK
17.0000 g | PACK | Freq: Every day | ORAL | Status: DC
  Administered 2020-09-08 – 2020-09-16 (×9): 17 g via ORAL
  Filled 2020-09-08 (×9): qty 1

## 2020-09-08 MED ORDER — LISINOPRIL 5 MG PO TABS
5.0000 mg | ORAL_TABLET | Freq: Every day | ORAL | Status: DC
  Administered 2020-09-09 – 2020-09-11 (×3): 5 mg via ORAL
  Filled 2020-09-08 (×4): qty 1

## 2020-09-08 NOTE — Progress Notes (Signed)
Inpatient Rehabilitation Medication Review by a Pharmacist  A complete drug regimen review was completed for this patient to identify any potential clinically significant medication issues.  Clinically significant medication issues were identified:  yes   Type of Medication Issue Identified Description of Issue Urgent (address now) Non-Urgent (address on AM team rounds) Plan Plan Accepted by Provider? (Yes / No / Pending AM Rounds)  Drug Interaction(s) (clinically significant)       Duplicate Therapy       Allergy       No Medication Administration End Date       Incorrect Dose       Additional Drug Therapy Needed  Patient received lisinopril 10 mg daily while inpatient, discharge med rec has not been completed yet, but Dr. Letta Pate' note mentions continuing lisinopril but is not yet ordered.  Patient also received pantoprazole while inpatient, given that discharge med rec not completed, unclear if this was intended to be resumed or not.  Non-urgent Secure chat sent to Dr. Letta Pate to consider resuming when appropriate   Other        For non-urgent medication issues to be resolved on team rounds tomorrow morning a [facility] Secure Chat Handoff was sent to: Dr. Letta Pate   Pharmacist comments: Recommend resuming lisinopril as appropriate  Time spent performing this drug regimen review (minutes):  [place], PharmD PGY1 Pharmacy Resident 09/08/2020 9:05 AM  Please check [email or web address] for unit-specific pharmacy phone numbers.

## 2020-09-08 NOTE — Evaluation (Signed)
Occupational Therapy Assessment and Plan  Patient Details  Name: Darlene Marsh MRN: [id number] Date of Birth: [birth year]  OT Diagnosis: cognitive deficits, hemiplegia affecting non-dominant side and muscle weakness (generalized) Rehab Potential: Rehab Potential (ACUTE ONLY): Good ELOS: 2 weeks   Today's Date: 09/08/2020 OT Individual Time: 0905-1000 OT Individual Time Calculation (min): 55 min     Hospital Problem: Principal Problem:   Brain tumor [facility])   Past Medical History:  Past Medical History:  Diagnosis Date  . Hyperlipidemia   . Hypertension   . Thyroid disease    underactive  . Tremors of nervous system    Past Surgical History:  Past Surgical History:  Procedure Laterality Date  . APPLICATION OF CRANIAL NAVIGATION Right 09/05/2020   Procedure: APPLICATION OF CRANIAL NAVIGATION;  Surgeon: Vallarie Mare, MD;  Location: [place];  Service: Neurosurgery;  Laterality: Right;  . CRANIOTOMY Right 09/05/2020   Procedure: Craniotomy - right Frontal interhemispheric approach for tumor resection with BRAIN LAB;  Surgeon: Vallarie Mare, MD;  Location: [place];  Service: Neurosurgery;  Laterality: Right;    Assessment & Plan Clinical Impression: Darlene Sheltonis a 70 y.o.right-handed femalewith history ofhyperlipidemia, hypertension, tremors. History taken from chart review, daughter, husband, and patient. Patient lives with spouse.Independentprior to admission. 1 level home 3steps to entrywith bilateral railings.She presented on 09/05/2020 with left-sided motor seizures and difficulty using her left arm and leg. 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 Patient underwent right frontal interhemispheric tumor resectioncraniotomy on 4/7/2022per Dr. Duffy Rhody. Decadronprotocol as indicated. She remainson Keppra for seizure disorder. Maintain on a regular diet. Therapy evaluations  completed due to patient's left-sided hemiparesisand tremors recommendations of physical medicine rehab consult.  Patient transferred to CIR on 09/07/2020 .    Patient currently requires mod with basic self-care skills secondary to muscle weakness, motor apraxia, ataxia, decreased coordination and decreased motor planning, decreased attention to left, decreased initiation, decreased attention, decreased awareness, decreased problem solving, decreased safety awareness, decreased memory and delayed processing and decreased sitting balance, decreased standing balance, decreased postural control, hemiplegia and decreased balance strategies.  Prior to hospitalization, patient could complete ADLs with independent .  Patient will benefit from skilled intervention to decrease level of assist with basic self-care skills prior to discharge home with care partner.  Anticipate patient will require 24 hour supervision and follow up home health.  OT - End of Session Activity Tolerance: Tolerates 10 - 20 min activity with multiple rests Endurance Deficit: Yes Endurance Deficit Description: generalized endurance OT Assessment Rehab Potential (ACUTE ONLY): Good OT Patient demonstrates impairments in the following area(s): Balance OT Basic ADL's Functional Problem(s): Grooming;Bathing;Dressing;Toileting OT Transfers Functional Problem(s): Toilet;Tub/Shower OT Additional Impairment(s): Fuctional Use of Upper Extremity OT Plan OT Intensity: Minimum of 1-2 x/day, 45 to 90 minutes OT Frequency: 5 out of 7 days OT Duration/Estimated Length of Stay: 2 weeks OT Treatment/Interventions: Balance/vestibular training;Patient/family education;Community reintegration;Neuromuscular re-education;Self Care/advanced ADL retraining;Therapeutic Exercise;UE/LE Coordination activities;Cognitive remediation/compensation;Discharge planning;DME/adaptive equipment instruction;Functional mobility training;Pain management;Therapeutic  Activities;UE/LE Strength taining/ROM;Visual/perceptual remediation/compensation;Wheelchair propulsion/positioning OT Self Feeding Anticipated Outcome(s): set up assist OT Basic Self-Care Anticipated Outcome(s): supervision OT Toileting Anticipated Outcome(s): supervision OT Bathroom Transfers Anticipated Outcome(s): supervision OT Recommendation Recommendations for Other Services: Neuropsych consult Patient destination: Home Follow Up Recommendations: Home health OT Equipment Recommended: To be determined   OT Evaluation Precautions/Restrictions  Precautions Precautions: Fall Precaution Comments: s/p tumor resection, L lean Restrictions Weight Bearing Restrictions: No General Chart Reviewed: Yes Family/Caregiver  Present: No    Home Living/Prior Functioning Home Living Family/patient expects to be discharged to:: Private residence Living Arrangements: Spouse/significant other Available Help at Discharge: Family,Available 24 hours/day Type of Home: House Home Access: Stairs to enter [employer] of Steps: 3 Entrance Stairs-Rails: Right (L in garage, both in front of house) Home Layout: One level Bathroom Shower/Tub: Multimedia programmer: Associate Professor Accessibility: Yes  Lives With: Spouse IADL History Homemaking Responsibilities: Yes Meal Prep Responsibility: Primary Laundry Responsibility: Primary Cleaning Responsibility: Primary Bill Paying/Finance Responsibility: Primary Shopping Responsibility: Secondary Current License: Yes Occupation: Retired Prior Function Level of Independence: Independent with basic ADLs,Independent with transfers,Independent with gait,Independent with homemaking with ambulation  Able to [place]?: Yes Driving: Yes (husband does mostly) Vocation: Retired Leisure: Hobbies-yes (Comment) Comments: Loves to dance, working in the yard. Both pt and her husband enjoy making Tiktoks Vision Baseline Vision/History:  Wears glasses Wears Glasses: Reading only Patient Visual Report: No change from baseline Vision Assessment?: Yes Ocular Range of Motion: Impaired-to be further tested in functional context Alignment/Gaze Preference: Gaze right Tracking/Visual Pursuits: Impaired - to be further tested in functional context;Requires cues, head turns, or add eye shifts to track Saccades: Impaired - to be further tested in functional context Perception  Perception: Impaired Inattention/Neglect: Does not attend to left side of body Praxis Praxis: Impaired Praxis Impairment Details: Motor planning;Initiation Cognition Overall Cognitive Status: Impaired/Different from baseline Arousal/Alertness: Awake/alert Orientation Level: Person;Place;Situation Person: Oriented Place: Oriented Situation: Oriented Year: 2022 Month: April Day of Week: Correct Memory: Impaired Memory Impairment: Decreased recall of new information Immediate Memory Recall: Sock;Blue;Bed Memory Recall Sock: Not able to recall Memory Recall Blue: With Cue Memory Recall Bed: Not able to recall Attention: Selective Selective Attention: Impaired Selective Attention Impairment: Verbal basic;Functional basic Awareness: Impaired Awareness Impairment: Emergent impairment (aware of deficits but unable to compensate appropriately without cueing) Problem Solving: Impaired Problem Solving Impairment: Verbal basic;Functional basic Executive Function: Self Correcting;Self Monitoring Self Monitoring: Impaired Self Monitoring Impairment: Verbal basic;Functional basic Self Correcting: Impaired Self Correcting Impairment: Verbal basic;Functional basic Safety/Judgment: Impaired Sensation Sensation Light Touch: Appears Intact Hot/Cold: Appears Intact Proprioception: Impaired by gross assessment Coordination Gross Motor Movements are Fluid and Coordinated: No Fine Motor Movements are Fluid and Coordinated: No Coordination and Movement  Description: Limited by apraxia and ataxia Finger Nose Finger Test: [facility] Motor  Motor Motor: Hemiplegia Motor - Skilled Clinical Observations: L hemi, limited mainly by inattention and poor proprioceptive awareness  Trunk/Postural Assessment  Cervical Assessment Cervical Assessment: Within Functional Limits Thoracic Assessment Thoracic Assessment: Exceptions to [facility] (kyphotic posture) Lumbar Assessment Lumbar Assessment: Exceptions to [facility] (posterior pelvic tilt) Postural Control Postural Control: Deficits on evaluation Righting Reactions: delayed  Balance Balance Balance Assessed: Yes Static Sitting Balance Static Sitting - Balance Support: Feet supported Static Sitting - Level of Assistance: 4: Min assist Static Sitting - Comment/# of Minutes: L lean worsening with time sitting unsupported Dynamic Sitting Balance Dynamic Sitting - Balance Support: Feet supported Dynamic Sitting - Level of Assistance: 4: Min assist Dynamic Sitting Balance - Compensations: Able to correct L lean to midline with cueing and min A Static Standing Balance Static Standing - Balance Support: During functional activity Static Standing - Level of Assistance: 4: Min assist Dynamic Standing Balance Dynamic Standing - Balance Support: During functional activity Dynamic Standing - Level of Assistance: 4: Min assist Extremity/Trunk Assessment RUE Assessment RUE Assessment: Within Functional Limits LUE Assessment LUE Assessment: Exceptions to [facility] General Strength Comments: 3-/5 MMT shoulder, 3+/5 MMT of the elbow  and hand. Overall functional from a strength perspective but limited by inattention  Care Tool Care Tool Self Care Eating   Eating Assist Level: Supervision/Verbal cueing    Oral Care    Oral Care Assist Level: Contact Guard/[place] assist    Bathing   Body parts bathed by patient: Right arm;Left arm;Chest;Abdomen;Front perineal area;Buttocks;Face;Left lower leg;Right lower leg;Left upper  leg;Right upper leg     Assist Level: Moderate Assistance - Patient 50 - 74%    Upper Body Dressing(including orthotics)   What is the patient wearing?: Pull over shirt   Assist Level: Moderate Assistance - Patient 50 - 74%    Lower Body Dressing (excluding footwear)   What is the patient wearing?: Pants;Incontinence brief Assist for lower body dressing: Moderate Assistance - Patient 50 - 74%    Putting on/Taking off footwear   What is the patient wearing?: Non-skid slipper socks Assist for footwear: Minimal Assistance - Patient > 75%       Care Tool Toileting Toileting activity   Assist for toileting: Moderate Assistance - Patient 50 - 74%     Care Tool Bed Mobility Roll left and right activity   Roll left and right assist level: Moderate Assistance - Patient 50 - 74%    Sit to lying activity   Sit to lying assist level: Moderate Assistance - Patient 50 - 74%    Lying to sitting edge of bed activity   Lying to sitting edge of bed assist level: Moderate Assistance - Patient 50 - 74%     Care Tool Transfers Sit to stand transfer   Sit to stand assist level: Moderate Assistance - Patient 50 - 74%    Chair/bed transfer   Chair/bed transfer assist level: Minimal Assistance - Patient > 75%     Toilet transfer   Assist Level: Moderate Assistance - Patient 50 - 74%     Care Tool Cognition Expression of Ideas and Wants Expression of Ideas and Wants: Some difficulty - exhibits some difficulty with expressing needs and ideas (e.g, some words or finishing thoughts) or speech is not clear   Understanding Verbal and Non-Verbal Content Understanding Verbal and Non-Verbal Content: Usually understands - understands most conversations, but misses some part/intent of message. Requires cues at times to understand   Memory/Recall Ability *first 3 days only Memory/Recall Ability *first 3 days only: Current season;That he or she is in a hospital/hospital unit    Refer to Care Plan for  [place] 1 OT Short Term Goal 1 (Week 1): Pt will don shirt with min A OT Short Term Goal 2 (Week 1): Pt will complete toilet transfer with CGA OT Short Term Goal 3 (Week 1): Pt will require no more than min cueing for attention to L arm during bathing  Recommendations for other services: Neuropsych   Skilled Therapeutic Intervention Evaluation completed as detailed above, with intervention for ADLs EOB detailed below. Ambriana is a very sweet and motivated 70 yo female who presents with L sided weakness and inattention. This inattention is most prominent with her L arm specifically, as she often is unaware of her L hand continuing to grasp objects . Her sensation is intact on the LUE. Reviewed pertinent edu and CIR expectations/OT POC. Pt was left sitting up in the recliner with chair pad alarm on, her husband present and supportive.   ADL ADL Eating: Set up;Supervision/safety Where Assessed-Eating: Bed level Grooming: Minimal assistance Where Assessed-Grooming: Edge of bed Upper Body  Bathing: Moderate assistance;Moderate cueing Where Assessed-Upper Body Bathing: Edge of bed Lower Body Bathing: Moderate assistance;Moderate cueing Where Assessed-Lower Body Bathing: Standing at sink;Sitting at sink Upper Body Dressing: Moderate cueing;Moderate assistance Where Assessed-Upper Body Dressing: Sitting at sink;Standing at sink Lower Body Dressing: Moderate cueing;Moderate assistance Where Assessed-Lower Body Dressing: Edge of bed Toileting: Moderate cueing;Moderate assistance Where Assessed-Toileting: Glass blower/designer: Moderate assistance;Moderate verbal cueing Toilet Transfer Method: Ambulating Tub/Shower Transfer: Unable to assess Mobility  Bed Mobility Bed Mobility: Supine to Sit;Sit to Supine Supine to Sit: Moderate Assistance - Patient 50-74% Sit to Supine: Moderate Assistance - Patient 50-74% Transfers Sit to Stand: Moderate Assistance - Patient  50-74% Stand to Sit: Moderate Assistance - Patient 50-74%   Discharge Criteria: Patient will be discharged from OT if patient refuses treatment 3 consecutive times without medical reason, if treatment goals not met, if there is a change in medical status, if patient makes no progress towards goals or if patient is discharged from hospital.  The above assessment, treatment plan, treatment alternatives and goals were discussed and mutually agreed upon: by patient and by family  Curtis Sites 09/08/2020, 12:25 PM

## 2020-09-08 NOTE — Evaluation (Signed)
Speech Language Pathology Assessment and Plan  Patient Details  Name: Darlene Marsh MRN: [id number] Date of Birth: [birth year]  SLP Diagnosis: Cognitive Impairments  Rehab Potential: Good ELOS: 14 days    Today's Date: 09/08/2020 SLP Individual Time: 0805-0900 SLP Individual Time Calculation (min): 55 min   Hospital Problem: Principal Problem:   Brain tumor [facility])  Past Medical History:  Past Medical History:  Diagnosis Date  . Hyperlipidemia   . Hypertension   . Thyroid disease    underactive  . Tremors of nervous system    Past Surgical History:  Past Surgical History:  Procedure Laterality Date  . APPLICATION OF CRANIAL NAVIGATION Right 09/05/2020   Procedure: APPLICATION OF CRANIAL NAVIGATION;  Surgeon: Vallarie Mare, MD;  Location: [place];  Service: Neurosurgery;  Laterality: Right;  . CRANIOTOMY Right 09/05/2020   Procedure: Craniotomy - right Frontal interhemispheric approach for tumor resection with BRAIN LAB;  Surgeon: Vallarie Mare, MD;  Location: [place];  Service: Neurosurgery;  Laterality: Right;    Assessment / Plan / Recommendation Clinical Impression   Darlene Sheltonis a 70 y.o.right-handed femalewith history ofhyperlipidemia, hypertension, tremors. History taken from chart review, daughter, husband, and patient. Patient lives with spouse.Independentprior to admission. 1 level home 3steps to entrywith bilateral railings.She presented on 09/05/2020 with left-sided motor seizures and difficulty using her left arm and leg. 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 Patient underwent right frontal interhemispheric tumor resectioncraniotomy on 4/7/2022per Dr. Duffy Rhody. Decadronprotocol as indicated. She remainson Keppra for seizure disorder. Maintain on a regular diet. Therapy evaluations completed due to patient's left-sided hemiparesisand tremors recommendations of  physical medicine rehab consult.  SLP evaluation was completed on 09/08/2020 with results as follows:  Pt presents with moderate cognitive deficits characterized by decreased selective attention to tasks, decreased recall of new information, and decreased functional problem solving.  Subjectively, pt complains of "slow" thinking and did require increased time for processing during tasks.  Currently, pt requires min-mod assist to complete mildly complex tasks due to the deficits mentioned above.    A bedside swallow evaluation was also completed on this date with no overt s/s of aspiration evident with large consecutive straw sips of thin liquids and no complaints of swallowing difficulty during meals.  Cranial nerve exam was unremarkable for areas of significant oral motor weakness and her swallow response was swift per palpation.  I recommend that pt remain on her currently prescribed diet with no additional ST follow up indicated for dysphagia at this time.    Pt would benefit from skilled ST while inpatient in order to maximize functional independence and reduce burden of care prior to discharge.  Anticipate that pt will need home health ST follow up at discharge in addition to 24/7 supervision.     Skilled Therapeutic Interventions          Cognitive-linguistic evaluation completed with results and recommendations reviewed with family.     SLP Assessment  Patient will need skilled [place] [facility] during CIR admission    Recommendations  Recommendations for Other Services: Neuropsych consult Patient destination: Home Follow up Recommendations: Home Health SLP;24 hour supervision/assistance Equipment Recommended: None recommended by SLP    SLP Frequency 3 to 5 out of 7 days   SLP Duration  SLP Intensity  SLP Treatment/Interventions 14 days  Minumum of 1-2 x/day, 30 to 90 minutes  Cognitive remediation/compensation;Cueing hierarchy;Environmental controls;Functional  tasks;Patient/family education;Internal/external aids  Pain Pain Assessment Pain Scale: 0-10 Pain Score: 0-No pain  Prior Functioning Cognitive/Linguistic Baseline: Within functional limits Type of Home: House  Lives With: Spouse Available Help at Discharge: Family;Available 24 hours/day Vocation: Retired  Programmer, systems Overall Cognitive Status: Impaired/Different from baseline Arousal/Alertness: Awake/alert Orientation Level: Oriented X4 Attention: Selective Selective Attention: Impaired Selective Attention Impairment: Functional basic Memory: Impaired Memory Impairment: Decreased recall of new information Immediate Memory Recall: Sock;Blue;Bed Memory Recall Sock: Not able to recall Memory Recall Blue: With Cue Memory Recall Bed: Not able to recall Awareness: Impaired Awareness Impairment: Emergent impairment Problem Solving: Impaired Problem Solving Impairment: Functional basic Executive Function: Self Monitoring;Self Correcting Self Monitoring: Impaired Self Monitoring Impairment: Functional basic Self Correcting: Impaired Self Correcting Impairment: Functional basic Safety/Judgment: Impaired  Comprehension Auditory Comprehension Overall Auditory Comprehension: Appears within functional limits for tasks assessed Expression Expression Primary Mode of Expression: Verbal Verbal Expression Overall Verbal Expression: Appears within functional limits for tasks assessed Written Expression Dominant Hand: Right Oral Motor Oral Motor/Sensory Function Overall Oral Motor/Sensory Function: Within functional limits Motor Speech Overall Motor Speech: Appears within functional limits for tasks assessed  Care Tool Care Tool Cognition Expression of Ideas and Wants Expression of Ideas and Wants: Some difficulty - exhibits some difficulty with expressing needs and ideas (e.g, some words or finishing thoughts) or speech is not clear   Understanding Verbal and  Non-Verbal Content Understanding Verbal and Non-Verbal Content: Usually understands - understands most conversations, but misses some part/intent of message. Requires cues at times to understand   Memory/Recall Ability *first 3 days only Memory/Recall Ability *first 3 days only: Current season;That he or she is in a hospital/hospital unit     PMSV Assessment  PMSV Trial    Bedside Swallowing Assessment General Previous Swallow Assessment: none on record Diet Prior to this Study: Regular;Thin liquids Temperature Spikes Noted: No Respiratory Status: Room air Behavior/Cognition: Alert;Cooperative;Pleasant mood Oral Cavity - Dentition: Adequate natural dentition Self-Feeding Abilities: Able to feed self Patient Positioning: Upright in bed Baseline Vocal Quality: Normal Volitional Cough: Strong Volitional Swallow: Able to elicit  Oral Care Assessment   Ice Chips   Thin Liquid Thin Liquid: Within functional limits Nectar Thick   Honey Thick   Puree   Solid   BSE Assessment Risk for Aspiration Impact on safety and function: Mild aspiration risk Other Related Risk Factors: Cognitive impairment;Deconditioning  Short Term Goals: Week 1: SLP Short Term Goal 1 (Week 1): Pt will recall daily information with min assist cues for use of memory compensatory strategies. SLP Short Term Goal 2 (Week 1): Pt will selectively attend to tasks for 5-7 minute intervals with min cues for redirection. SLP Short Term Goal 3 (Week 1): Pt will complete mildly complex tasks with min verbal cues for functional problem solving.  Refer to Care Plan for Long Term Goals  Recommendations for other services: None   Discharge Criteria: Patient will be discharged from SLP if patient refuses treatment 3 consecutive times without medical reason, if treatment goals not met, if there is a change in medical status, if patient makes no progress towards goals or if patient is discharged from hospital.  The  above assessment, treatment plan, treatment alternatives and goals were discussed and mutually agreed upon: by patient and by family  Emilio Math 09/08/2020, 12:48 PM

## 2020-09-08 NOTE — Evaluation (Signed)
Physical Therapy Assessment and Plan  Patient Details  Name: Darlene Marsh MRN: [id number] Date of Birth: 10/20/1950  PT Diagnosis: Abnormal posture, Abnormality of gait, Cognitive deficits, Difficulty walking, Hemiplegia non-dominant and Muscle weakness Rehab Potential: Good ELOS: 14-16 days   Today's Date: 09/08/2020 PT Individual Time: 1330-1430 PT Individual Time Calculation (min): 60 min    Hospital Problem: Principal Problem:   Brain tumor [facility])   Past Medical History:  Past Medical History:  Diagnosis Date  . Hyperlipidemia   . Hypertension   . Thyroid disease    underactive  . Tremors of nervous system    Past Surgical History:  Past Surgical History:  Procedure Laterality Date  . APPLICATION OF CRANIAL NAVIGATION Right 09/05/2020   Procedure: APPLICATION OF CRANIAL NAVIGATION;  Surgeon: Vallarie Mare, MD;  Location: [place];  Service: Neurosurgery;  Laterality: Right;  . CRANIOTOMY Right 09/05/2020   Procedure: Craniotomy - right Frontal interhemispheric approach for tumor resection with BRAIN LAB;  Surgeon: Vallarie Mare, MD;  Location: [place];  Service: Neurosurgery;  Laterality: Right;    Assessment & Plan Clinical Impression: Patient is a 70 y.o. year old right-handed femalewith history ofhyperlipidemia, hypertension, tremors. History taken from chart review, daughter, husband, and patient. Patient lives with spouse.Independentprior to admission. 1 level home 3steps to entrywith bilateral railings.She presented on 09/05/2020 with left-sided motor seizures and difficulty using her left arm and leg. 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 Patient underwent right frontal interhemispheric tumor resectioncraniotomy on 4/7/2022per Dr. Duffy Rhody. Decadronprotocol as indicated. She remainson Keppra for seizure disorder. Maintain on a regular diet. Therapy evaluations  completed due to patient's left-sided hemiparesisand tremors recommendations of physical medicine rehab consult.  Patient transferred to [facility] on 09/07/2020 .   Patient currently requires mod with mobility secondary to muscle weakness, decreased cardiorespiratoy endurance, impaired timing and sequencing, motor apraxia, decreased coordination and decreased motor planning, decreased visual perceptual skills and decreased visual motor skills, decreased midline orientation, decreased attention to left, left side neglect and decreased motor planning, decreased initiation, decreased attention, decreased awareness, decreased problem solving, decreased safety awareness and delayed processing and decreased sitting balance, decreased standing balance, decreased postural control, hemiplegia and decreased balance strategies.  Prior to hospitalization, patient was independent  with mobility and lived with Spouse in a House home.  Home access is 3 or 4Stairs to enter.  Patient will benefit from skilled PT intervention to maximize safe functional mobility, minimize fall risk and decrease caregiver burden for planned discharge home with 24 hour supervision.  Anticipate patient will benefit from follow up OP at discharge.  PT - End of Session Activity Tolerance: Tolerates 30+ min activity with multiple rests Endurance Deficit: Yes Endurance Deficit Description: generalized endurance PT Assessment Rehab Potential (ACUTE/IP ONLY): Good PT Barriers to Discharge: Inaccessible home environment;Decreased caregiver support;Lack of/limited family support;Behavior PT Barriers to Discharge Comments: Family can only provide supervision at d/c PT Patient demonstrates impairments in the following area(s): Balance;Pain;Behavior;Perception;Edema;Safety;Endurance;Sensory;Motor;Skin Integrity;Nutrition PT Transfers Functional Problem(s): Bed Mobility;Bed to Chair;Car;Furniture;Floor PT Locomotion Functional Problem(s):  Ambulation;Wheelchair Mobility;Stairs PT Plan PT Intensity: Minimum of 1-2 x/day ,45 to 90 minutes PT Frequency: 5 out of 7 days PT Duration Estimated Length of Stay: 14-16 days PT Treatment/Interventions: Ambulation/gait training;Cognitive remediation/compensation;Discharge planning;DME/adaptive equipment instruction;Functional mobility training;Pain management;Psychosocial support;Splinting/orthotics;Therapeutic Activities;UE/LE Strength taining/ROM;Visual/perceptual remediation/compensation;Wheelchair propulsion/positioning;UE/LE Coordination activities;Therapeutic Exercise;Stair training;Skin care/wound management;Neuromuscular re-education;Patient/family education;Functional electrical stimulation;Disease management/prevention;Community reintegration;Balance/vestibular training PT Transfers Anticipated Outcome(s): supervision using LRAD PT  Locomotion Anticipated Outcome(s): supervision >150 ft using LRAD PT Recommendation Recommendations for Other Services: Neuropsych consult;Therapeutic Recreation consult Therapeutic Recreation Interventions: Stress management Follow Up Recommendations: Outpatient PT Patient destination: Home Equipment Recommended: To be determined   PT Evaluation Precautions/Restrictions Precautions Precautions: Fall Precaution Comments: s/p tumor resection, L lean Restrictions Weight Bearing Restrictions: No Home Living/Prior Functioning Home Living Available Help at Discharge: Family;Available 24 hours/day Type of Home: House Home Access: Stairs to enter [employer] of Steps: 3 or 4 Entrance Stairs-Rails: Right;Left (L in garage, both in front of house) Home Layout: One level  Lives With: Spouse Prior Function Level of Independence: Independent with basic ADLs;Independent with transfers;Independent with gait;Independent with homemaking with ambulation  Able to Take Stairs?: Yes Driving: Yes (husband does mostly) Vocation: Retired Leisure:  Hobbies-yes (Comment) Comments: Loves to dance, working in the yard, and cooking Vision/Perception  Vision - Leisure centre manager Range of Motion: Restricted on the left Alignment/Gaze Preference: Gaze right Tracking/Visual Pursuits: Impaired - to be further tested in functional context;Requires cues, head turns, or add eye shifts to track;Decreased smoothness of horizontal tracking;Decreased smoothness of vertical tracking;Decreased smoothness of eye movement to RIGHT superior field;Decreased smoothness of eye movement to LEFT superior field;Decreased smoothness of eye movement to RIGHT inferior field;Decreased smoothness of eye movement to LEFT inferior field (able to locate target once target stops moving, but unable to track target in motion, slower to find target on the L) Saccades: Impaired - to be further tested in functional context;Additional eye shifts occurred during testing;Additional head turns occurred during testing Perception Perception: Impaired Inattention/Neglect: Does not attend to left side of body;Does not attend to left visual field Praxis Praxis: Impaired Praxis Impairment Details: Motor planning;Initiation  Cognition Overall Cognitive Status: Impaired/Different from baseline Arousal/Alertness: Awake/alert Attention: Selective Selective Attention: Impaired Selective Attention Impairment: Functional basic Memory: Impaired Memory Impairment: Decreased recall of new information Awareness: Impaired Awareness Impairment: Emergent impairment Problem Solving: Impaired Problem Solving Impairment: Functional basic Executive Function: Self Monitoring;Self Correcting Self Monitoring: Impaired Self Monitoring Impairment: Functional basic Self Correcting: Impaired Self Correcting Impairment: Functional basic Safety/Judgment: Impaired Sensation Sensation Light Touch: Appears Intact Proprioception: Impaired by gross assessment Coordination Gross Motor Movements are Fluid  and Coordinated: No Fine Motor Movements are Fluid and Coordinated: No Coordination and Movement Description: Limited by motor planning deficits and L inattention Heel Shin Test: Unable on L, crossed R leg over left after PT demonstrated test Motor  Motor Motor: Hemiplegia;Motor apraxia Motor - Skilled Clinical Observations: L hemi, limited mainly by inattention and poor proprioceptive awareness   Trunk/Postural Assessment  Cervical Assessment Cervical Assessment: Within Functional Limits Thoracic Assessment Thoracic Assessment: Exceptions to [facility] (kyphotic posture) Lumbar Assessment Lumbar Assessment: Exceptions to [facility] (posterior pelvic tilt) Postural Control Postural Control: Deficits on evaluation Righting Reactions: delayed  Balance Balance Balance Assessed: Yes Static Sitting Balance Static Sitting - Balance Support: Feet supported Static Sitting - Level of Assistance: 3: Mod assist Static Sitting - Comment/# of Minutes: L lean in sitting with poor midline orientation Dynamic Sitting Balance Dynamic Sitting - Balance Support: Feet supported Dynamic Sitting - Level of Assistance: 3: Mod assist Dynamic Sitting Balance - Compensations: Able to correct L lean to midline with cueing and visual target Static Standing Balance Static Standing - Balance Support: During functional activity Static Standing - Level of Assistance: 4: Min assist Dynamic Standing Balance Dynamic Standing - Balance Support: During functional activity;Right upper extremity supported;Left upper extremity supported Dynamic Standing - Level of Assistance: 4: Min assist Extremity Assessment  RLE Assessment RLE Assessment: Within Functional Limits Active Range of Motion (AROM) Comments: WFL for all functional mobility General Strength Comments: Grossly 4+/5 throughout in sitting LLE Assessment LLE Assessment: Exceptions to [facility] Active Range of Motion (AROM) Comments: WFL for all functional mobility General  Strength Comments: Grossly at least 3+/5 throughout with functional mobility, limited by L inattention and motor planning deficits to accurately perform formal strength testing  Care Tool Care Tool Bed Mobility Roll left and right activity   Roll left and right assist level: Moderate Assistance - Patient 50 - 74%    Sit to lying activity   Sit to lying assist level: Moderate Assistance - Patient 50 - 74%    Lying to sitting edge of bed activity   Lying to sitting edge of bed assist level: Moderate Assistance - Patient 50 - 74%     Care Tool Transfers Sit to stand transfer   Sit to stand assist level: Moderate Assistance - Patient 50 - 74%    Chair/bed transfer   Chair/bed transfer assist level: Moderate Assistance - Patient 50 - 74%     Physiological scientist transfer assist level: Moderate Assistance - Patient 50 - 74%      Care Tool Locomotion Ambulation   Assist level: Minimal Assistance - Patient > 75% Assistive device: Hand held assist Max distance: 67 ft  Walk 10 feet activity   Assist level: Minimal Assistance - Patient > 75% Assistive device: Hand held assist   Walk 50 feet with 2 turns activity   Assist level: Minimal Assistance - Patient > 75% Assistive device: Hand held assist  Walk 150 feet activity Walk 150 feet activity did not occur: Safety/medical concerns (decreased strength/activity tolerance)      Walk 10 feet on uneven surfaces activity Walk 10 feet on uneven surfaces activity did not occur: Safety/medical concerns (decreased strength/activity tolerance)      Stairs   Assist level: Minimal Assistance - Patient > 75% Stairs assistive device: 2 hand rails Max number of stairs: 4  Walk up/down 1 step activity   Walk up/down 1 step (curb) assist level: Minimal Assistance - Patient > 75% Walk up/down 1 step or curb assistive device: 2 hand rails    Walk up/down 4 steps activity Walk up/down 4 steps assist level: Minimal  Assistance - Patient > 75% Walk up/down 4 steps assistive device: 2 hand rails  Walk up/down 12 steps activity Walk up/down 12 steps activity did not occur: Safety/medical concerns (decreased strength/activity tolerance)      Pick up small objects from floor   Pick up small object from the floor assist level: Moderate Assistance - Patient 50 - 74% Pick up small object from the floor assistive device: none  Wheelchair   Type of Wheelchair: Manual   Wheelchair assist level: Total Assistance - Patient < 25% Max wheelchair distance: 2 ft without assist, 148 ft dependent  Wheel 50 feet with 2 turns activity   Assist Level: Dependent - Patient 0%;Total Assistance - Patient < 25%  Wheel 150 feet activity   Assist Level: Total Assistance - Patient < 25%    Refer to Care Plan for Long Term Goals  SHORT TERM GOAL WEEK 1 PT Short Term Goal 1 (Week 1): Patient will perform bed mobility with min a consitently. PT Short Term Goal 2 (Week 1): Patient will complete standardized balance assessment. PT Short Term Goal 3 (Week 1): Patient will perform  transfers with min A consistently. PT Short Term Goal 4 (Week 1): Patient will ambulate >100 ft with CGA using LRAD.  Recommendations for other services: Neuropsych and Therapeutic Recreation  Stress management  Skilled Therapeutic Intervention Evaluation completed (see details above and below) with education on PT POC and goals and individual treatment initiated with focus on functional mobility/transfers, LE strength, dynamic standing balance/coordination, ambulation, stair navigation, simulated car transfers, and improved endurance with activity Patient provided with 18"x18" wheelchair with standard cushion and adjustments made to promote optimal seating posture and pressure distribution. Patient also provided with RW for use in room and therapist adjusted to proper height for patient.  Patient in bed upon PT arrival. Patient alert and agreeable to PT  session. Patient denied pain during session. Reported increased fatigue this afternoon.  Therapeutic Activity: Bed Mobility: Patient performed rolling R/L and supine to/from sit with mod A. Required cues for initiation and attention to L side throughout. Performed supine to/from sit with mod A for trunk and lower extremity management due to delayed initiation and decreased motor planning. Provided verbal cues for initiation and sequencing. Bed mobility performed in a flat bed without use of bed rails. Transfers: Patient performed sit to/from stand x4 and stand pivot x2 without AD with mod A on the first trials, progressing to min A with repetition. Provided verbal cues for initiation, foot placement (R foot slides under her and L foot requires manual placement without heavy cues), forward weight shift, and visual target for hip orientation to complete turns with pivots.  Patient performed a simulated sedan height car transfer with mod A for trunk support and lower extremity management to turn in/out of the car due to motor planning and midline orientation deficits.  Gait Training:  Patient ambulated 67 feet with L HHA and 20 feet using RW with min A. Ambulated with reciprocal gait pattern, decreased gait speed, step length and hight bilaterally, guarded upper extremities without AD, and poor proximity to RW with AD. Provided assist for balance without AD and assist for AD management with AD due to decreased safety awareness and motor planning. Patient ascended/descended 4-6" steps using B rails with min A. Performed reciprocal gait pattern automatically. Provided facilitation for L hand placement on rail throughout and L foot placement on descent x2.  Wheelchair Mobility:  Patient propelled wheelchair 2 feet with several attempts at B upper extremity propulsion, poor coordination and motor apraxia of L>R upper extremities. Provided multimodal cues for hand placement and technique.   Neuromuscular  Re-ed: Patient performed the following sitting balance activities: -midline orientation on mat table and EOB 4-5 min each trial, focused on reducing L lean with visual targets for R shoulder, then progressed to avoiding target at L shoulder (cued "do not touch my shoulder), then progressing to correcting to midline with verbal cues only, progressed from min A to supervision each trial  Instructed pt in results of PT evaluation as detailed above, PT POC, rehab potential, rehab goals, and discharge recommendations. Additionally discussed CIR's policies regarding fall safety and use of chair alarm and/or quick release belt. Pt verbalized understanding and in agreement. Will update pt's family members as they become available.   Patient in bed at end of session with breaks locked, bed alarm set, and all needs within reach.   Discharge Criteria: Patient will be discharged from PT if patient refuses treatment 3 consecutive times without medical reason, if treatment goals not met, if there is a change in medical status, if patient makes  no progress towards goals or if patient is discharged from hospital.  The above assessment, treatment plan, treatment alternatives and goals were discussed and mutually agreed upon: by patient  Doreene Burke PT, DPT  09/08/2020, 4:59 PM

## 2020-09-08 NOTE — Progress Notes (Signed)
PROGRESS NOTE   Subjective/Complaints:  Slept poorly but this is usual for her We discussed CBG elevation and need for CBG and SSI while on steroids ROS- neg CP, SOB, N/V?D, + constip  Objective:   No results found. Recent Labs    09/05/20 1009 09/07/20 1912  WBC 8.8 21.3*  HGB 13.5 13.4  HCT 39.3 37.6  PLT 312 281   Recent Labs    09/05/20 1009 09/07/20 1912  NA 139 137  K 3.9 3.8  CL 104 103  CO2 28 26  GLUCOSE 96 145*  BUN 10 13  CREATININE 0.73 0.58  CALCIUM 9.4 8.6*   No intake or output data in the 24 hours ending 09/08/20 0710      Physical Exam: Vital Signs Blood pressure 127/75, pulse 71, temperature 98.3 F (36.8 C), temperature source Oral, resp. rate 14, weight 57.7 kg, SpO2 97 %.  General: No acute distress Mood and affect are appropriate Heart: Regular rate and rhythm no rubs murmurs or extra sounds Lungs: Clear to auscultation, breathing unlabored, no rales or wheezes Abdomen: Positive bowel sounds, soft nontender to palpation, nondistended Extremities: No clubbing, cyanosis, or edema Skin: No evidence of breakdown, no evidence of rash Motor 4/5 LUE and LLE, 5/5 in RUE and RLE  Musculoskeletal: Full range of motion in all 4 extremities. No joint swelling    Assessment/Plan: 1. Functional deficits which require 3+ hours per day of interdisciplinary therapy in a comprehensive inpatient rehab setting.  Physiatrist is providing close team supervision and 24 hour management of active medical problems listed below.  Physiatrist and rehab team continue to assess barriers to discharge/monitor patient progress toward functional and medical goals  Care Tool:  Bathing              Bathing assist       Upper Body Dressing/Undressing Upper body dressing        Upper body assist      Lower Body Dressing/Undressing Lower body dressing            Lower body assist        Toileting Toileting    Toileting assist Assist for toileting: Dependent - Patient 0%     Transfers Chair/bed transfer  Transfers assist  Chair/bed transfer activity did not occur: Safety/medical concerns        Locomotion Ambulation   Ambulation assist              Walk 10 feet activity   Assist           Walk 50 feet activity   Assist           Walk 150 feet activity   Assist           Walk 10 feet on uneven surface  activity   Assist           Wheelchair     Assist               Wheelchair 50 feet with 2 turns activity    Assist            Wheelchair 150 feet activity     Assist  Blood pressure 127/75, pulse 71, temperature 98.3 F (36.8 C), temperature source Oral, resp. rate 14, weight 57.7 kg, SpO2 97 %.  Medical Problem List and Plan: 1.  Decline in ADLs self care and cognition secondary to Right frontal lobe tumor             -patient may  Shower with shower cap             -ELOS/Goals: 10-12d 2.  Antithrombotics: -DVT/anticoagulation:  Mechanical: Sequential compression devices, below knee Bilateral lower extremities             -antiplatelet therapy: none 3. Pain Management: Tylenol prn 4. Mood: monitor , may need neuropsych eval             -antipsychotic agents: none 5. Neuropsych: This patient is not capable of making decisions on her own behalf. 6. Skin/Wound Care: Monitor incision frontal crani, no dressing needed 7. Fluids/Electrolytes/Nutrition: SLP eval, monitor fluid and caloric intake, poor appetite thus far may need IVF 8.  Cerebral edema, decadron taper 9.  Hx HTN- Cont lisinopril Vitals:   09/07/20 2021 09/08/20 0602  BP: 137/76 127/75  Pulse: 82 71  Resp: 14 14  Temp: 99.3 F (37.4 C) 98.3 F (36.8 C)  SpO2: 96% 97%    10.  Leukocytosis without fever or other symptoms of infx, likely decadron, will monitor during taper  11.  Hyperglycemia due to steroids,  initiate SSI , CBG tid 12.  Seizure d/o associated with frontal mass- cont Keppra 500mg  BID - no driving x 6 mo per [place] law will need to be cleared by neurosurg or neurology  At that time  LOS: 1 days A FACE TO Morton E Talah [place] 09/08/2020, 7:10 AM

## 2020-09-09 DIAGNOSIS — D496 Neoplasm of unspecified behavior of brain: Secondary | ICD-10-CM | POA: Diagnosis not present

## 2020-09-09 LAB — BASIC METABOLIC PANEL
Anion gap: 8 (ref 5–15)
BUN: 18 mg/dL (ref 8–23)
CO2: 27 mmol/L (ref 22–32)
Calcium: 9.1 mg/dL (ref 8.9–10.3)
Chloride: 100 mmol/L (ref 98–111)
Creatinine, Ser: 0.68 mg/dL (ref 0.44–1.00)
GFR, Estimated: >60 mL/min (ref >60)
Glucose, Bld: 138 mg/dL — ABNORMAL HIGH (ref 70–99)
Potassium: 3.9 mmol/L (ref 3.5–5.1)
Sodium: 135 mmol/L (ref 135–145)

## 2020-09-09 LAB — CBC WITH DIFFERENTIAL/PLATELET
Abs Immature Granulocytes: 0.06 10*3/uL (ref 0.00–0.07)
Basophils Absolute: 0 10*3/uL (ref 0.0–0.1)
Basophils Relative: 0 %
Eosinophils Absolute: 0.1 10*3/uL (ref 0.0–0.5)
Eosinophils Relative: 0 %
HCT: 41.5 % (ref 36.0–46.0)
Hemoglobin: 14.8 g/dL (ref 12.0–15.0)
Immature Granulocytes: 1 %
Lymphocytes Relative: 6 %
Lymphs Abs: 0.7 10*3/uL (ref 0.7–4.0)
MCH: 33.5 pg (ref 26.0–34.0)
MCHC: 35.7 g/dL (ref 30.0–36.0)
MCV: 93.9 fL (ref 80.0–100.0)
Monocytes Absolute: 0.7 10*3/uL (ref 0.1–1.0)
Monocytes Relative: 5 %
Neutro Abs: 10.9 10*3/uL — ABNORMAL HIGH (ref 1.7–7.7)
Neutrophils Relative %: 88 %
Platelets: 340 10*3/uL (ref 150–400)
RBC: 4.42 MIL/uL (ref 3.87–5.11)
RDW: 11.5 % (ref 11.5–15.5)
WBC: 12.4 10*3/uL — ABNORMAL HIGH (ref 4.0–10.5)
nRBC: 0 % (ref 0.0–0.2)

## 2020-09-09 LAB — GLUCOSE, CAPILLARY
Glucose-Capillary: 111 mg/dL — ABNORMAL HIGH (ref 70–99)
Glucose-Capillary: 113 mg/dL — ABNORMAL HIGH (ref 70–99)
Glucose-Capillary: 136 mg/dL — ABNORMAL HIGH (ref 70–99)
Glucose-Capillary: 107 mg/dL — ABNORMAL HIGH (ref 70–99)

## 2020-09-09 MED ORDER — BISACODYL 10 MG RE SUPP
10.0000 mg | Freq: Every day | RECTAL | Status: DC | PRN
  Administered 2020-09-16: 10 mg via RECTAL
  Filled 2020-09-09: qty 1

## 2020-09-09 MED ORDER — ALUM & MAG HYDROXIDE-SIMETH 200-200-20 MG/5ML PO SUSP: 30.0000 mL | ORAL | Status: DC | PRN

## 2020-09-09 MED ORDER — FLEET ENEMA 7-19 GM/118ML RE ENEM: 1.0000 | ENEMA | Freq: Every day | RECTAL | Status: DC | PRN

## 2020-09-09 MED ORDER — TRAZODONE HCL 50 MG PO TABS
75.0000 mg | ORAL_TABLET | Freq: Every evening | ORAL | Status: DC | PRN
  Administered 2020-09-09 – 2020-09-12 (×4): 75 mg via ORAL
  Filled 2020-09-09 (×4): qty 2

## 2020-09-09 MED ORDER — ACETAMINOPHEN-CODEINE #3 300-30 MG PO TABS: 1.0000 | ORAL_TABLET | ORAL | Status: DC | PRN

## 2020-09-09 NOTE — Progress Notes (Signed)
PROGRESS NOTE   Subjective/Complaints: No complaints this morning. Denies pain, constipation Still sleeping poorly, discussed increasing trazodone to 75mg .   ROS- neg CP, SOB, N/V?D, +insomnia  Objective:   No results found. Recent Labs    09/07/20 1912 09/09/20 1036  WBC 21.3* 12.4*  HGB 13.4 14.8  HCT 37.6 41.5  PLT 281 340   Recent Labs    09/07/20 1912 09/09/20 1036  NA 137 135  K 3.8 3.9  CL 103 100  CO2 26 27  GLUCOSE 145* 138*  BUN 13 18  CREATININE 0.58 0.68  CALCIUM 8.6* 9.1    Intake/Output Summary (Last 24 hours) at 09/09/2020 1637 Last data filed at 09/09/2020 0803 Gross per 24 hour  Intake 420 ml  Output --  Net 420 ml        Physical Exam: Vital Signs Blood pressure 136/85, pulse 65, temperature 98.2 F (36.8 C), temperature source Oral, resp. rate 18, height 5\' 6"  (1.676 m), weight 57.7 kg, SpO2 99 %. Gen: no distress, normal appearing HEENT: oral mucosa pink and moist, NCAT Cardio: Reg rate Chest: normal effort, normal rate of breathing Abd: soft, non-distended Ext: no edema Psych: pleasant, normal affect Skin: No evidence of breakdown, no evidence of rash Motor 4/5 LUE and LLE, 5/5 in RUE and RLE  Musculoskeletal: Full range of motion in all 4 extremities. No joint swelling    Assessment/Plan: 1. Functional deficits which require 3+ hours per day of interdisciplinary therapy in a comprehensive inpatient rehab setting.  Physiatrist is providing close team supervision and 24 hour management of active medical problems listed below.  Physiatrist and rehab team continue to assess barriers to discharge/monitor patient progress toward functional and medical goals  Care Tool:  Bathing    Body parts bathed by patient: Chest,Abdomen,Right upper leg,Left upper leg,Face   Body parts bathed by helper: Right arm,Front perineal area,Buttocks,Left arm,Right lower leg,Left lower leg      Bathing assist Assist Level: Maximal Assistance - Patient 24 - 49%     Upper Body Dressing/Undressing Upper body dressing   What is the patient wearing?: Pull over shirt    Upper body assist Assist Level: Maximal Assistance - Patient 25 - 49%    Lower Body Dressing/Undressing Lower body dressing      What is the patient wearing?: Pants,Incontinence brief     Lower body assist Assist for lower body dressing: Maximal Assistance - Patient 25 - 49%     Toileting Toileting    Toileting assist Assist for toileting: Moderate Assistance - Patient 50 - 74%     Transfers Chair/bed transfer  Transfers assist  Chair/bed transfer activity did not occur: Safety/medical concerns  Chair/bed transfer assist level: Maximal Assistance - Patient 25 - 49%     Locomotion Ambulation   Ambulation assist      Assist level: Minimal Assistance - Patient > 75% Assistive device: Walker-rolling Max distance: 153ft   Walk 10 feet activity   Assist     Assist level: Minimal Assistance - Patient > 75% Assistive device: Walker-rolling   Walk 50 feet activity   Assist    Assist level: Minimal Assistance - Patient > 75% Assistive  device: Walker-rolling    Walk 150 feet activity   Assist Walk 150 feet activity did not occur: Safety/medical concerns (decreased strength/activity tolerance)         Walk 10 feet on uneven surface  activity   Assist Walk 10 feet on uneven surfaces activity did not occur: Safety/medical concerns (decreased strength/activity tolerance)         Wheelchair     Assist   Type of Wheelchair: Manual    Wheelchair assist level: Total Assistance - Patient < 25% Max wheelchair distance: 2 ft without assist, 148 ft dependent    Wheelchair 50 feet with 2 turns activity    Assist        Assist Level: Dependent - Patient 0%,Total Assistance - Patient < 25%   Wheelchair 150 feet activity     Assist      Assist Level: Total  Assistance - Patient < 25%   Blood pressure 136/85, pulse 65, temperature 98.2 F (36.8 C), temperature source Oral, resp. rate 18, height 5\' 6"  (1.676 m), weight 57.7 kg, SpO2 99 %.  Medical Problem List and Plan: 1.  Decline in ADLs self care and cognition secondary to Right frontal lobe tumor             -patient may  Shower with shower cap             -ELOS/Goals: 10-12d  Continue CIR 2.  Impaired mobility: -DVT/anticoagulation:  Mechanical: Continue Sequential compression devices, below knee Bilateral lower extremities             -antiplatelet therapy: none 3. Pain Management: Tylenol prn 4. Mood: monitor , may need neuropsych eval             -antipsychotic agents: none 5. Neuropsych: This patient is not capable of making decisions on her own behalf. 6. Skin/Wound Care: Monitor incision frontal crani, no dressing needed 7. Fluids/Electrolytes/Nutrition: SLP eval, monitor fluid and caloric intake, poor appetite thus far may need IVF 8.  Cerebral edema, decadron taper 9.  Hx HTN- Continue lisinopril Vitals:   09/09/20 0326 09/09/20 1319  BP: 124/72 136/85  Pulse: (!) 59 65  Resp: 18 18  Temp: 98.2 F (36.8 C) 98.2 F (36.8 C)  SpO2: 98% 99%    10.  Leukocytosis without fever or other symptoms of infx, likely decadron, will monitor during taper  11.  Hyperglycemia due to steroids, initiate SSI , CBG tid 12.  Seizure d/o associated with frontal mass- cont Keppra 500mg  BID - no driving x 6 mo per [place] law will need to be cleared by neurosurg or neurology  At that time  13. Insomnia: increase trazodone to 75mg .   LOS: 2 days A FACE TO FACE EVALUATION WAS PERFORMED  Darlene Marsh 09/09/2020, 4:37 PM

## 2020-09-09 NOTE — Progress Notes (Signed)
Speech Language Pathology Daily Session Note  Patient Details  Name: Darlene Marsh MRN: [id number] Date of Birth: 12-Jun-1950  Today's Date: 09/09/2020 SLP Individual Time: 1025-1055 SLP Individual Time Calculation (min): 30 min  Short Term Goals: Week 1: SLP Short Term Goal 1 (Week 1): Pt will recall daily information with min assist cues for use of memory compensatory strategies. SLP Short Term Goal 2 (Week 1): Pt will selectively attend to tasks for 5-7 minute intervals with min cues for redirection. SLP Short Term Goal 3 (Week 1): Pt will complete mildly complex tasks with min verbal cues for functional problem solving.  Skilled Therapeutic Interventions: Skilled treatment session focused on cognitive goals. SLP facilitated session by providing Mod A verbal cues for sustained attention and functional problem solving during a mildly complex appointment making task. Patient was clearly demonstrating difficulty reading due to decreased visual acuity but did not initiate conversation about glasses. SLP provided reading glasses and patient's performance improved mildly. Task was unable to be completed this session, therefore, SLP will complete during next session. Patient left upright in recliner with alarm on and all needs within reach. Continue with current plan of care.      Pain No/Denies Pain   Therapy/Group: Individual Therapy  Kaleab Frasier 09/09/2020, 12:22 PM

## 2020-09-09 NOTE — Progress Notes (Signed)
Inpatient Rehabilitation  Patient information reviewed and entered into eRehab system by Kharis Lapenna M. Corie Allis, M.A., CCC/SLP, PPS Coordinator.  Information including medical coding, functional ability and quality indicators will be reviewed and updated through discharge.    

## 2020-09-09 NOTE — Progress Notes (Signed)
Inpatient Rehabilitation Care Coordinator Assessment and Plan Patient Details  Name: Darlene Marsh MRN: [id number] Date of Birth: November 20, 1950  Today's Date: 09/09/2020  Hospital Problems: Principal Problem:   Brain tumor [facility])  Past Medical History:  Past Medical History:  Diagnosis Date  . Hyperlipidemia   . Hypertension   . Thyroid disease    underactive  . Tremors of nervous system    Past Surgical History:  Past Surgical History:  Procedure Laterality Date  . APPLICATION OF CRANIAL NAVIGATION Right 09/05/2020   Procedure: APPLICATION OF CRANIAL NAVIGATION;  Surgeon: Vallarie Mare, MD;  Location: [facility];  Service: Neurosurgery;  Laterality: Right;  . CRANIOTOMY Right 09/05/2020   Procedure: Craniotomy - right Frontal interhemispheric approach for tumor resection with BRAIN LAB;  Surgeon: Vallarie Mare, MD;  Location: [facility];  Service: Neurosurgery;  Laterality: Right;   Social History:  reports that she has never smoked. She has never used smokeless tobacco. She reports that she does not drink alcohol and does not use drugs.  Family / Support Systems Marital Status: Married Patient Roles: Spouse,Parent Spouse/Significant Other: Arnell Sieving [phone] Children: Christine-daughter [phone] Other Supports: Two other children close by and will check on Anticipated Caregiver: husband Ability/Limitations of Caregiver: Husband in good health and can assist if needed Caregiver Availability: 24/7 Family Dynamics: Close knit family who pull together when tmes of difficulty. Pt has been very active and independent and this was a surprise to all of them. They have freinds and church member support also.  Social History Preferred language: English Religion:  Cultural Background: No issues Education: HS Read: Yes Write: Yes Employment Status: Retired Public relations account executive Issues: No issues Guardian/Conservator: None-according to MD pt is not fully capable of making  her own decisions while here. Will look toward her husband to make any decisions while here   Abuse/Neglect Abuse/Neglect Assessment Can Be Completed: Yes Physical Abuse: Denies Verbal Abuse: Denies Sexual Abuse: Denies Exploitation of patient/patient's resources: Denies Self-Neglect: Denies  Emotional Status Pt's affect, behavior and adjustment status: Pt is motivated to do well and feels she has a good start, moving better. She has always been independent and active and liked moving-dancing. 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 She finds them fun Recent Psychosocial Issues: pt thought she was healthy did not go to the MD except [facility] Psychiatric History: No history deferred depression screen will wait until more oriented and able to fully participate. Will get input from team regarding if neuro-psych needed. Substance Abuse History: No issues  Patient / Family Perceptions, Expectations & Goals Pt/Family understanding of illness & functional limitations: Pt and husband can explain her surgery and deficits, husband talks with the MD to get his questions answered and concerns addressed. Both are pleased with how well she is doing since surgery. Premorbid pt/family roles/activities: Wife, Mom, grnadmother, Tourist information centre manager, tiktok Interior and spatial designer, etc Anticipated changes in roles/activities/participation: resume Pt/family expectations/goals: Pt states: " I want to be able to do for myself.'  Husband states: " I hope she can move and be as close to independent as she can be."  [employer]: None Premorbid Home Care/DME Agencies: None Transportation available at discharge: Husband will be driving now, until pt cleared she was PTA  Discharge Planning Living Arrangements: Spouse/significant other [place]: Spouse/significant other,Children,Friends/neighbors,Church/faith community Type of Residence: Private residence Insurance  Resources: Chartered certified accountant Resources: [place] Referred: No Living Expenses: Own Money Management:  Patient,Spouse Does the patient have any problems obtaining your medications?: No (Needs PCP at DC) Home Management: Both she and husband Patient/Family Preliminary Plans: Return home with husband who can provide assist if needed. He is healthy and able. They have three children who are involved and supportive and will check on them. Aware of team's goals and are looking forward to team conference regarding target DC date. Care Coordinator Anticipated Follow Up Needs: HH/OP  Clinical Impression Pleasant female who was very active prior to admission and wants to get back to dancing. Her husband is retired and in good health and can assist if needed. Will meet with tomorrow after team conference to update on target discharge date. Continue to assess if pt would benefit from seeing neuro-psych while here  Elease Hashimoto 09/09/2020, 11:50 AM

## 2020-09-09 NOTE — Progress Notes (Cosign Needed)
[facility] Individual Statement of Services  Patient Name:  Thamara Leger  Date:  09/09/2020  Welcome to the [facility].  Our goal is to provide you with an individualized program based on your diagnosis and situation, designed to meet your specific needs.  With this comprehensive rehabilitation program, you will be expected to participate in at least 3 hours of rehabilitation therapies Monday-Friday, with modified therapy programming on the weekends.  Your rehabilitation program will include the following services:  Physical Therapy (PT), Occupational Therapy (OT), Speech Therapy (ST), 24 hour per day rehabilitation nursing, Neuropsychology, Care Coordinator, Rehabilitation Medicine, Nutrition Services and Pharmacy Services  Weekly team conferences will be held on Tuesday to discuss your progress.  Your Inpatient Rehabilitation Care Coordinator will talk with you frequently to get your input and to update you on team discussions.  Team conferences with you and your family in attendance may also be held.  Expected length of stay: 14-16 days  Overall anticipated outcome: supervision with cues  Depending on your progress and recovery, your program may change. Your Inpatient Rehabilitation Care Coordinator will coordinate services and will keep you informed of any changes. Your Inpatient Rehabilitation Care Coordinator's name and contact numbers are listed  below.  The following services may also be recommended but are not provided by the [facility] will be made to provide these services after discharge if needed.  Arrangements include referral to agencies that provide these services.  Your insurance has been verified to be:  Medicare Your primary doctor is:  None  Pertinent information will be shared with your doctor  and your insurance company.  Inpatient Rehabilitation Care Coordinator:  Ovidio Kin, [place] or Emilia Beck  Information discussed with and copy given to patient by: Elease Hashimoto, 09/09/2020, 11:34 AM

## 2020-09-09 NOTE — Progress Notes (Signed)
Physical Therapy Session Note  Patient Details  Name: Darlene Marsh MRN: [id number] Date of Birth: February 24, 1951  Today's Date: 09/09/2020 PT Individual Time: 0900-1011 PT Individual Time Calculation (min): 71 min   Short Term Goals: Week 1:  PT Short Term Goal 1 (Week 1): Patient will perform bed mobility with min a consitently. PT Short Term Goal 2 (Week 1): Patient will complete standardized balance assessment. PT Short Term Goal 3 (Week 1): Patient will perform transfers with min A consistently. PT Short Term Goal 4 (Week 1): Patient will ambulate >100 ft with CGA using LRAD.  Skilled Therapeutic Interventions/Progress Updates:   Received pt supine in bed with daughter and husband present at bedside, pt agreeable to therapy, and denied any pain during session but reported not sleeping well last night. Session with emphasis on dressing, functional mobility/transfers, generalized strengthening, dynamic standing balance/coordination, gait training, and improved activity tolerance. Pt reported being able to tell if she has to go to the bathroom but then reported having accidents; therefore checked brief and pt found to be incontinent. Removed soiled brief and pt rolled L and R with heavy min A and donned clean brief with total A. Donned pants in supine with max A and doffed non-skid socks with supervision but required max A to don clean ones. Pt transferred supine<>sitting EOB with mod A. Pt with L lateral and mild posterior lean while sitting EOB. Pt aware of lean but unable to correct it. Doffed dirty shirt with max A and donned clean undershirt and button up shirt with max A. Encouraged pt to use LUE throughout dressing as pt with L inattention. Stand<>pivot bed<>WC without AD and mod A and pt sat in [facility] and brushed teeth with supervision and encouragement to use LUE. Pt transported to 4W therapy gym in [facility] total A for time management purposes. Pt transferred sit<>stand with RW and mod A and ambulated  171ft with RW and min A. Pt demonstrates decreased cadence and decreased LLE step height/length. Pt then performed lateral side stepping at rail for 67ft with min A overall but required max A to advance LLE when stepping to L due to difficulty weight shifting and mod cues to slide LUE along rail as pt often forgetting to advance it. Pt with difficulty sequencing movements and required max cues for foot position and stepping technique. Pt transported back to room in [facility] total A and ambulated 65ft with min handheld assist to recliner. Pt requires cues to step back completely to recliner prior to sitting. Concluded session with pt sitting in recliner, needs within reach, and chair pad alarm on.   Therapy Documentation Precautions:  Precautions Precautions: Fall Precaution Comments: s/p tumor resection, L lean Restrictions Weight Bearing Restrictions: No   Therapy/Group: Individual Therapy Alfonse Alpers PT, DPT   09/09/2020, 7:29 AM

## 2020-09-09 NOTE — Progress Notes (Signed)
Trazodone 50 mg given at hs for sleep-partial effects noted. Pt noted to wake up in early morning hours. One attempt made by patient to get out of bed unassisted. Pt reminded to call for assistance when needing to get out of bed. Pt toileted and assisted back to bed.

## 2020-09-09 NOTE — Progress Notes (Signed)
Occupational Therapy Session Note  Patient Details  Name: Darlene Marsh MRN: [id number] Date of Birth: 1950/11/19  Today's Date: 09/09/2020 OT Individual Time: 8144-8185 OT Individual Time Calculation (min): 71 min    Short Term Goals: Week 1:  OT Short Term Goal 1 (Week 1): Pt will don shirt with min A OT Short Term Goal 2 (Week 1): Pt will complete toilet transfer with CGA OT Short Term Goal 3 (Week 1): Pt will require no more than min cueing for attention to L arm during bathing  Skilled Therapeutic Interventions/Progress Updates:    Treatment session with focus on self-care retraining, functional mobility, L attention, and motor planning.  Pt received slumped over in recliner with little to no awareness of positioning.  Pt reports attempting to call back her husband or daughter but unsure who had called her earlier.  Therapist obtained phone numbers for family members from chart.  Pt required max cues for problem solving and sequencing when dialing phone number.  Pt spoke with daughter for a few mins.  Pt expressing to daughter desire to bathe, therefore therapist inquired about pt taking a shower.  Pt agreeable to shower, asking about washing hair - therapist will clarify with MD during conference tomorrow.  Pt required mod assist sit > stand due to decreased motor planning and attention to L.  Ambulated to room shower with RW with min assist and then mod assist to sequence turning to sit on shower bench.  Pt required max assist for scooting to L in to shower.  Pt required max multimodal cues for sequencing and attention to L side of body during bathing.  Mod assist sit > stand to allow therapist to wash buttocks.  Mod assist transfer back out of shower with max multimodal cues for sequencing.  Pt ambulated back to recliner with RW min assist until turning and having to step backwards a few steps then requiring mod-max assist for weight shifting and moving LLE.  Pt required max assist for dressing  due to L inattention, difficulty moving LUE and LLE.  Mod assist sit > stand to pull pants over hips.  Pt returned to sitting upright in recliner with legs elevated, chair alarm on, and all needs in reach.  Therapy Documentation Precautions:  Precautions Precautions: Fall Precaution Comments: s/p tumor resection, L lean Restrictions Weight Bearing Restrictions: No General:   Vital Signs: Therapy Vitals Temp: 98.2 F (36.8 C) Temp Source: Oral Pulse Rate: 65 Resp: 18 BP: 136/85 Patient Position (if appropriate): Sitting Oxygen Therapy SpO2: 99 % O2 Device: Room Air Pain: Pain Assessment Pain Scale: 0-10 Pain Score: 4  Pain Type: Acute pain Pain Location: Head Pain Orientation: Anterior Pain Descriptors / Indicators: Headache Pain Frequency: Occasional Pain Onset: Gradual Pain Intervention(s): Medication (See eMAR)   Therapy/Group: Individual Therapy  Simonne Come 09/09/2020, 3:58 PM

## 2020-09-10 DIAGNOSIS — D496 Neoplasm of unspecified behavior of brain: Secondary | ICD-10-CM | POA: Diagnosis not present

## 2020-09-10 LAB — GLUCOSE, CAPILLARY
Glucose-Capillary: 95 mg/dL (ref 70–99)
Glucose-Capillary: 118 mg/dL — ABNORMAL HIGH (ref 70–99)
Glucose-Capillary: 115 mg/dL — ABNORMAL HIGH (ref 70–99)
Glucose-Capillary: 130 mg/dL — ABNORMAL HIGH (ref 70–99)

## 2020-09-10 MED ORDER — TOPIRAMATE 25 MG PO TABS
25.0000 mg | ORAL_TABLET | Freq: Every day | ORAL | Status: DC
  Administered 2020-09-10 – 2020-09-11 (×2): 25 mg via ORAL
  Filled 2020-09-10 (×2): qty 1

## 2020-09-10 NOTE — Progress Notes (Signed)
Speech Language Pathology Daily Session Note  Patient Details  Name: Myrka Sylva MRN: [id number] Date of Birth: 05/10/1951  Today's Date: 09/10/2020 SLP Individual Time: 1430-1505 SLP Individual Time Calculation (min): 35 min  Short Term Goals: Week 1: SLP Short Term Goal 1 (Week 1): Pt will recall daily information with min assist cues for use of memory compensatory strategies. SLP Short Term Goal 2 (Week 1): Pt will selectively attend to tasks for 5-7 minute intervals with min cues for redirection. SLP Short Term Goal 3 (Week 1): Pt will complete mildly complex tasks with min verbal cues for functional problem solving.  Skilled Therapeutic Interventions: Skilled treatment session focused on cognitive goals. Upon arrival, patient was awake in bed but appeared lethargic. Patient's daughter present and concerned that patient did not receive therapy per patient's report. SLP provided Max verbal cues for recall of events in both PT and OT sessions she had this morning. SLP facilitated session by initiating use of a memory notebook to facilitate recall. Patient was unable to write legibly in the notebook, therefore, SLP wrote down pertinent information in which patient was able to read with Min verbal cues and extra time. Patient also required extra time and Max A multimodal cues for functional use of her LUE throughout tasks. Throughout session, Max A multimodal cues were needed for visual scaning to the left and to make eye contact with the clinician within her left visual field. Patient left upright in bed with alarm on and all needs within reach. Continue with current plan of care.      Pain No/Denies Pain   Therapy/Group: Individual Therapy  Rand Etchison, [place] 09/10/2020, 3:21 PM

## 2020-09-10 NOTE — Progress Notes (Signed)
PROGRESS NOTE   Subjective/Complaints: Complains of headache Supervision goals Team conference today No other complaints this morning  ROS- neg CP, SOB, N/V?D, +insomnia, +headache  Objective:   No results found. Recent Labs    09/07/20 1912 09/09/20 1036  WBC 21.3* 12.4*  HGB 13.4 14.8  HCT 37.6 41.5  PLT 281 340   Recent Labs    09/07/20 1912 09/09/20 1036  NA 137 135  K 3.8 3.9  CL 103 100  CO2 26 27  GLUCOSE 145* 138*  BUN 13 18  CREATININE 0.58 0.68  CALCIUM 8.6* 9.1   No intake or output data in the 24 hours ending 09/10/20 0949      Physical Exam: Vital Signs Blood pressure (!) 141/69, pulse 63, temperature 99.2 F (37.3 C), temperature source Oral, resp. rate 20, height 5\' 6"  (1.676 m), weight 57.7 kg, SpO2 97 %. Gen: no distress, normal appearing HEENT: oral mucosa pink and moist, NCAT Cardio: Reg rate Chest: normal effort, normal rate of breathing Abd: soft, non-distended Ext: no edema Musculoskeletal: Psych: pleasant, normal affect Skin: No evidence of breakdown, no evidence of rash Motor 4/5 LUE and LLE, 5/5 in RUE and RLE  Musculoskeletal/Neuro: Full range of motion in all 4 extremities. No joint swelling, left sided neglect  Assessment/Plan: 1. Functional deficits which require 3+ hours per day of interdisciplinary therapy in a comprehensive inpatient rehab setting.  Physiatrist is providing close team supervision and 24 hour management of active medical problems listed below.  Physiatrist and rehab team continue to assess barriers to discharge/monitor patient progress toward functional and medical goals  Care Tool:  Bathing    Body parts bathed by patient: Chest,Abdomen,Right upper leg,Left upper leg,Face   Body parts bathed by helper: Right arm,Front perineal area,Buttocks,Left arm,Right lower leg,Left lower leg     Bathing assist Assist Level: Maximal Assistance - Patient  24 - 49%     Upper Body Dressing/Undressing Upper body dressing   What is the patient wearing?: Pull over shirt    Upper body assist Assist Level: Maximal Assistance - Patient 25 - 49%    Lower Body Dressing/Undressing Lower body dressing      What is the patient wearing?: Pants,Incontinence brief     Lower body assist Assist for lower body dressing: Maximal Assistance - Patient 25 - 49%     Toileting Toileting    Toileting assist Assist for toileting: Moderate Assistance - Patient 50 - 74%     Transfers Chair/bed transfer  Transfers assist  Chair/bed transfer activity did not occur: Safety/medical concerns  Chair/bed transfer assist level: Maximal Assistance - Patient 25 - 49%     Locomotion Ambulation   Ambulation assist      Assist level: Minimal Assistance - Patient > 75% Assistive device: Walker-rolling Max distance: 120ft   Walk 10 feet activity   Assist     Assist level: Minimal Assistance - Patient > 75% Assistive device: Walker-rolling   Walk 50 feet activity   Assist    Assist level: Minimal Assistance - Patient > 75% Assistive device: Walker-rolling    Walk 150 feet activity   Assist Walk 150 feet activity did not occur:  Safety/medical concerns (decreased strength/activity tolerance)         Walk 10 feet on uneven surface  activity   Assist Walk 10 feet on uneven surfaces activity did not occur: Safety/medical concerns (decreased strength/activity tolerance)         Wheelchair     Assist Will patient use wheelchair at discharge?: No (Per PT long term goals) Type of Wheelchair: Manual    Wheelchair assist level: Total Assistance - Patient < 25% Max wheelchair distance: 2 ft without assist, 148 ft dependent    Wheelchair 50 feet with 2 turns activity    Assist        Assist Level: Dependent - Patient 0%,Total Assistance - Patient < 25%   Wheelchair 150 feet activity     Assist      Assist  Level: Total Assistance - Patient < 25%   Blood pressure (!) 141/69, pulse 63, temperature 99.2 F (37.3 C), temperature source Oral, resp. rate 20, height 5\' 6"  (1.676 m), weight 57.7 kg, SpO2 97 %.  Medical Problem List and Plan: 1.  Decline in ADLs self care and cognition secondary to Right frontal lobe tumor             -patient may  Shower with shower cap             -ELOS/Goals: 10-12d  Continue CIR 2.  Impaired mobility: -DVT/anticoagulation:  Mechanical: Continue Sequential compression devices, below knee Bilateral lower extremities             -antiplatelet therapy: none 3. Pain Management: Tylenol prn. Add topamax 25mg  HS for headache and insomnia 4. Mood: monitor , may need neuropsych eval             -antipsychotic agents: none 5. Neuropsych: This patient is not capable of making decisions on her own behalf. 6. Skin/Wound Care: Monitor incision frontal crani, no dressing needed 7. Fluids/Electrolytes/Nutrition: SLP eval, monitor fluid and caloric intake, poor appetite thus far may need IVF 8.  Cerebral edema, decadron taper 9.  Hx HTN- Continue lisinopril Vitals:   09/09/20 1950 09/10/20 0406  BP: 131/71 (!) 141/69  Pulse: 71 63  Resp: 18 20  Temp: 98.4 F (36.9 C) 99.2 F (37.3 C)  SpO2: 96% 97%  10.  Leukocytosis without fever or other symptoms of infx, likely decadron, will monitor during taper  11.  Hyperglycemia due to steroids, initiate SSI , CBG tid 12.  Seizure d/o associated with frontal mass- cont Keppra 500mg  BID - no driving x 6 mo per [place] law will need to be cleared by neurosurg or neurology  At that time  13. Insomnia: increase trazodone to 75mg .  14. Urinary incontinence: will check UA/UC  LOS: 3 days A FACE TO FACE EVALUATION WAS PERFORMED  Clide Deutscher Breck [place] 09/10/2020, 9:49 AM

## 2020-09-10 NOTE — Progress Notes (Addendum)
Physical Therapy Session Note  Patient Details  Name: Darlene Marsh MRN: [id number] Date of Birth: 12/28/1950  Today's Date: 09/10/2020 PT Individual Time: 4970-2637 PT Individual Time Calculation (min): 54 min   Short Term Goals: Week 1:  PT Short Term Goal 1 (Week 1): Patient will perform bed mobility with min a consitently. PT Short Term Goal 2 (Week 1): Patient will complete standardized balance assessment. PT Short Term Goal 3 (Week 1): Patient will perform transfers with min A consistently. PT Short Term Goal 4 (Week 1): Patient will ambulate >100 ft with CGA using LRAD.  Skilled Therapeutic Interventions/Progress Updates:   Received pt sitting in recliner finishing getting cleaned up with OT, pt agreeable to therapy, and denied any pain during session but reported not sleeping well last night and feeling fatigued. Session with emphasis on functional mobility/transfers, generalized strengthening, dynamic standing balance/coordination, NMR, toileting, and improved activity tolerance. Pt unable to sequence scooting to edge of recliner requiring total A to scoot hips forward. Pt transferred recliner<>WC with max A due to impaired motor planning/sequencing and difficulty moving LLE. Pt transported to 39M ortho gym in [facility] total A for time management purposes and transferred WC<>mat stand<>pivot with max A and mat<>WC squat<>pivot with max A. Noted pt with increased LUE flexor synergy; encouraged elbow and wrist extension weight bearing through LUE and pt required max cues for active participation; noted pt with significant L inattention avoiding L cervical rotation to look at therapist. Pt transferred sit<>stand with RW and heavy mod A x 2 and worked on upright posture, midline orientation, and L knee extension performing pre-gait stepping with RLE with mod A overall for balance using mirror for visual feedback x 5 reps. Pt unable to fully extend R knee but no buckling noted. Upon return to sit to rest  pt with increased difficulty motor planning requiring increased time and max cues to sit on mat. Pt also frequently returning to sitting upon standing up. Pt reported "tightness" in stomach and soiling brief and was transported back to room in [facility] total A and attempted to stand in [place]. Despite therapist providing total A, pt unable to stand in stedy therefore transferred WC<>bed squat<>pivot with total A and sit<>supine with max A for LE management and trunk control. Pt required total A to reposition in bed. Pt rolled L and R with max/total A and doffed pants and brief with total A and pt with small BM smear in brief. Pt required total A for peri care and to don clean brief and pants. Scooted pt to [facility] with total A of 1. Concluded session with pt supine in bed, needs within reach, and bed alarm on.   Therapy Documentation Precautions:  Precautions Precautions: Fall Precaution Comments: s/p tumor resection, L lean Restrictions Weight Bearing Restrictions: No  Therapy/Group: Individual Therapy Alfonse Alpers PT, DPT  09/10/2020, 7:25 AM

## 2020-09-10 NOTE — Progress Notes (Signed)
Occupational Therapy Session Note  Patient Details  Name: Darlene Marsh MRN: [id number] Date of Birth: 1951-03-19  Today's Date: 09/10/2020 OT Individual Time: 1000-1103 OT Individual Time Calculation (min): 63 min    Short Term Goals: Week 1:  OT Short Term Goal 1 (Week 1): Pt will don shirt with min A OT Short Term Goal 2 (Week 1): Pt will complete toilet transfer with CGA OT Short Term Goal 3 (Week 1): Pt will require no more than min cueing for attention to L arm during bathing  Skilled Therapeutic Interventions/Progress Updates:    Treatment session with focus on initiation, motor planning, and sequencing during self-care retraining tasks. Pt received upright in bed with son and daughter present visiting.  Pt's family members exited to allow pt to focus on therapy session.  Pt expressing desire to change clothes, but not wanting to bathe this session.  Focus placed on initiation and motor planning with undressing and dressing.  Pt requiring max multimodal cues and max-total assistance with dressing tasks even when allowed increased time for initiation and redirection to task at hand.  Pt reports need to toilet.  Completed stand pivot transfer recliner > BSC with RW with total assist to facilitate anterior weight shift and lift off, once pt up ~50% pt then initiating stand.  Pt completed stand pivot transfers with mod assist and facilitation to move RW and facilitate sequencing.  Pt demonstrating increased difficulty with attention and sequencing this session, requiring max multimodal cues throughout all tasks. Pt passed off to PT for next session.  Therapy Documentation Precautions:  Precautions Precautions: Fall Precaution Comments: s/p tumor resection, L lean Restrictions Weight Bearing Restrictions: No Pain:  Pt with no c/o pain   Therapy/Group: Individual Therapy  Simonne Come 09/10/2020, 12:21 PM

## 2020-09-10 NOTE — Progress Notes (Signed)
Patient ID: Darlene Marsh, female   DOB: 03-Jul-1950, 70 y.o.   MRN: [id number]  Contacted husband via telephone to inform of team conference update goals of supervision level and target discharge 4/26. Husband reports she ate better this am since he and daughter are concerned with her poor po intake also along with team. Pt is still adjusting to the new unit. Husband will be her primary caregiver at discharge and will come in when appropriate for education. Discussed PCP husband reports her MD retired but will need a new one for follow up from here.Continue to work on discharge needs.

## 2020-09-10 NOTE — Patient Care Conference (Signed)
Inpatient RehabilitationTeam Conference and Plan of Care Update Date: 09/10/2020   Time: 9:51 AM    Patient Name: Darlene Marsh      Medical Record Number: [id number]  Date of Birth: [birth year] Sex: Female         Room/Bed: 0Q65H/8I69G-29 Payor Info: Payor: MEDICARE / Plan: MEDICARE PART A AND B / Product Type: *No Product type* /    Admit Date/Time:  09/07/2020  5:22 PM  Primary Diagnosis:  Brain tumor [facility])  Hospital Problems: Principal Problem:   Brain tumor [facility])    Expected Discharge Date: Expected Discharge Date: 09/24/20  Team Members Present: Physician leading conference: Dr. Leeroy Cha Care Coodinator Present: Erlene Quan, BSW;Eldo Umanzor Creig Hines, RN, BSN, [place] Nurse Present: Dorthula Nettles, RN PT Present: Becky Sax, PT OT Present: Simonne Come, OT SLP Present: Charolett Bumpers, SLP PPS Coordinator present : Gunnar Fusi, SLP     Current Status/Progress Goal Weekly Team Focus  Bowel/Bladder   Continent of B/B  with occasional incontinence LBM04/11  Pt will be continent of B/B with less episodes of incontinence  timed toileting   Swallow/Nutrition/ Hydration             ADL's   Mod assist transfers, Mod-max assist bathing and dressing due to L inattention and decreased motor planning, CGA to min assist for sitting balance due to L lean  Supervision overall  ADL retraining, sit > stand, dynamic standing balance, L attention, motor planning and sequencing, activity tolerance   Mobility   mod-min bed mobility and transfers, min A gait 67 ft HHA, min A 4 steps B rails, slow to initiate and poor motor planning, L lean in sitting.  supervision overall  functional mobility/transfers, generalized strengthening, dynamic standing balance/coordination, ambulation, NMR, and endurance.   Communication             Safety/Cognition/ Behavioral Observations  Mod A  Supervision  complex problem solving, recall, selective attention   Pain   c/o headache relief with  tylenol  pain <=2/10  assess pain qshift and prn medicate prn   Skin   surgical incison to head with sutures  skin remain intact with no infection  assess skin qshift and prn     Discharge Planning:  HOme with husband who is able to provide 24/7 assist. Both hopeful will do well here and progress so she can dance again   Team Discussion: Started Topamax for headaches, wound is healing well, po intake is poor, and patient has left side neglect. Incontinent bladder, still not sleeping well, crani to the head is clean with sutures. To discharge home with husband who will provide 24/7 care. Patient on target to meet rehab goals: yes, mod assist for transfers, walks 140 ft but has trouble with sequencing. She can't back up, have to pick up her foot to move it into position. She has poor motor planning. Able to walk to the shower, once in the shower she was mod/max assist. Max assist with bathing. Not able to follow through with the task. SLP she is mod assist with supervision goals. Working on complex problem solving, recall, and attention.  *See Care Plan and progress notes for long and short-term goals.   Revisions to Treatment Plan:  Topamax for vascular headache.  Teaching Needs: Family education, medication management, pain management, skin/wound care, transfer training, gait training, balance training, endurance training, safety awareness.  Current Barriers to Discharge: Decreased caregiver support, Medical stability, Home enviroment access/layout, Incontinence, Wound care, Lack of/limited family support, Medication  compliance and Behavior  Possible Resolutions to Barriers: Continue current medications, provide emotional support.     Medical Summary Current Status: wound healing well, seasonal allergies, vascular headace, poor po intake, left sided neglect  Barriers to Discharge: Medical stability;Wound care  Barriers to Discharge Comments: wound healing well, seasonal allergies,  vascular headace, poor po intake, left sided neglect Possible Resolutions to [employer] Focus: daily monitoring wound, start topamax for headaches, continue tylenol PRN, encourage foods she likes   Continued Need for Acute Rehabilitation Level of Care: The patient requires daily medical management by a physician with specialized training in physical medicine and rehabilitation for the following reasons: Direction of a multidisciplinary physical rehabilitation program to maximize functional independence : Yes Medical management of patient stability for increased activity during participation in an intensive rehabilitation regime.: Yes Analysis of laboratory values and/or radiology reports with any subsequent need for medication adjustment and/or medical intervention. : Yes   I attest that I was present, lead the team conference, and concur with the assessment and plan of the team.   Cristi Loron 09/10/2020, 2:12 PM

## 2020-09-10 NOTE — IPOC Note (Signed)
Overall Plan of Care [facility]) Patient Details Name: Darlene Marsh MRN: [id number] DOB: [birth year]  Admitting Diagnosis: Brain tumor [facility])  Hospital Problems: Principal Problem:   Brain tumor [facility])     Functional Problem List: Nursing Endurance,Medication Management,Motor,Nutrition,Pain,Safety,Skin Integrity,Bowel,Bladder  PT Balance,Pain,Behavior,Perception,Edema,Safety,Endurance,Sensory,Motor,Skin Integrity,Nutrition  OT Balance  SLP Cognition  TR         Basic ADL's: OT Grooming,Bathing,Dressing,Toileting     Advanced  ADL's: OT       Transfers: PT Bed Mobility,Bed to [place]  OT Toilet,Tub/Shower     Locomotion: PT Ambulation,Wheelchair Mobility,Stairs     Additional Impairments: OT Fuctional Use of Upper Extremity  SLP Social Cognition   Problem Solving,Memory,Attention,Awareness  TR      Anticipated Outcomes Item Anticipated Outcome  Self Feeding set up assist  Swallowing      Basic self-care  supervision  Toileting  supervision   Bathroom Transfers supervision  Bowel/Bladder  Pt will regain/maintain bowel and bladder continence during hospital stay.  Transfers  supervision using LRAD  Locomotion  supervision >150 ft using LRAD  Communication     Cognition  supervision  Pain  Pt will maintain low pain level during hospital stay.  Safety/Judgment  pt will maintain safety/judgement awareness during hospital stay.   Therapy Plan: PT Intensity: Minimum of 1-2 x/day ,45 to 90 minutes PT Frequency: 5 out of 7 days PT Duration Estimated Length of Stay: 14-16 days OT Intensity: Minimum of 1-2 x/day, 45 to 90 minutes OT Frequency: 5 out of 7 days OT Duration/Estimated Length of Stay: 2 weeks SLP Intensity: Minumum of 1-2 x/day, 30 to 90 minutes SLP Frequency: 3 to 5 out of 7 days SLP Duration/Estimated Length of Stay: 14 days   Due to the current state of emergency, patients may not be receiving their 3-hours of Medicare-mandated  therapy.   Team Interventions: Nursing Interventions Patient/Family Education,Bladder Management,Discharge Planning,Medication Management,Skin Care/Wound Management,Psychosocial Support,Bowel Management,Disease Management/Prevention  PT interventions Ambulation/gait training,Cognitive remediation/compensation,Discharge planning,DME/adaptive equipment instruction,Functional mobility training,Pain management,Psychosocial support,Splinting/orthotics,Therapeutic Activities,UE/LE Strength taining/ROM,Visual/perceptual remediation/compensation,Wheelchair propulsion/positioning,UE/LE Coordination activities,Therapeutic Exercise,Stair training,Skin care/wound management,Neuromuscular re-education,Patient/family education,Functional electrical stimulation,Disease Education officer, community  OT Interventions Balance/vestibular training,Patient/family education,Community reintegration,Neuromuscular re-education,Self Care/advanced ADL retraining,Therapeutic Exercise,UE/LE Coordination activities,Cognitive remediation/compensation,Discharge planning,DME/adaptive equipment instruction,Functional mobility training,Pain management,Therapeutic Activities,UE/LE Strength taining/ROM,Visual/perceptual remediation/compensation,Wheelchair propulsion/positioning  SLP Interventions Cognitive remediation/compensation,Cueing hierarchy,Environmental controls,Functional tasks,Patient/family education,Internal/external aids  TR Interventions    SW/CM Interventions Discharge Planning,Psychosocial Support,Patient/Family Education   Barriers to Discharge MD  Medical stability  Nursing Decreased caregiver support,Home environment access/layout,Wound Care,Lack of/limited family support,Medication compliance,Pending chemo/radiation,Behavior    PT Inaccessible home environment,Decreased caregiver support,Lack of/limited family support,Behavior Family can only provide supervision at d/c  OT       SLP      SW       Team Discharge Planning: Destination: PT-Home ,OT- Home , SLP-Home Projected Follow-up: PT-Outpatient PT, OT-  Home health OT, SLP-Home Health SLP,24 hour supervision/assistance Projected Equipment Needs: PT-To be determined, OT- To be determined, SLP-None recommended by SLP Equipment Details: PT- , OT-  Patient/family involved in discharge planning: PT- Patient,  OT- , SLP-Patient,Family member/caregiver  MD ELOS: 16 days Medical Rehab Prognosis:  Excellent Assessment: Darlene Marsh is a 71 year old woman who is admitted to CIR with a decline in ADLs secondary to a right frontal lobe tumor. She is having headache and topamax was started. Trazodone was started for her insomnia. Monitoring leukocytosis while steroids are being tapered. On Keppra for seizure associated with her frontal mass.    See Team Conference Notes for weekly updates  to the plan of care

## 2020-09-11 LAB — URINALYSIS, ROUTINE W REFLEX MICROSCOPIC
Bilirubin Urine: NEGATIVE
Glucose, UA: NEGATIVE mg/dL
Hgb urine dipstick: NEGATIVE
Ketones, ur: NEGATIVE mg/dL
Leukocytes,Ua: NEGATIVE
Nitrite: NEGATIVE
Protein, ur: NEGATIVE mg/dL
Specific Gravity, Urine: 1.01 (ref 1.005–1.030)
pH: 7 (ref 5.0–8.0)

## 2020-09-11 LAB — GLUCOSE, CAPILLARY
Glucose-Capillary: 107 mg/dL — ABNORMAL HIGH (ref 70–99)
Glucose-Capillary: 121 mg/dL — ABNORMAL HIGH (ref 70–99)
Glucose-Capillary: 135 mg/dL — ABNORMAL HIGH (ref 70–99)
Glucose-Capillary: 114 mg/dL — ABNORMAL HIGH (ref 70–99)

## 2020-09-11 NOTE — Progress Notes (Signed)
Physical Therapy Session Note  Patient Details  Name: Darlene Marsh MRN: [id number] Date of Birth: Dec 23, 1950  Today's Date: 09/11/2020 PT Individual Time: 1135-1200 PT Individual Time Calculation (min): 25 min   Short Term Goals: Week 1:  PT Short Term Goal 1 (Week 1): Patient will perform bed mobility with min a consitently. PT Short Term Goal 2 (Week 1): Patient will complete standardized balance assessment. PT Short Term Goal 3 (Week 1): Patient will perform transfers with min A consistently. PT Short Term Goal 4 (Week 1): Patient will ambulate >100 ft with CGA using LRAD.  Skilled Therapeutic Interventions/Progress Updates:    Patient received sitting up recliner, agreeable to PT. She denies pain. Patient requesting to remain in room due to fatigue. Static and dynamic sitting balance completed edge of recliner. Patient with poor ability to follow multi-step commands. Also, persistent L lateral lean with limited ability to self-identify posture and correct as appropriate, despite multi modal cues used to assist patient back to midline. Patient remaining up in recliner at end of session, chair alarm on, call light within reach.   Therapy Documentation Precautions:  Precautions Precautions: Fall Precaution Comments: s/p tumor resection, L lean Restrictions Weight Bearing Restrictions: No   Therapy/Group: Individual Therapy  Karoline Caldwell, PT, DPT, CBIS  09/11/2020, 7:52 AM

## 2020-09-11 NOTE — Progress Notes (Signed)
Occupational Therapy Session Note  Patient Details  Name: Darlene Marsh MRN: [id number] Date of Birth: 11-07-1950  Today's Date: 09/11/2020 OT Individual Time: 8921-1941 OT Individual Time Calculation (min): 30 min    Short Term Goals: Week 1:  OT Short Term Goal 1 (Week 1): Pt will don shirt with min A OT Short Term Goal 2 (Week 1): Pt will complete toilet transfer with CGA OT Short Term Goal 3 (Week 1): Pt will require no more than min cueing for attention to L arm during bathing  Skilled Therapeutic Interventions/Progress Updates:    Treatment session with focus on attention to midline and L visual field.  Pt received seated in recliner, leaning L.  Pt required min assist to correct sitting balance and positioned more upright to engage in seated task.  Therapist introduced visual scanning with pattern replication task.  Utilized 2 colors and provided with ABAB pattern.  Pt beginning in R visual field each time and scanning R to L despite increased cues to attempt scanning L to R.  Pt able to replicate one pattern with min-mod cues, however required max multimodal cues for replication during second pattern with decreased scanning to L.  Therapist wrote in pt memory book to increase recall of therapy sessions.  Pt returned to semi-reclined in recliner with legs elevated and all needs in reach.  Therapy Documentation Precautions:  Precautions Precautions: Fall Precaution Comments: s/p tumor resection, L lean Restrictions Weight Bearing Restrictions: No General:   Vital Signs: Therapy Vitals Temp: 98.7 F (37.1 C) Temp Source: Oral Pulse Rate: (!) 59 Resp: 16 BP: (!) 88/53 Patient Position (if appropriate): Lying Oxygen Therapy SpO2: 96 % O2 Device: Room Air Pain:  Pt with no c/o pain   Therapy/Group: Individual Therapy  Simonne Come 09/11/2020, 6:57 AM

## 2020-09-11 NOTE — Progress Notes (Signed)
Speech Language Pathology Daily Session Note  Patient Details  Name: Aliveah Gallant MRN: [id number] Date of Birth: [birth year]  Today's Date: 09/11/2020 SLP Individual Time: 1435-1500 SLP Individual Time Calculation (min): 25 min  Short Term Goals: Week 1: SLP Short Term Goal 1 (Week 1): Pt will recall daily information with min assist cues for use of memory compensatory strategies. SLP Short Term Goal 2 (Week 1): Pt will selectively attend to tasks for 5-7 minute intervals with min cues for redirection. SLP Short Term Goal 3 (Week 1): Pt will complete mildly complex tasks with min verbal cues for functional problem solving.  Skilled Therapeutic Interventions: Skilled treatment session focused on cognitive goals. SLP facilitated session by providing extra time and overall Mod A verbal and visual cues for left visual scanning, selective attention and initiation during a mildly complex task. The patient's door was open with moderate distractions in the hall. Patient demonstrated appropriate emergent awareness regarding distractibility. Patient left upright in recliner with alarm on and all needs within reach. Continue with current plan of care.      Pain Pain Assessment Pain Scale: Faces Pain Score: 0-No pain  Therapy/Group: Individual Therapy  Ketzia Guzek 09/11/2020, 4:24 PM

## 2020-09-11 NOTE — Progress Notes (Signed)
Occupational Therapy Session Note  Patient Details  Name: Darlene Marsh MRN: [id number] Date of Birth: Jul 22, 1950  Today's Date: 09/11/2020 OT Individual Time: 1303-1400 OT Individual Time Calculation (min): 57 min    Short Term Goals: Week 1:  OT Short Term Goal 1 (Week 1): Pt will don shirt with min A OT Short Term Goal 2 (Week 1): Pt will complete toilet transfer with CGA OT Short Term Goal 3 (Week 1): Pt will require no more than min cueing for attention to L arm during bathing  Skilled Therapeutic Interventions/Progress Updates:    Pt in bedside recliner to start session.  She was able to recall reason for being in the hospital as well as what she had completed in previous sessions earlier today.  She was able to transfer to the wheelchair with min assist stand pivot.  Took her down to the ortho gym for session with work on LUE coordination as well as visual scanning in sitting and standing.  She was able to complete the BITS program for intervals of 1-2 mins using Visual Scanning program.  With use of the LUE she was able to complete interval in sitting for 4.4 seconds between dots with 84% accuracy and min instructional cueing to look left of midline to locate the blue dot.  She then completed two intervals with use of the LUE at an average of 5.0 seconds with 71% accuracy.  In standing, she completed 1 interval as well of 2 mins using the LUE.  Min assist for standing balance with use of the RW for support with an average of 5.8 seconds per dot with only 66/% accuracy.  Next, had her transfer to the therapy mat with min assist to complete Nine Hole Peg Test.  She was able to complete it with the RUE in 48 seconds and then 76 seconds with the left.  Returned to the wheelchair at min assist with return to the room and pt transitioning to the recliner at the same level.  Pt with max instructional cueing needed throughout session for hand positioning during sit to stand as well as stepping all the  way back to the surface to sit with the LLE.  Decreased sustained attention noted throughout activities as well as she would need cueing to continue looking for dots on the BITS or when placing pegs, as she would stop when it wasn't in midline or left periphery.  Therapist completed memory notebook and left pt with the call button and phone in reach and safety alarm in place.    Therapy Documentation Precautions:  Precautions Precautions: Fall Precaution Comments: s/p tumor resection, L lean Restrictions Weight Bearing Restrictions: No  Pain: Pain Assessment Pain Scale: Faces Pain Score: 0-No pain ADL: See Care Tool Section for some details of mobility and selfcare  Therapy/Group: Individual Therapy  Quantae Martel OTR/L 09/11/2020, 3:32 PM

## 2020-09-11 NOTE — Progress Notes (Signed)
Physical Therapy Session Note  Patient Details  Name: Darlene Marsh MRN: [id number] Date of Birth: 25-Aug-1950  Today's Date: 09/11/2020 PT Individual Time: 0801-0856 PT Individual Time Calculation (min): 55 min   Short Term Goals: Week 1:  PT Short Term Goal 1 (Week 1): Patient will perform bed mobility with min a consitently. PT Short Term Goal 2 (Week 1): Patient will complete standardized balance assessment. PT Short Term Goal 3 (Week 1): Patient will perform transfers with min A consistently. PT Short Term Goal 4 (Week 1): Patient will ambulate >100 ft with CGA using LRAD.  Skilled Therapeutic Interventions/Progress Updates:   Received pt supine in bed with RN present at bedside reporting low BP this morning: 96/58. Therapist donned ted hose with total A and rechecked BP: 97/56 in supine and pt asymptomatic. Pt agreeable to therapy and denied any pain during session. Session with focus on dressing, initiation, sequencing, motor planning, L attention, functional mobility/transfers, generalized strengthening, dynamic standing balance/coordination, toileting, and improved activity tolerance.  Pt rolled L and R with min A and use of bedrails and doffed dirty pants with max A (pt able to bridge to assist) and checked to ensure brief was dry. Pt found to be clean and donned clean pants in supine in same manner with max A. Pt transferred supine<>R sidelying<>sitting EOB with mod A and cues to attend to LLE. Donned shoes sitting EOB with total A and noted pt with less L lateral lean today. Doffed dirty shirt with min A and increased time and donned clean pull over shirt and sweatshirt with mod A with max cues to attend to LUE. Sit<>stand with RW and mod A to pull pants over hips with total A (posterior LOB) and stand<>pivot bed<>WC with RW and mod A with max cues for sequencing when turning. Pt sat in WC at sink and brushed teeth with with emphasis on L visual scanning to locate items and functional use of  LUE. Pt then reported sudden urge to urinate. Due to urgency, used [place] and transferred sit<>stand in [place] with mod A and transferred to toilet with bedside commode over top dependently. Semi-sit<>stand with min A and total A for clothing management. Pt required max A to scoot hips back on commode. Pt with soiled brief and able to void and with small BM. Required total A for peri-care in standing and max A to pull pants over hips. Pt ambulated 31ft with RW and min A to recliner with same cues mentioned above for sequencing when turning. Concluded session with pt sitting in recliner, needs within reach, and chair pad alarm on. Memory notebook updated.   Therapy Documentation Precautions:  Precautions Precautions: Fall Precaution Comments: s/p tumor resection, L lean Restrictions Weight Bearing Restrictions: No  Therapy/Group: Individual Therapy Alfonse Alpers PT, DPT   09/11/2020, 7:27 AM

## 2020-09-12 DIAGNOSIS — D496 Neoplasm of unspecified behavior of brain: Secondary | ICD-10-CM | POA: Diagnosis not present

## 2020-09-12 LAB — GLUCOSE, CAPILLARY
Glucose-Capillary: 102 mg/dL — ABNORMAL HIGH (ref 70–99)
Glucose-Capillary: 108 mg/dL — ABNORMAL HIGH (ref 70–99)
Glucose-Capillary: 102 mg/dL — ABNORMAL HIGH (ref 70–99)
Glucose-Capillary: 109 mg/dL — ABNORMAL HIGH (ref 70–99)

## 2020-09-12 LAB — URINE CULTURE: Culture: 40000 — AB

## 2020-09-12 MED ORDER — SODIUM CHLORIDE 0.9 % IV BOLUS
500.0000 mL | Freq: Once | INTRAVENOUS | Status: AC
  Administered 2020-09-12: 500 mL via INTRAVENOUS

## 2020-09-12 MED ORDER — SODIUM CHLORIDE 0.9 % IV BOLUS: 250.0000 mL | Freq: Once | INTRAVENOUS | Status: DC

## 2020-09-12 MED ORDER — TOPIRAMATE 25 MG PO TABS
50.0000 mg | ORAL_TABLET | Freq: Every day | ORAL | Status: DC
  Administered 2020-09-12 – 2020-09-13 (×2): 50 mg via ORAL
  Filled 2020-09-12 (×2): qty 2

## 2020-09-12 MED ORDER — LISINOPRIL 5 MG PO TABS: 2.5000 mg | ORAL_TABLET | Freq: Every day | ORAL | Status: DC

## 2020-09-12 MED ORDER — ACETAMINOPHEN-CODEINE #3 300-30 MG PO TABS
1.0000 | ORAL_TABLET | ORAL | Status: DC | PRN
  Administered 2020-09-16 (×2): 1 via ORAL
  Filled 2020-09-12 (×3): qty 1

## 2020-09-12 NOTE — Progress Notes (Signed)
Speech Language Pathology Daily Session Note  Patient Details  Name: Darlene Marsh MRN: [id number] Date of Birth: 1950/09/27  Today's Date: 09/12/2020 SLP Individual Time: 1305-1400 SLP Individual Time Calculation (min): 55 min  Short Term Goals: Week 1: SLP Short Term Goal 1 (Week 1): Pt will recall daily information with min assist cues for use of memory compensatory strategies. SLP Short Term Goal 2 (Week 1): Pt will selectively attend to tasks for 5-7 minute intervals with min cues for redirection. SLP Short Term Goal 3 (Week 1): Pt will complete mildly complex tasks with min verbal cues for functional problem solving.  Skilled Therapeutic Interventions: Skilled treatment session focused on cognitive goals. Upon arrival, patient was supine in bed with BP: 92/54. PA made aware and requested orthostatic vitals. BP in supine: 100/59, sitting EOB: 92/64, standing: 81/70. RN aware and present (of note, patient was asymptomatic). Therefore, therapy was conducted with patient upright in bed. SLP facilitated session by providing Min A verbal cues for sustained attention during a basic calendar making task. However, patient was overall Mod I for left visual scanning and had was able to use her LUE functionally without any assistance throughout task.  Patient utilized both upper extremities to hold her memory notebook and record events from morning therapy tasks in which patient required supervision level verbal cues to recall. Patient's writing was much more legible compared to earlier this week and her words were appropriately placed on the left side of the page and stayed within the lines. Patient left upright in bed with alarm on and all needs within reach. Continue with current plan of care.      Pain No/Denies Pain   Therapy/Group: Individual Therapy  Christina Gintz 09/12/2020, 2:25 PM

## 2020-09-12 NOTE — Progress Notes (Signed)
PROGRESS NOTE   Subjective/Complaints: Patient's chart reviewed- No issues reported overnight Vitals signs stable except for bradycardia- decrease lisinopril to 2.5mg  as BP has been low levels of normal Headache has improved but still present at times.   ROS- neg CP, SOB, N/V?D, +insomnia, +headache  Objective:   No results found. Recent Labs    09/09/20 1036  WBC 12.4*  HGB 14.8  HCT 41.5  PLT 340   Recent Labs    09/09/20 1036  NA 135  K 3.9  CL 100  CO2 27  GLUCOSE 138*  BUN 18  CREATININE 0.68  CALCIUM 9.1    Intake/Output Summary (Last 24 hours) at 09/12/2020 0829 Last data filed at 09/12/2020 0743 Gross per 24 hour  Intake 360 ml  Output --  Net 360 ml        Physical Exam: Vital Signs Blood pressure 103/63, pulse (!) 59, temperature 98.5 F (36.9 C), resp. rate 18, height 5\' 6"  (1.676 m), weight 57.7 kg, SpO2 98 %. Gen: no distress, normal appearing HEENT: oral mucosa pink and moist, NCAT Cardio: Reg rate Chest: normal effort, normal rate of breathing Abd: soft, non-distended Ext: no edema Psych: pleasant, normal affect Skin: intact Psych: pleasant, normal affect Skin: No evidence of breakdown, no evidence of rash Motor 4/5 LUE and LLE, 5/5 in RUE and RLE  Musculoskeletal/Neuro: Full range of motion in all 4 extremities. No joint swelling, left sided neglect  Assessment/Plan: 1. Functional deficits which require 3+ hours per day of interdisciplinary therapy in a comprehensive inpatient rehab setting.  Physiatrist is providing close team supervision and 24 hour management of active medical problems listed below.  Physiatrist and rehab team continue to assess barriers to discharge/monitor patient progress toward functional and medical goals  Care Tool:  Bathing    Body parts bathed by patient: Chest,Abdomen,Right upper leg,Left upper leg,Face   Body parts bathed by helper: Right  arm,Front perineal area,Buttocks,Left arm,Right lower leg,Left lower leg     Bathing assist Assist Level: Maximal Assistance - Patient 24 - 49%     Upper Body Dressing/Undressing Upper body dressing   What is the patient wearing?: Pull over shirt    Upper body assist Assist Level: Total Assistance - Patient < 25%    Lower Body Dressing/Undressing Lower body dressing      What is the patient wearing?: Pants,Incontinence brief     Lower body assist Assist for lower body dressing: Total Assistance - Patient < 25%     Toileting Toileting    Toileting assist Assist for toileting: Total Assistance - Patient < 25%     Transfers Chair/bed transfer  Transfers assist  Chair/bed transfer activity did not occur: Safety/medical concerns  Chair/bed transfer assist level: Minimal Assistance - Patient > 75%     Locomotion Ambulation   Ambulation assist      Assist level: Minimal Assistance - Patient > 75% Assistive device: Walker-rolling Max distance: 119ft   Walk 10 feet activity   Assist     Assist level: Minimal Assistance - Patient > 75% Assistive device: Walker-rolling   Walk 50 feet activity   Assist    Assist level: Minimal Assistance -  Patient > 75% Assistive device: Walker-rolling    Walk 150 feet activity   Assist Walk 150 feet activity did not occur: Safety/medical concerns (decreased strength/activity tolerance)         Walk 10 feet on uneven surface  activity   Assist Walk 10 feet on uneven surfaces activity did not occur: Safety/medical concerns (decreased strength/activity tolerance)         Wheelchair     Assist Will patient use wheelchair at discharge?: No (Per PT long term goals) Type of Wheelchair: Manual    Wheelchair assist level: Total Assistance - Patient < 25% Max wheelchair distance: 2 ft without assist, 148 ft dependent    Wheelchair 50 feet with 2 turns activity    Assist        Assist Level:  Dependent - Patient 0%,Total Assistance - Patient < 25%   Wheelchair 150 feet activity     Assist      Assist Level: Total Assistance - Patient < 25%   Blood pressure 103/63, pulse (!) 59, temperature 98.5 F (36.9 C), resp. rate 18, height 5\' 6"  (1.676 m), weight 57.7 kg, SpO2 98 %.  Medical Problem List and Plan: 1.  Decline in ADLs self care and cognition secondary to Right frontal lobe tumor             -patient may  Shower with shower cap             -ELOS/Goals: 10-12d  Continue CIR 2.  Impaired mobility: -DVT/anticoagulation:  Mechanical: Continue Sequential compression devices, below knee Bilateral lower extremities             -antiplatelet therapy: none 3. Headache: Decrease tylenol with codeine to 1 tab q4H prn. Increase topamax to 50mg  HS for headache and insomnia 4. Mood: monitor , may need neuropsych eval             -antipsychotic agents: none 5. Neuropsych: This patient is not capable of making decisions on her own behalf. 6. Skin/Wound Care: Monitor incision frontal crani, no dressing needed 7. Fluids/Electrolytes/Nutrition: SLP eval, monitor fluid and caloric intake, poor appetite thus far may need IVF 8.  Cerebral edema, decadron taper 9.  Hx HTN- Continue lisinopril Vitals:   09/11/20 2018 09/12/20 0452  BP: 97/61 103/63  Pulse: 60 (!) 59  Resp: 16 18  Temp: 98.6 F (37 C) 98.5 F (36.9 C)  SpO2: 97% 98%  10.  Leukocytosis without fever or other symptoms of infx, likely decadron, will monitor during taper  11.  Hyperglycemia due to steroids, initiate SSI , CBG tid 12.  Seizure d/o associated with frontal mass- cont Keppra 500mg  BID - no driving x 6 mo per [place] law will need to be cleared by neurosurg or neurology  At that time  13. Insomnia: increase trazodone to 75mg . Edited therapy order to request patient be taken outside during the day if possible 14. Urinary incontinence: UA negative. Will place order for timed voiding q2H during the day.  15.  Bradycardia: decrease lisinopril to 2.5mg   LOS: 5 days A FACE TO FACE EVALUATION WAS PERFORMED  Martha Clan P Alonia Dibuono 09/12/2020, 8:29 AM

## 2020-09-12 NOTE — Progress Notes (Signed)
Physical Therapy Session Note  Patient Details  Name: Darlene Marsh MRN: [id number] Date of Birth: September 28, 1950  Today's Date: 09/12/2020 PT Individual Time: 3810-1751 PT Individual Time Calculation (min): 54 min   Short Term Goals: Week 1:  PT Short Term Goal 1 (Week 1): Patient will perform bed mobility with min a consitently. PT Short Term Goal 2 (Week 1): Patient will complete standardized balance assessment. PT Short Term Goal 3 (Week 1): Patient will perform transfers with min A consistently. PT Short Term Goal 4 (Week 1): Patient will ambulate >100 ft with CGA using LRAD.  Skilled Therapeutic Interventions/Progress Updates:   Received pt supine in bed, pt agreeable to therapy, and denied any pain during session. NT reporting pt with low BP and plan to receive IV fluids this afternoon. Session with focus on functional mobility/transfers, generalized strengthening, dynamic standing balance/coordination, L attention, initiation, NMR, gait training, and improved activity tolerance. BP in supine: 94/53 and pt asymptomatic. Donned ted hose with max A and transferred supine<>sitting EOB with light min A for LLE management and stand<>pivot bed<>WC with RW and min A with poor eccentric control when sitting. Pt transported to 4W dayroom in [facility] total A for time management purposes. Pt ambulated 153ft with RW and min A. Pt demonstrated narrow BOS, decreased stride length, and easily distracted by people in hallway. Worked on dynamic standing balance tapping LEs to 3 colored cones based off therapist's cues with BUE support on RW and min A for balance. Trial 1: 8/10 and Trial 2: 7/10 accuracy.  Pt with 2 posterior LOB requiring min A and 1 complete posterior LOB into WC. Participated in obstacle course weaving in/out of cones 2x45ft with min A. Pt frequently letting go of RW with LUE and stepping outside BOS of RW requiring max cues to maintain L handgrip on RW and to keep RW within BOS. Pt reported feeling  fatigued and stated that her "legs felt weak" with ambulation. Of note, pt required mod/max cues throughout session for hand placement on WC/RW when sitting/standing throughout session. Pt transported back to room in [facility] total A and ambulated 47ft without AD and handheld assist to bed. Doffed shoes sitting EOB with supervision and increased time and transferred sit<>supine with CGA and cues for technique. Concluded session with pt supine in bed, needs within reach, and bed alarm on.   Therapy Documentation Precautions:  Precautions Precautions: Fall Precaution Comments: s/p tumor resection, L lean Restrictions Weight Bearing Restrictions: Yes  Therapy/Group: Individual Therapy Alfonse Alpers PT, DPT   09/12/2020, 7:34 AM

## 2020-09-12 NOTE — Progress Notes (Signed)
Occupational Therapy Session Note  Patient Details  Name: Darlene Marsh MRN: [id number] Date of Birth: 03-17-1951  Today's Date: 09/12/2020 OT Individual Time: 1504-1364 OT Individual Time Calculation (min): 74 min    Short Term Goals: Week 1:  OT Short Term Goal 1 (Week 1): Pt will don shirt with min A OT Short Term Goal 2 (Week 1): Pt will complete toilet transfer with CGA OT Short Term Goal 3 (Week 1): Pt will require no more than min cueing for attention to L arm during bathing  Skilled Therapeutic Interventions/Progress Updates:     Pt received in bed with no pain agreeable to shower. Careful control of water during back half of head washing to keep incision clean and dry.  ADL:  Pt completes bathing with MIN A sit to stand with grab bar for buttock hygiene and A to wash B feet.  Pt completes UB dressing with S for pull over sweater and MIN A to pull jacket around back. Pt with poor dressing praxis with jacket looking at OT for A with orientation and max question cues to problem solve pulling jacket around back Pt completes LB dressing with MIN A sit to stand from TTB in shower and increased time to follow cues to cross LEs to thread BLE with CGA for sitting balance Pt completes footwear with VC for crossing Les to don non skid socks Pt completes toileting with MIN A for stnading balance during clothing management for bladder void Pt completes toileting transfer with min A via hand held ambulation into bathroom with VC for imrpoved weight shifting R and wider stance during sit to stands Pt completes shower/Tub transfer with same A as above Pt left at end of session in recliner with exit alarm on, call light in reach and all needs met   Therapy Documentation Precautions:  Precautions Precautions: Fall Precaution Comments: s/p tumor resection, L lean Restrictions Weight Bearing Restrictions: Yes General:   Vital Signs: Therapy Vitals Temp: 98.5 F (36.9 C) Pulse Rate: (!)  59 Resp: 18 BP: 103/63 Patient Position (if appropriate): Lying Oxygen Therapy SpO2: 98 % O2 Device: Room Air Pain:   ADL: ADL Eating: Set up,Supervision/safety Where Assessed-Eating: Bed level Grooming: Minimal assistance Where Assessed-Grooming: Edge of bed Upper Body Bathing: Moderate assistance,Moderate cueing Where Assessed-Upper Body Bathing: Edge of bed Lower Body Bathing: Moderate assistance,Moderate cueing Where Assessed-Lower Body Bathing: Standing at sink,Sitting at sink Upper Body Dressing: Moderate cueing,Moderate assistance Where Assessed-Upper Body Dressing: Sitting at sink,Standing at sink Lower Body Dressing: Moderate cueing,Moderate assistance Where Assessed-Lower Body Dressing: Edge of bed Toileting: Moderate cueing,Moderate assistance Where Assessed-Toileting: Glass blower/designer: Moderate assistance,Moderate verbal cueing Toilet Transfer Method: Ambulating Tub/Shower Transfer: Unable to assess Vision   Perception    Praxis   Exercises:   Other Treatments:     Therapy/Group: Individual Therapy  Tonny Branch 09/12/2020, 6:50 AM

## 2020-09-12 NOTE — Progress Notes (Signed)
BP med held this am but still low--may be trending down as steroids being weaned off.  Will d/c lisinopril and bolus with 250 cc.

## 2020-09-13 DIAGNOSIS — D496 Neoplasm of unspecified behavior of brain: Secondary | ICD-10-CM | POA: Diagnosis not present

## 2020-09-13 LAB — GLUCOSE, CAPILLARY
Glucose-Capillary: 102 mg/dL — ABNORMAL HIGH (ref 70–99)
Glucose-Capillary: 93 mg/dL (ref 70–99)
Glucose-Capillary: 119 mg/dL — ABNORMAL HIGH (ref 70–99)

## 2020-09-13 MED ORDER — TRAZODONE HCL 100 MG PO TABS
100.0000 mg | ORAL_TABLET | Freq: Every evening | ORAL | Status: DC | PRN
  Administered 2020-09-13 – 2020-09-14 (×2): 100 mg via ORAL
  Filled 2020-09-13 (×2): qty 1

## 2020-09-13 NOTE — Progress Notes (Signed)
Physical Therapy Session Note  Patient Details  Name: Darlene Marsh MRN: [id number] Date of Birth: 1950/10/16  Today's Date: 09/13/2020 PT Individual Time: 1300-1340 PT Individual Time Calculation (min): 40 min   Short Term Goals: Week 1:  PT Short Term Goal 1 (Week 1): Patient will perform bed mobility with min a consitently. PT Short Term Goal 2 (Week 1): Patient will complete standardized balance assessment. PT Short Term Goal 3 (Week 1): Patient will perform transfers with min A consistently. PT Short Term Goal 4 (Week 1): Patient will ambulate >100 ft with CGA using LRAD.  Skilled Therapeutic Interventions/Progress Updates:   Received pt sitting in recliner, pt agreeable to therapy, and denied any pain during session but reported having a "rough day" and feeling exhausted from not sleeping well. Session with focus on functional mobility/transfers, generalized strengthening, dynamic standing balance/coordination, gait training, stair navigation, and improved activity tolerance. Donned shoes with max A and transferred recliner<>WC stand<>pivot with RW and min A with cues for hand placement on RW when standing and to back up completely to [facility] prior to sitting. Pt transported to 4W therapy gym in [facility] total A for time management purposes and navigated 4 steps with 2 rails and min A overall ascending with a step through and descending with a step to pattern. Pt with mild posterior lean when descending requiring cues for anterior weight shifting. Pt then ambulated 14ft x 2 trials with RW and min A overall. Pt demonstrated improvements with ability to maintain LUE grip on RW when turning today compared to yesterday. Pt stated "my knees feel so weak" multiple times while ambulating but no LOB or buckling noted. Pt demonstrates narrow BOS, decreased bilateral foot clearance, and is easily distracted by other people in gym. Pt transported back to room in [facility] total A and ambulated 56ft with min handheld assist  back to bed and doffed shoes sitting EOB with supervision. Sit<>supine with supervision and cues for midline positioning in bed. Concluded session with pt supine in bed, needs within reach, and bed alarm on.   Therapy Documentation Precautions:  Precautions Precautions: Fall Precaution Comments: s/p tumor resection, L lean Restrictions Weight Bearing Restrictions: No  Therapy/Group: Individual Therapy Alfonse Alpers PT, DPT   09/13/2020, 7:31 AM

## 2020-09-13 NOTE — Progress Notes (Signed)
PROGRESS NOTE   Subjective/Complaints: Headache has improved. She is still not sleeping well. Would like to try an increased dose of trazodone Vitals are stable  ROS- neg CP, SOB, N/V?D, +insomnia, headache has improved  Objective:   No results found. No results for input(s): WBC, HGB, HCT, PLT in the last 72 hours. No results for input(s): NA, K, CL, CO2, GLUCOSE, BUN, CREATININE, CALCIUM in the last 72 hours.  Intake/Output Summary (Last 24 hours) at 09/13/2020 1145 Last data filed at 09/13/2020 0700 Gross per 24 hour  Intake 660 ml  Output --  Net 660 ml        Physical Exam: Vital Signs Blood pressure 105/67, pulse 62, temperature 98.4 F (36.9 C), temperature source Oral, resp. rate 16, height 5\' 6"  (1.676 m), weight 57.7 kg, SpO2 99 %. Gen: no distress, normal appearing HEENT: oral mucosa pink and moist, NCAT Cardio: Reg rate Chest: normal effort, normal rate of breathing Abd: soft, non-distended Ext: no edema Psych: pleasant, normal affect Skin: No evidence of breakdown, no evidence of rash Motor 4/5 LUE and LLE, 5/5 in RUE and RLE  Musculoskeletal/Neuro: Full range of motion in all 4 extremities. No joint swelling, left sided neglect  Assessment/Plan: 1. Functional deficits which require 3+ hours per day of interdisciplinary therapy in a comprehensive inpatient rehab setting.  Physiatrist is providing close team supervision and 24 hour management of active medical problems listed below.  Physiatrist and rehab team continue to assess barriers to discharge/monitor patient progress toward functional and medical goals  Care Tool:  Bathing    Body parts bathed by patient: Chest,Abdomen,Right upper leg,Left upper leg,Face   Body parts bathed by helper: Right arm,Front perineal area,Buttocks,Left arm,Right lower leg,Left lower leg     Bathing assist Assist Level: Maximal Assistance - Patient 24 - 49%      Upper Body Dressing/Undressing Upper body dressing   What is the patient wearing?: Pull over shirt    Upper body assist Assist Level: Maximal Assistance - Patient 25 - 49%    Lower Body Dressing/Undressing Lower body dressing      What is the patient wearing?: Pants,Incontinence brief     Lower body assist Assist for lower body dressing: Maximal Assistance - Patient 25 - 49%     Toileting Toileting    Toileting assist Assist for toileting: Total Assistance - Patient < 25%     Transfers Chair/bed transfer  Transfers assist  Chair/bed transfer activity did not occur: Safety/medical concerns  Chair/bed transfer assist level: Minimal Assistance - Patient > 75%     Locomotion Ambulation   Ambulation assist      Assist level: Minimal Assistance - Patient > 75% Assistive device: Walker-rolling Max distance: 183ft   Walk 10 feet activity   Assist     Assist level: Minimal Assistance - Patient > 75% Assistive device: Walker-rolling   Walk 50 feet activity   Assist    Assist level: Minimal Assistance - Patient > 75% Assistive device: Walker-rolling    Walk 150 feet activity   Assist Walk 150 feet activity did not occur: Safety/medical concerns (decreased strength/activity tolerance)  Assist level: Minimal Assistance - Patient > 75%  Assistive device: Walker-rolling    Walk 10 feet on uneven surface  activity   Assist Walk 10 feet on uneven surfaces activity did not occur: Safety/medical concerns (decreased strength/activity tolerance)         Wheelchair     Assist Will patient use wheelchair at discharge?: No (Per PT long term goals) Type of Wheelchair: Manual    Wheelchair assist level: Total Assistance - Patient < 25% Max wheelchair distance: 2 ft without assist, 148 ft dependent    Wheelchair 50 feet with 2 turns activity    Assist        Assist Level: Dependent - Patient 0%,Total Assistance - Patient < 25%    Wheelchair 150 feet activity     Assist      Assist Level: Total Assistance - Patient < 25%   Blood pressure 105/67, pulse 62, temperature 98.4 F (36.9 C), temperature source Oral, resp. rate 16, height 5\' 6"  (1.676 m), weight 57.7 kg, SpO2 99 %.  Medical Problem List and Plan: 1.  Decline in ADLs self care and cognition secondary to Right frontal lobe tumor             -patient may  Shower with shower cap             -ELOS/Goals: 10-12d  Continue CIR 2.  Impaired mobility: -DVT/anticoagulation:  Mechanical: Continue Sequential compression devices, below knee Bilateral lower extremities             -antiplatelet therapy: none 3. Headache: Decrease tylenol with codeine to 1 tab q4H prn. Continue topamax 50mg  HS for headache and insomnia 4. Mood: monitor , may need neuropsych eval             -antipsychotic agents: none 5. Neuropsych: This patient is not capable of making decisions on her own behalf. 6. Skin/Wound Care: Monitor incision frontal crani, no dressing needed 7. Fluids/Electrolytes/Nutrition: SLP eval, monitor fluid and caloric intake, poor appetite thus far may need IVF 8.  Cerebral edema, decadron taper 9.  Hx HTN- d/c lisinopril Vitals:   09/12/20 2009 09/13/20 0620  BP: (!) 103/59 105/67  Pulse: 63 62  Resp: 16   Temp: 98.4 F (36.9 C) 98.4 F (36.9 C)  SpO2: 98% 99%  10.  Leukocytosis without fever or other symptoms of infx, likely decadron, will monitor during taper  11.  Hyperglycemia due to steroids, initiate SSI , CBG tid 12.  Seizure d/o associated with frontal mass- cont Keppra 500mg  BID - no driving x 6 mo per [place] law will need to be cleared by neurosurg or neurology  At that time  13. Insomnia: increase trazodone to 100mg . Edited therapy order to request patient be taken outside during the day if possible 14. Urinary incontinence: UA negative. Will place order for timed voiding q2H during the day.  15. Bradycardia: d/c lisinopril  LOS: 6  days A FACE TO FACE EVALUATION WAS PERFORMED  Kjuan Seipp P Xela Oregel 09/13/2020, 11:45 AM

## 2020-09-13 NOTE — Progress Notes (Signed)
Occupational Therapy Session Note  Patient Details  Name: Darlene Marsh MRN: [id number] Date of Birth: [birth year]  Today's Date: 09/13/2020 OT Individual Time: 0700-0727 OT Individual Time Calculation (min): 27 min     Today's Date: 09/13/2020 OT Group Time: 1100-1200 OT Group Time Calculation (min): 60 min    Short Term Goals: Week 1:  OT Short Term Goal 1 (Week 1): Pt will don shirt with min A OT Short Term Goal 2 (Week 1): Pt will complete toilet transfer with CGA OT Short Term Goal 3 (Week 1): Pt will require no more than min cueing for attention to L arm during bathing  Skilled Therapeutic Interventions/Progress Updates:    1:1. Pt received in bed reporting no pain and agreeable to get dressed. OT dons teds and assesses BP after S level Sup HOB elevated to sit and VC for scoot to EOB. Pt BP at EOB 108/78. Pt dons shirt with S and Vc for weight shifting R for midline orientation. Pt dons pants with CGA for leaning forward on EOB to thread pants with mild L lean. Pt recalls threading LLE first. Pt with mod A sit to stand d/t posterior bias at EOB with no AD. Pt able to correct with "nose over toes" cue during subsequent standing with MIN A. Pt requests to change pants after advancing past hips d/t tightness. Total A for time management. Lateral stepping R to [facility] and sup>sit with bed rails and MIN A. Exited session with pt seated in bed, exit alarm on and call light in reach    Session 2: Pt participated in rhythmic drumming group. Pain intermittently in L shoulder Focus of group on BUE coordination, strengthening, endurance, timing/control, activity tolerance, and social participation and engagement. Pt performs session from seated position for energy conservation. Skilled interventions included grading movements through pain free ROM and self initiate rest breaks. Pt very concerned with getting timing perfect and OT educates on doing best and slowing rhythm down if too fast Warm up  performed prior to exercises and UB stretching completed at end of group with demo from OT. Pt able to select preferred song to share with group. Returned pt to room at end of session. Exited session with pt seated in recliner, exit alarm on and call light in reach  Therapy Documentation Precautions:  Precautions Precautions: Fall Precaution Comments: s/p tumor resection, L lean Restrictions Weight Bearing Restrictions: No General:   Vital Signs: Therapy Vitals Temp: 98.4 F (36.9 C) Temp Source: Oral Pulse Rate: 62 BP: 105/67 Patient Position (if appropriate): Lying Oxygen Therapy SpO2: 99 % O2 Device: Room Air Pain:   ADL: ADL Eating: Set up,Supervision/safety Where Assessed-Eating: Bed level Grooming: Minimal assistance Where Assessed-Grooming: Edge of bed Upper Body Bathing: Moderate assistance,Moderate cueing Where Assessed-Upper Body Bathing: Edge of bed Lower Body Bathing: Moderate assistance,Moderate cueing Where Assessed-Lower Body Bathing: Standing at sink,Sitting at sink Upper Body Dressing: Moderate cueing,Moderate assistance Where Assessed-Upper Body Dressing: Sitting at sink,Standing at sink Lower Body Dressing: Moderate cueing,Moderate assistance Where Assessed-Lower Body Dressing: Edge of bed Toileting: Moderate cueing,Moderate assistance Where Assessed-Toileting: Glass blower/designer: Moderate assistance,Moderate verbal cueing Toilet Transfer Method: Ambulating Tub/Shower Transfer: Unable to assess Vision   Perception    Praxis   Exercises:   Other Treatments:     Therapy/Group: Individual Therapy and Group Therapy  Tonny Branch 09/13/2020, 6:42 AM

## 2020-09-13 NOTE — Progress Notes (Signed)
Speech Language Pathology Daily Session Note  Patient Details  Name: Darlene Marsh MRN: [id number] Date of Birth: 1950/09/20  Today's Date: 09/13/2020 SLP Individual Time: 1848-5927 SLP Individual Time Calculation (min): 40 min  Short Term Goals: Week 1: SLP Short Term Goal 1 (Week 1): Pt will recall daily information with min assist cues for use of memory compensatory strategies. SLP Short Term Goal 2 (Week 1): Pt will selectively attend to tasks for 5-7 minute intervals with min cues for redirection. SLP Short Term Goal 3 (Week 1): Pt will complete mildly complex tasks with min verbal cues for functional problem solving.  Skilled Therapeutic Interventions: Skilled treatment session focused on cognitive goals. Upon arrival, patient was supine in bed and requested to use the bathroom. SLP facilitated session by providing Mod verbal cues and extra time for problem solving during bed mobility.  Patient ambulated to the bathroom with the RW with Min A for safety. Patient was continent of urine. SLP also facilitated session by providing extra time and Mod-Max A verbal cues for selective attention in a mildly distracting environment and Min-Mod A verbal cues for functional problem solving during a basic task. Patient reported awareness in regards to decreased attention but unable to self-correct or verbalize ways to help facilitate increased attention like closing the door. Patient left upright in recliner with alarm on and all needs within reach. Continue with current plan of care.      Pain No/Denies Pain  Therapy/Group: Individual Therapy  Yoana Staib, [place] 09/13/2020, 1:22 PM

## 2020-09-14 LAB — GLUCOSE, CAPILLARY
Glucose-Capillary: 102 mg/dL — ABNORMAL HIGH (ref 70–99)
Glucose-Capillary: 96 mg/dL (ref 70–99)
Glucose-Capillary: 121 mg/dL — ABNORMAL HIGH (ref 70–99)
Glucose-Capillary: 118 mg/dL — ABNORMAL HIGH (ref 70–99)

## 2020-09-14 MED ORDER — TOPIRAMATE 25 MG PO TABS
25.0000 mg | ORAL_TABLET | Freq: Every day | ORAL | Status: DC
  Administered 2020-09-14 – 2020-09-15 (×2): 25 mg via ORAL
  Filled 2020-09-14 (×2): qty 1

## 2020-09-15 DIAGNOSIS — D496 Neoplasm of unspecified behavior of brain: Secondary | ICD-10-CM | POA: Diagnosis not present

## 2020-09-15 LAB — GLUCOSE, CAPILLARY
Glucose-Capillary: 109 mg/dL — ABNORMAL HIGH (ref 70–99)
Glucose-Capillary: 129 mg/dL — ABNORMAL HIGH (ref 70–99)

## 2020-09-15 MED ORDER — TRAZODONE HCL 50 MG PO TABS
150.0000 mg | ORAL_TABLET | Freq: Every evening | ORAL | Status: DC | PRN
  Administered 2020-09-15: 150 mg via ORAL
  Filled 2020-09-15: qty 1

## 2020-09-15 NOTE — Progress Notes (Signed)
Occupational Therapy Session Note  Patient Details  Name: Darlene Marsh MRN: [id number] Date of Birth: 1951/05/19  Today's Date: 09/15/2020 OT Individual Time: 1400-1450 OT Individual Time Calculation (min): 50 min    Short Term Goals: Week 1:  OT Short Term Goal 1 (Week 1): Pt will don shirt with min A OT Short Term Goal 2 (Week 1): Pt will complete toilet transfer with CGA OT Short Term Goal 3 (Week 1): Pt will require no more than min cueing for attention to L arm during bathing  Skilled Therapeutic Interventions/Progress Updates:    Pt supine with no c/o pain, agreeable to OT session. Pt alert and oriented but with flat affect. Min A to come EOB. BP assessed seated EOB- 92/63. Standing BP was 81/59 (67). Pt asymptomatic. Discussed findings and pt agreeable to EOB ADLs instead of shower. Pt sat EOB with no LOB for several minutes while OT braided hair and gathered items for bathing. No lateral lean. BP after several minutes of sitting was 85/58 (68). Pt completed UB bathing with poor thoroughness but no physical assist needed. Pt donned shirt with mod A overall- requiring frequent assist to reorient direction of shirt. BP EOB still 81/58 (67) so returned pt to supine for LB d/t lack of hemodynamic stability. Supine BP was 98/58. LB bathing and dressing completed at bed level with increased assist d/t positioning. Pt was left supine, reporting fatigue but all needs within reach. Bed alarm set.   Therapy Documentation Precautions:  Precautions Precautions: Fall Precaution Comments: s/p tumor resection, L lean Restrictions Weight Bearing Restrictions: No   Therapy/Group: Individual Therapy  Curtis Sites 09/15/2020, 6:46 AM

## 2020-09-15 NOTE — Plan of Care (Signed)
  Problem: Consults Goal: RH BRAIN INJURY PATIENT EDUCATION Description: Description: See Patient Education module for eduction specifics Outcome: Progressing   Problem: RH BLADDER ELIMINATION Goal: RH STG MANAGE BLADDER WITH ASSISTANCE Description: STG Manage Bladder With Mod I Assistance Outcome: Progressing   Problem: RH SKIN INTEGRITY Goal: RH STG SKIN FREE OF INFECTION/BREAKDOWN Description: Skin to remain free from infection and breakdown while on rehab. Outcome: Progressing Goal: RH STG ABLE TO PERFORM INCISION/WOUND CARE W/ASSISTANCE Description: STG Able To Perform Incision/Wound Care With supervision Assistance. Outcome: Progressing   Problem: RH SAFETY Goal: RH STG ADHERE TO SAFETY PRECAUTIONS W/ASSISTANCE/DEVICE Description: STG Adhere to Safety Precautions With Mod I Assistance/Device. Outcome: Progressing   Problem: RH PAIN MANAGEMENT Goal: RH STG PAIN MANAGED AT OR BELOW PT'S PAIN GOAL Description: Assess and treatment pain q 4 hr and as needed Outcome: Progressing   Problem: RH KNOWLEDGE DEFICIT BRAIN INJURY Goal: RH STG INCREASE KNOWLEDGE OF SELF CARE AFTER BRAIN INJURY Description: Patient will demonstrate knowledge of medication management, skin/wound care, dietary management, and safety precautions with educational materials and handouts provided by staff, at discharge on an independent level. Outcome: Progressing

## 2020-09-15 NOTE — Progress Notes (Signed)
PROGRESS NOTE   Subjective/Complaints: Darlene Marsh is still sleeping poorly despite 100mg  trazodone- will increase to 150. Grounds pass ordered so she can get some time outside Headaches are much improved. Will maintain topamax 25 since still present.  ROS- denies CP, SOB, N/V?D, +insomnia, headache has improved  Objective:   No results found. No results for input(s): WBC, HGB, HCT, PLT in the last 72 hours. No results for input(s): NA, K, CL, CO2, GLUCOSE, BUN, CREATININE, CALCIUM in the last 72 hours.  Intake/Output Summary (Last 24 hours) at 09/15/2020 0848 Last data filed at 09/15/2020 0730 Gross per 24 hour  Intake 440 ml  Output --  Net 440 ml        Physical Exam: Vital Signs Blood pressure (!) 88/60, pulse 61, temperature 98.7 F (37.1 C), temperature source Oral, resp. rate 18, height 5\' 6"  (1.676 m), weight 57.7 kg, SpO2 99 %. Gen: no distress, normal appearing HEENT: oral mucosa pink and moist, NCAT Cardio: Reg rate Chest: normal effort, normal rate of breathing Abd: soft, non-distended Ext: no edema Psych: pleasant, normal affect Skin: No evidence of breakdown, no evidence of rash Motor 4/5 LUE and LLE, 5/5 in RUE and RLE  Musculoskeletal/Neuro: Full range of motion in all 4 extremities. No joint swelling, left sided neglect  Assessment/Plan: 1. Functional deficits which require 3+ hours per day of interdisciplinary therapy in a comprehensive inpatient rehab setting.  Physiatrist is providing close team supervision and 24 hour management of active medical problems listed below.  Physiatrist and rehab team continue to assess barriers to discharge/monitor patient progress toward functional and medical goals  Care Tool:  Bathing    Body parts bathed by patient: Chest,Abdomen,Right upper leg,Left upper leg,Face   Body parts bathed by helper: Right arm,Front perineal area,Buttocks,Left arm,Right lower  leg,Left lower leg     Bathing assist Assist Level: Maximal Assistance - Patient 24 - 49%     Upper Body Dressing/Undressing Upper body dressing   What is the patient wearing?: Pull over shirt    Upper body assist Assist Level: Maximal Assistance - Patient 25 - 49%    Lower Body Dressing/Undressing Lower body dressing      What is the patient wearing?: Pants,Incontinence brief     Lower body assist Assist for lower body dressing: Maximal Assistance - Patient 25 - 49%     Toileting Toileting    Toileting assist Assist for toileting: Total Assistance - Patient < 25%     Transfers Chair/bed transfer  Transfers assist  Chair/bed transfer activity did not occur: Safety/medical concerns  Chair/bed transfer assist level: Minimal Assistance - Patient > 75%     Locomotion Ambulation   Ambulation assist      Assist level: Minimal Assistance - Patient > 75% Assistive device: Walker-rolling Max distance: 170ft   Walk 10 feet activity   Assist     Assist level: Minimal Assistance - Patient > 75% Assistive device: Walker-rolling   Walk 50 feet activity   Assist    Assist level: Minimal Assistance - Patient > 75% Assistive device: Walker-rolling    Walk 150 feet activity   Assist Walk 150 feet activity did not occur:  Safety/medical concerns (decreased strength/activity tolerance)  Assist level: Minimal Assistance - Patient > 75% Assistive device: Walker-rolling    Walk 10 feet on uneven surface  activity   Assist Walk 10 feet on uneven surfaces activity did not occur: Safety/medical concerns (decreased strength/activity tolerance)         Wheelchair     Assist Will patient use wheelchair at discharge?: No (Per PT long term goals) Type of Wheelchair: Manual    Wheelchair assist level: Total Assistance - Patient < 25% Max wheelchair distance: 2 ft without assist, 148 ft dependent    Wheelchair 50 feet with 2 turns  activity    Assist        Assist Level: Dependent - Patient 0%,Total Assistance - Patient < 25%   Wheelchair 150 feet activity     Assist      Assist Level: Total Assistance - Patient < 25%   Blood pressure (!) 88/60, pulse 61, temperature 98.7 F (37.1 C), temperature source Oral, resp. rate 18, height 5\' 6"  (1.676 m), weight 57.7 kg, SpO2 99 %.  Medical Problem List and Plan: 1.  Decline in ADLs self care and cognition secondary to Right frontal lobe tumor             -patient may  Shower with shower cap             -ELOS/Goals: 10-12d  Continue CIR 2.  Impaired mobility: -DVT/anticoagulation:  Mechanical: Continue Sequential compression devices, below knee Bilateral lower extremities             -antiplatelet therapy: none 3. Headache: Decrease tylenol with codeine to 1 tab q4H prn. Continue topamax 50mg  HS for headache and insomnia 4. Mood: monitor , may need neuropsych eval             -antipsychotic agents: none 5. Neuropsych: This patient is not capable of making decisions on her own behalf. 6. Skin/Wound Care: Monitor incision frontal crani, no dressing needed 7. Fluids/Electrolytes/Nutrition: SLP eval, monitor fluid and caloric intake, poor appetite thus far may need IVF 8.  Cerebral edema, decadron taper 9.  Hx HTN- d/c lisinopril. BP soft to 70s/50s but asymptomatic. Placed nursing order to encourage 6-8 glasses of water per day and liberalized diet to regular.    Vitals:   09/15/20 0558 09/15/20 0604  BP: (!) 70/56 (!) 88/60  Pulse: 75 61  Resp: 17 18  Temp:  98.7 F (37.1 C)  SpO2: 99% 99%  10.  Leukocytosis without fever or other symptoms of infx, likely decadron, will monitor during taper  11.  Hyperglycemia due to steroids: Continue weaning steroids. Can d/c CBGs as have been very mildly elevated.  12.  Seizure d/o associated with frontal mass- cont Keppra 500mg  BID - no driving x 6 mo per [place] law will need to be cleared by neurosurg or  neurology  At that time  13. Insomnia: increase trazodone to 150mg  (low seizure risk). Edited therapy order to request patient be taken outside during the day if possible. Hopefully will improve as steroids are weaned off in 2 days. Grounds pass ordered.  14. Urinary incontinence: UA negative. Will place order for timed voiding q2H during the day.  15. Bradycardia: d/c lisinopril 16. Disposition: emailed rehab team to discuss that patient would prefer d/c Friday if we can achieve supervision goals earlier since she is eager to get home and sleeping poorly in hospital. Husband can provide 24/7 S and daughters are very involved.  LOS: 8 days A FACE TO FACE EVALUATION WAS PERFORMED  Clide Deutscher Sherle Mello 09/15/2020, 8:48 AM

## 2020-09-15 NOTE — Plan of Care (Signed)
  Problem: Consults Goal: RH BRAIN INJURY PATIENT EDUCATION Description: Description: See Patient Education module for eduction specifics Outcome: Progressing   Problem: RH BOWEL ELIMINATION Goal: RH STG MANAGE BOWEL W/MEDICATION W/ASSISTANCE Description: STG Manage Bowel with Medication with Mod I Assistance. Outcome: Progressing   Problem: RH BLADDER ELIMINATION Goal: RH STG MANAGE BLADDER WITH ASSISTANCE Description: STG Manage Bladder With Mod I Assistance Outcome: Progressing   Problem: RH SKIN INTEGRITY Goal: RH STG SKIN FREE OF INFECTION/BREAKDOWN Description: Skin to remain free from infection and breakdown while on rehab. Outcome: Progressing Goal: RH STG ABLE TO PERFORM INCISION/WOUND CARE W/ASSISTANCE Description: STG Able To Perform Incision/Wound Care With supervision Assistance. Outcome: Progressing   Problem: RH SAFETY Goal: RH STG ADHERE TO SAFETY PRECAUTIONS W/ASSISTANCE/DEVICE Description: STG Adhere to Safety Precautions With Mod I Assistance/Device. Outcome: Progressing   Problem: RH PAIN MANAGEMENT Goal: RH STG PAIN MANAGED AT OR BELOW PT'S PAIN GOAL Description: Assess and treatment pain q 4 hr and as needed Outcome: Progressing   Problem: RH KNOWLEDGE DEFICIT BRAIN INJURY Goal: RH STG INCREASE KNOWLEDGE OF SELF CARE AFTER BRAIN INJURY Description: Patient will demonstrate knowledge of medication management, skin/wound care, dietary management, and safety precautions with educational materials and handouts provided by staff, at discharge on an independent level. Outcome: Progressing

## 2020-09-16 ENCOUNTER — Inpatient Hospital Stay: Payer: Medicare Other | Attending: Neurosurgery

## 2020-09-16 DIAGNOSIS — D496 Neoplasm of unspecified behavior of brain: Secondary | ICD-10-CM | POA: Diagnosis not present

## 2020-09-16 DIAGNOSIS — E785 Hyperlipidemia, unspecified: Secondary | ICD-10-CM | POA: Insufficient documentation

## 2020-09-16 DIAGNOSIS — Z803 Family history of malignant neoplasm of breast: Secondary | ICD-10-CM | POA: Insufficient documentation

## 2020-09-16 DIAGNOSIS — C711 Malignant neoplasm of frontal lobe: Secondary | ICD-10-CM | POA: Insufficient documentation

## 2020-09-16 DIAGNOSIS — I10 Essential (primary) hypertension: Secondary | ICD-10-CM | POA: Insufficient documentation

## 2020-09-16 DIAGNOSIS — Z79899 Other long term (current) drug therapy: Secondary | ICD-10-CM | POA: Insufficient documentation

## 2020-09-16 DIAGNOSIS — E079 Disorder of thyroid, unspecified: Secondary | ICD-10-CM | POA: Insufficient documentation

## 2020-09-16 MED ORDER — POLYETHYLENE GLYCOL 3350 17 G PO PACK
17.0000 g | PACK | Freq: Two times a day (BID) | ORAL | Status: DC
  Administered 2020-09-16 – 2020-09-18 (×3): 17 g via ORAL
  Filled 2020-09-16 (×6): qty 1

## 2020-09-16 MED ORDER — TRAZODONE HCL 50 MG PO TABS: 150.0000 mg | ORAL_TABLET | Freq: Every day | ORAL | Status: DC

## 2020-09-16 MED ORDER — MELATONIN 5 MG PO TABS
5.0000 mg | ORAL_TABLET | Freq: Every day | ORAL | Status: DC
  Administered 2020-09-16 – 2020-09-19 (×4): 5 mg via ORAL
  Filled 2020-09-16 (×4): qty 1

## 2020-09-16 NOTE — Progress Notes (Signed)
Occupational Therapy Weekly Progress Note  Patient Details  Name: Darlene Marsh MRN: [id number] Date of Birth: 03-Jan-1951  Beginning of progress report period: September 08, 2020 End of progress report period: September 16, 2020  Today's Date: 09/16/2020 OT Individual Time: 0930-1030 OT Individual Time Calculation (min): 60 min    Patient has met 2 of 3 short term goals.  Pt is making slow progress towards goals.  Pt currently requires increased time and cues for initiation and attention to L environment and body during mobility and self-care tasks.  Pt is able to complete sit > stand and ambulatory transfers with RW with min assist.  Pt is able to complete bathing and dressing with min assist and min cues for attention to L, sequencing, and problem solving.  Pt will benefit from 24/7 supervision due to inattention, awareness deficits, and cues for safety.  Patient continues to demonstrate the following deficits: muscle weakness, motor apraxia, ataxia, decreased coordination and decreased motor planning, decreased attention to left, decreased initiation, decreased attention, decreased awareness, decreased problem solving, decreased safety awareness, decreased memory and delayed processing and decreased sitting balance, decreased standing balance, decreased postural control, hemiplegia and decreased balance strategies and therefore will continue to benefit from skilled OT intervention to enhance overall performance with BADL and Reduce care partner burden.  Patient progressing toward long term goals..  Continue plan of care.  OT Short Term Goals Week 1:  OT Short Term Goal 1 (Week 1): Pt will don shirt with min A OT Short Term Goal 1 - Progress (Week 1): Met OT Short Term Goal 2 (Week 1): Pt will complete toilet transfer with CGA OT Short Term Goal 2 - Progress (Week 1): Progressing toward goal OT Short Term Goal 3 (Week 1): Pt will require no more than min cueing for attention to L arm during  bathing OT Short Term Goal 3 - Progress (Week 1): Met Week 2:  OT Short Term Goal 1 (Week 2): STGs = LTGs due to remaining LOS  Skilled Therapeutic Interventions/Progress Updates:    Treatment session with focus on functional mobility with sit > stand, standing balance/endurance, and L attention.  Pt received supine in bed reporting fatigued but agreeable to therapy session.  Pt declined any bathing/dressing tasks this session.  Completed bed mobility with min assist and min cues for sequencing to increase independence.  Pt completed sit > stand min assist and ambulated 10' to w/c with RW with CGA.  Pt transported to therapy gym for energy conservation.  Engaged in sit> stand at high-low table with min assist to engage in table top task.  Engaged in visual scanning/attention to L during pattern replication.  Pt utilized LUE ~50% of time demonstrating improved initiation and functional use of LUE.  Pt required mod cues for sequencing of diagonal pattern and attention to L half of pattern with more errors in L visual field.  Pt tolerated standing 5 mins at a time before reporting fatigue and requesting to sit. Pt with no c/o dizziness or lightheadedness during session, just fatigue.  Pt returned to room and transferred back to bed CGA with RW.    Therapy Documentation Precautions:  Precautions Precautions: Fall Precaution Comments: s/p tumor resection, L lean Restrictions Weight Bearing Restrictions: No Pain: Pain Assessment Pain Scale: 0-10 Pain Score: 0-No pain   Therapy/Group: Individual Therapy  Simonne Come 09/16/2020, 10:11 AM

## 2020-09-16 NOTE — Progress Notes (Signed)
Physical Therapy Weekly Progress Note  Patient Details  Name: Darlene Marsh MRN: [id number] Date of Birth: September 16, 1950  Beginning of progress report period: September 08, 2020 End of progress report period: September 16, 2020  Today's Date: 09/16/2020 PT Individual Time: 1100-1154 PT Individual Time Calculation (min): 54 min   Patient has met 3 of 4 short term goals. Pt demonstrates slow but steady progress towards long term goals. Pt is currently able to transfer supine<>sitting EOB with min A and sit<>supine with supervision, cues, and increased time. Pt is able to perform transfers with RW and min A, ambulate 150f with RW and min A, and navigate 4 steps with 2 rails and min A. Pt continues to be limited by delayed initiation/processing, L inattention, impaired motor planning, decreased balance/postural control, and decreased safety awareness.  Patient continues to demonstrate the following deficits muscle weakness, decreased cardiorespiratoy endurance, impaired timing and sequencing, decreased coordination and decreased motor planning, decreased initiation, decreased attention, decreased awareness, decreased problem solving, decreased safety awareness and delayed processing and decreased standing balance, decreased postural control and decreased balance strategies and therefore will continue to benefit from skilled PT intervention to increase functional independence with mobility.  Patient progressing toward long term goals..  Continue plan of care.  PT Short Term Goals Week 1:  PT Short Term Goal 1 (Week 1): Patient will perform bed mobility with min a consitently. PT Short Term Goal 1 - Progress (Week 1): Met PT Short Term Goal 2 (Week 1): Patient will complete standardized balance assessment. PT Short Term Goal 2 - Progress (Week 1): Met PT Short Term Goal 3 (Week 1): Patient will perform transfers with min A consistently. PT Short Term Goal 3 - Progress (Week 1): Met PT Short Term Goal 4 (Week  1): Patient will ambulate >100 ft with CGA using LRAD. PT Short Term Goal 4 - Progress (Week 1): Progressing toward goal Week 2:  PT Short Term Goal 1 (Week 2): STG=LTG due to LOS  Skilled Therapeutic Interventions/Progress Updates:  Ambulation/gait training;Cognitive remediation/compensation;Discharge planning;DME/adaptive equipment instruction;Functional mobility training;Pain management;Psychosocial support;Splinting/orthotics;Therapeutic Activities;UE/LE Strength taining/ROM;Visual/perceptual remediation/compensation;Wheelchair propulsion/positioning;UE/LE Coordination activities;Therapeutic Exercise;Stair training;Skin care/wound management;Neuromuscular re-education;Patient/family education;Functional electrical stimulation;Disease management/prevention;Community reintegration;Balance/vestibular training   Today's Interventions: Received pt supine in bed, pt agreeable to therapy with maximal encouragement, and reported pain 8/10 in posterior legs. RN notified and present to administer medication. Session with emphasis on functional mobility/transfers, generalized strengthening, dynamic standing balance/coordination, gait training, and improved activity tolerance. Pt with delayed responses and delayed initiation requiring increased time to get OOB and go to therapy gym. Supine<>sitting EOB with min A with max cues for technique and stand<>pivot bed<>WC with RW and min A. Pt [facility] purposes and performed ambulatory simulated car transfer with RW and min A. Pt then performed TUG x 3 trials with RW and min A with average of 58.6 seconds: Trial 1: 63 seconds (mild LOB when turning around cone and required cues for direction as pt attempting to circle around cone again). Trial 2: 59 seconds Trial 3: 54 seconds Pt educated on test results and significance indicating high fall risk and importance of using RW for safety upon D/C. Pt then ambulated  186fwith RW and CGA/min A; cues to maintain LUE grip on RW. [facility] and agreed to sit in recliner for lunch with encouragement. Stand<>pivot WC<>recliner with RW and min A. Concluded session with pt sitting in recliner,  needs within reach, and chair pad alarm on.  Of note, pt continues to require mod/max cues for safety awareness/hand placement on RW when standing/sitting.   Therapy Documentation Precautions:  Precautions Precautions: Fall Precaution Comments: s/p tumor resection, L lean Restrictions Weight Bearing Restrictions: No  Therapy/Group: Individual Therapy Alfonse Alpers PT, DPT   09/16/2020, 7:28 AM

## 2020-09-16 NOTE — Plan of Care (Signed)
  Problem: RH Balance Goal: LTG Patient will maintain dynamic standing balance (PT) Description: LTG:  Patient will maintain dynamic standing balance with assistance during mobility activities (PT) Flowsheets (Taken 09/16/2020 1222) LTG: Pt will maintain dynamic standing balance during mobility activities with:: (downgraded due to L inattention, delayed initiation, decreased balance/postural control, weakness) Contact Guard/Touching assist Note: downgraded due to L inattention, delayed initiation, decreased balance/postural control, weakness   Problem: Sit to Stand Goal: LTG:  Patient will perform sit to stand with assistance level (PT) Description: LTG:  Patient will perform sit to stand with assistance level (PT) Flowsheets (Taken 09/16/2020 1222) LTG: PT will perform sit to stand in preparation for functional mobility with assistance level: (downgraded due to L inattention, delayed initiation, decreased balance/postural control, weakness) Contact Guard/Touching assist Note: downgraded due to L inattention, delayed initiation, decreased balance/postural control, weakness   Problem: RH Bed to Chair Transfers Goal: LTG Patient will perform bed/chair transfers w/assist (PT) Description: LTG: Patient will perform bed to chair transfers with assistance (PT). Flowsheets (Taken 09/16/2020 1222) LTG: Pt will perform Bed to Chair Transfers with assistance level: (downgraded due to L inattention, delayed initiation, decreased balance/postural control, weakness) Contact Guard/Touching assist Note: downgraded due to L inattention, delayed initiation, decreased balance/postural control, weakness   Problem: RH Car Transfers Goal: LTG Patient will perform car transfers with assist (PT) Description: LTG: Patient will perform car transfers with assistance (PT). Flowsheets (Taken 09/16/2020 1222) LTG: Pt will perform car transfers with assist:: (downgraded due to L inattention, delayed initiation, decreased  balance/postural control, weakness) Contact Guard/Touching assist Note: downgraded due to L inattention, delayed initiation, decreased balance/postural control, weakness   Problem: RH Furniture Transfers Goal: LTG Patient will perform furniture transfers w/assist (OT/PT) Description: LTG: Patient will perform furniture transfers  with assistance (OT/PT). Flowsheets (Taken 09/16/2020 1222) LTG: Pt will perform furniture transfers with assist:: (downgraded due to L inattention, delayed initiation, decreased balance/postural control, weakness) Contact Guard/Touching assist Note: downgraded due to L inattention, delayed initiation, decreased balance/postural control, weakness   Problem: RH Ambulation Goal: LTG Patient will ambulate in controlled environment (PT) Description: LTG: Patient will ambulate in a controlled environment, # of feet with assistance (PT). Flowsheets (Taken 09/16/2020 1222) LTG: Pt will ambulate in controlled environ  assist needed:: (downgraded due to L inattention, delayed initiation, decreased balance/postural control, weakness) Contact Guard/Touching assist LTG: Ambulation distance in controlled environment: 163ft with LRAD Note: downgraded due to L inattention, delayed initiation, decreased balance/postural control, weakness Goal: LTG Patient will ambulate in home environment (PT) Description: LTG: Patient will ambulate in home environment, # of feet with assistance (PT). Flowsheets (Taken 09/16/2020 1222) LTG: Pt will ambulate in home environ  assist needed:: (downgraded due to L inattention, delayed initiation, decreased balance/postural control, weakness) Contact Guard/Touching assist LTG: Ambulation distance in home environment: 54ft with LRAD Note: downgraded due to L inattention, delayed initiation, decreased balance/postural control, weakness

## 2020-09-16 NOTE — Progress Notes (Signed)
PROGRESS NOTE   Subjective/Complaints: Still sleeping poorly despite 150mg  trazodone- discussed discontinuing since not helping. She hopes to return home soon where she can sleep better. Emailed team yesterday to let them know her preferences Headaches much better, she is agreeable to discontinuing topamax   ROS- denies CP, SOB, N/V?D, +insomnia, headache has improved  Objective:   No results found. No results for input(s): WBC, HGB, HCT, PLT in the last 72 hours. No results for input(s): NA, K, CL, CO2, GLUCOSE, BUN, CREATININE, CALCIUM in the last 72 hours.  Intake/Output Summary (Last 24 hours) at 09/16/2020 1226 Last data filed at 09/15/2020 2100 Gross per 24 hour  Intake 270 ml  Output --  Net 270 ml        Physical Exam: Vital Signs Blood pressure 103/66, pulse 66, temperature 97.7 F (36.5 C), resp. rate 16, height 5\' 6"  (1.676 m), weight 57.7 kg, SpO2 97 %. Gen: no distress, normal appearing HEENT: oral mucosa pink and moist, NCAT Cardio: Reg rate Chest: normal effort, normal rate of breathing Abd: soft, non-distended Ext: no edema Psych: pleasant, normal affect Skin: No evidence of breakdown, no evidence of rash Motor 4/5 LUE and LLE, 5/5 in RUE and RLE  Musculoskeletal/Neuro: Full range of motion in all 4 extremities. No joint swelling, left sided neglect  Assessment/Plan: 1. Functional deficits which require 3+ hours per day of interdisciplinary therapy in a comprehensive inpatient rehab setting.  Physiatrist is providing close team supervision and 24 hour management of active medical problems listed below.  Physiatrist and rehab team continue to assess barriers to discharge/monitor patient progress toward functional and medical goals  Care Tool:  Bathing    Body parts bathed by patient: Chest,Abdomen,Right upper leg,Left upper leg,Face   Body parts bathed by helper: Right arm,Front perineal  area,Buttocks,Left arm,Right lower leg,Left lower leg     Bathing assist Assist Level: Maximal Assistance - Patient 24 - 49%     Upper Body Dressing/Undressing Upper body dressing   What is the patient wearing?: Pull over shirt    Upper body assist Assist Level: Maximal Assistance - Patient 25 - 49%    Lower Body Dressing/Undressing Lower body dressing      What is the patient wearing?: Pants,Incontinence brief     Lower body assist Assist for lower body dressing: Maximal Assistance - Patient 25 - 49%     Toileting Toileting    Toileting assist Assist for toileting: Total Assistance - Patient < 25%     Transfers Chair/bed transfer  Transfers assist  Chair/bed transfer activity did not occur: Safety/medical concerns  Chair/bed transfer assist level: Contact Guard/Touching assist     Locomotion Ambulation   Ambulation assist      Assist level: Minimal Assistance - Patient > 75% Assistive device: Walker-rolling Max distance: 13ft   Walk 10 feet activity   Assist     Assist level: Minimal Assistance - Patient > 75% Assistive device: Walker-rolling   Walk 50 feet activity   Assist    Assist level: Minimal Assistance - Patient > 75% Assistive device: Walker-rolling    Walk 150 feet activity   Assist Walk 150 feet activity did not occur:  Safety/medical concerns (decreased strength/activity tolerance)  Assist level: Minimal Assistance - Patient > 75% Assistive device: Walker-rolling    Walk 10 feet on uneven surface  activity   Assist Walk 10 feet on uneven surfaces activity did not occur: Safety/medical concerns (decreased strength/activity tolerance)         Wheelchair     Assist Will patient use wheelchair at discharge?: No (Per PT long term goals) Type of Wheelchair: Manual    Wheelchair assist level: Total Assistance - Patient < 25% Max wheelchair distance: 2 ft without assist, 148 ft dependent    Wheelchair 50 feet  with 2 turns activity    Assist        Assist Level: Dependent - Patient 0%,Total Assistance - Patient < 25%   Wheelchair 150 feet activity     Assist      Assist Level: Total Assistance - Patient < 25%   Blood pressure 103/66, pulse 66, temperature 97.7 F (36.5 C), resp. rate 16, height 5\' 6"  (1.676 m), weight 57.7 kg, SpO2 97 %.  Medical Problem List and Plan: 1.  Decline in ADLs self care and cognition secondary to Right frontal lobe tumor             -patient may  Shower with shower cap             -ELOS/Goals: 10-12d  Continue CIR 2.  Impaired mobility: -DVT/anticoagulation:  Mechanical: Continue Sequential compression devices, below knee Bilateral lower extremities             -antiplatelet therapy: none 3. Headache: Improved. Decrease tylenol with codeine to 1 tab q4H prn. D/c topamax for headache 4. Mood: monitor , may need neuropsych eval             -antipsychotic agents: none 5. Neuropsych: This patient is not capable of making decisions on her own behalf. 6. Skin/Wound Care: Monitor incision frontal crani, no dressing needed 7. Fluids/Electrolytes/Nutrition: SLP eval, monitor fluid and caloric intake, poor appetite thus far may need IVF 8.  Cerebral edema, decadron taper 9.  Hx HTN- d/c lisinopril. BP soft to 70s/50s but asymptomatic. Placed nursing order to encourage 6-8 glasses of water per day and liberalized diet to regular.    Vitals:   09/15/20 1955 09/16/20 0517  BP: (!) 89/53 103/66  Pulse: 63 66  Resp: 18 16  Temp: 98.3 F (36.8 C) 97.7 F (36.5 C)  SpO2: 97% 97%  10.  Leukocytosis without fever or other symptoms of infx, likely decadron, will monitor during taper  11.  Hyperglycemia due to steroids: Continue weaning steroids. Can d/c CBGs as have been very mildly elevated.  12.  Seizure d/o associated with frontal mass- cont Keppra 500mg  BID - no driving x 6 mo per [place] law will need to be cleared by neurosurg or neurology  At that time   13. Insomnia: d/c trazodone since not helping. Hopefully will improve as steroids are weaned off in 2 days. Grounds pass ordered. 14. Urinary incontinence: UA negative. Will place order for timed voiding q2H during the day.  15. Bradycardia: d/c lisinopril 16. Constipation: dulcolax suppository 4/18 17. Disposition: emailed rehab team to discuss that patient would prefer d/c Friday if we can achieve supervision goals earlier since she is eager to get home and sleeping poorly in hospital. Husband can provide 24/7 S and daughters are very involved.   LOS: 9 days A FACE TO FACE EVALUATION WAS PERFORMED  Darlene Marsh Darlene Marsh 09/16/2020, 12:26 PM

## 2020-09-16 NOTE — Progress Notes (Signed)
Speech Language Pathology Daily Session Note  Patient Details  Name: Darlene Marsh MRN: [id number] Date of Birth: [birth year]  Today's Date: 09/16/2020 SLP Individual Time: 1300-1345 SLP Individual Time Calculation (min): 45 min  Short Term Goals: Week 1: SLP Short Term Goal 1 (Week 1): Pt will recall daily information with min assist cues for use of memory compensatory strategies. SLP Short Term Goal 2 (Week 1): Pt will selectively attend to tasks for 5-7 minute intervals with min cues for redirection. SLP Short Term Goal 3 (Week 1): Pt will complete mildly complex tasks with min verbal cues for functional problem solving.  Skilled Therapeutic Interventions:Skilled ST services focused on cognitive skills. SLP facilitated mildly complex problem solving skills in calendar task. Pt required more than an appropriate amount of time for processing and cues for initiation. Pt required max A verbal cues fading to mod A verbal cues only when SLP broke down each appointment step by step. Pt agreed with continued delayed processing and reduced sustained attention. Pt was left in room with call bell within reach and chair alarm set. SLP recommends to continue skilled services.     Pain Pain Assessment Pain Score: 0-No pain  Therapy/Group: Individual Therapy  Raela Bohl  [facility] 09/16/2020, 4:40 PM

## 2020-09-16 NOTE — Progress Notes (Addendum)
Patient observed to sleep for about 3 hours this shift despite PRN trazodone. Last documented bowel movement noted to be 4/13. Bowel sounds active. Patient denies abdominal pain or nausea. Patient receptive to suppository assist. Dulcolax suppository given at 0535.  [phone] Patient had large soft bowel movement on toilet post suppository

## 2020-09-16 NOTE — Plan of Care (Signed)
  Problem: Consults Goal: RH BRAIN INJURY PATIENT EDUCATION Description: Description: See Patient Education module for eduction specifics Outcome: Progressing   Problem: RH BOWEL ELIMINATION Goal: RH STG MANAGE BOWEL W/MEDICATION W/ASSISTANCE Description: STG Manage Bowel with Medication with Mod I Assistance. Outcome: Progressing   Problem: RH BLADDER ELIMINATION Goal: RH STG MANAGE BLADDER WITH ASSISTANCE Description: STG Manage Bladder With Mod I Assistance Outcome: Progressing   Problem: RH SKIN INTEGRITY Goal: RH STG SKIN FREE OF INFECTION/BREAKDOWN Description: Skin to remain free from infection and breakdown while on rehab. Outcome: Progressing Goal: RH STG ABLE TO PERFORM INCISION/WOUND CARE W/ASSISTANCE Description: STG Able To Perform Incision/Wound Care With supervision Assistance. Outcome: Progressing   Problem: RH SAFETY Goal: RH STG ADHERE TO SAFETY PRECAUTIONS W/ASSISTANCE/DEVICE Description: STG Adhere to Safety Precautions With Mod I Assistance/Device. Outcome: Progressing   Problem: RH PAIN MANAGEMENT Goal: RH STG PAIN MANAGED AT OR BELOW PT'S PAIN GOAL Description: Assess and treatment pain q 4 hr and as needed Outcome: Progressing   Problem: RH KNOWLEDGE DEFICIT BRAIN INJURY Goal: RH STG INCREASE KNOWLEDGE OF SELF CARE AFTER BRAIN INJURY Description: Patient will demonstrate knowledge of medication management, skin/wound care, dietary management, and safety precautions with educational materials and handouts provided by staff, at discharge on an independent level. Outcome: Progressing

## 2020-09-17 DIAGNOSIS — D496 Neoplasm of unspecified behavior of brain: Secondary | ICD-10-CM | POA: Diagnosis not present

## 2020-09-17 MED ORDER — ACETAMINOPHEN-CODEINE #3 300-30 MG PO TABS
1.0000 | ORAL_TABLET | Freq: Four times a day (QID) | ORAL | Status: DC | PRN
  Administered 2020-09-17: 1 via ORAL
  Filled 2020-09-17: qty 1

## 2020-09-17 NOTE — Progress Notes (Signed)
Patient ID: Darlene Marsh, female   DOB: 09/22/1950, 70 y.o.   MRN: [id number]  Met with pt and husband who is here to observe in therapies to update team conference progress toward goals of CGA level. Have changed discharge date to 4/22, due to pt's progress in therapies. Both very pleased with this plan and pt feels she will eat better and definitely sleep better. Have scheduled family education for tomorrow @ 9-12 with husband and daughter who will be doing self care with pt. Will await equipment needs and will arrange home health therapy, no preference.

## 2020-09-17 NOTE — Progress Notes (Signed)
Occupational Therapy Session Note  Patient Details  Name: Darlene Marsh MRN: [id number] Date of Birth: 1950/10/03  Today's Date: 09/17/2020 OT Individual Time: 5852-7782 OT Individual Time Calculation (min): 60 min    Short Term Goals: Week 2:  OT Short Term Goal 1 (Week 2): STGs = LTGs due to remaining LOS  Skilled Therapeutic Interventions/Progress Updates:    Treatment session with focus on self-care retraining with dynamic standing balance, functional transfers, and attention to L body and environment.  Pt received semi-reclined in bed reporting minimal sleep overnight and stiffness in her back.  Encouraged pt to engage in mobility and to get OOB to decrease back pain.  Pt completed bed mobility with min assist and increased time and cues for initiation.  Pt completed sit > stand from EOB with CGA.  Pt ambulated to sink with RW with CGA.  Pt engaged in bathing and dressing at sit > stand level at sink, after refusing shower this session.  Pt required min question cues and cues for orientation of shirt as pt demonstrating difficulty on L side.  Pt able to don shirt with supervision/cues this session and increased time.  Therapist educated pt on donning pants with LLE first to increase success, pt donned mesh underwear with pad with mod cues fading to min cues when donning pants.  Pt completed all sit > stand for LB bathing and dressing with CGA and cues for hand placement.  Pt continues to require more than reasonable amount of time during bathing/dressing and requires cues for initiation, orientation, and organization during self-care tasks.  Pt's husband arrived at end of session.  Pt and husband report that pt's daughter will be assisting with self-care tasks.  Pt ambulated to recliner with RW with CGA.  Pt remained semi-reclined with heat pad at lower back, chair alarm on, and all needs in reach.  Therapy Documentation Precautions:  Precautions Precautions: Fall Precaution Comments: s/p  tumor resection, L lean Restrictions Weight Bearing Restrictions: No Pain:  Pt with c/o pain in lower back. Head pad applied, RN notified.   Therapy/Group: Individual Therapy  Simonne Come 09/17/2020, 8:32 AM

## 2020-09-17 NOTE — Patient Care Conference (Signed)
Inpatient RehabilitationTeam Conference and Plan of Care Update Date: 09/17/2020   Time: 9:43 AM    Patient Name: Darlene Marsh      Medical Record Number: [id number]  Date of Birth: 1950/12/15 Sex: Female         Room/Bed: 6C12X/5T70Y-17 Payor Info: Payor: MEDICARE / Plan: MEDICARE PART A AND B / Product Type: *No Product type* /    Admit Date/Time:  09/07/2020  5:22 PM  Primary Diagnosis:  Brain tumor [facility])  Hospital Problems: Principal Problem:   Brain tumor [facility])    Expected Discharge Date: Expected Discharge Date: 09/20/20  Team Members Present: Physician leading conference: Dr. Leeroy Cha Care Coodinator Present: Dorthula Nettles, RN, BSN, CRRN;Becky Dupree, LCSW Nurse Present: Dorthula Nettles, RN PT Present: Estevan Ryder, PT OT Present: Simonne Come, OT SLP Present: Charolett Bumpers, SLP PPS Coordinator present : Gunnar Fusi, SLP     Current Status/Progress Goal Weekly Team Focus  Bowel/Bladder   Continent B/B LBM 4/18 - no incontinent episodes noted last 3 days  Pt will be continent of B/B with less episodes of incontinence  timet toileting during the day.   Swallow/Nutrition/ Hydration   Very poor appetite. Eating at 50 percent or less.         ADL's   min assist for bathing and dressing with increased time and cues for attention and orientation, min assist sit > stand and min - CGA for ambulatory transfers with RW  Supervision overall  ADL retraining, sit > stand, dynamic standing balance, L attention, motor planning and sequencing, family education, d/c planning   Mobility   bed mobility min A, transfers with RW min A, gait 131ft with RW CGA/min A 4 steps 2 rails min A  supervision overall  functional mobility/transfers, generalized strengthening, dynamic standing balance/coordination, ambulation, NMR, L attention, motor planning, initiation, and endurance.   Communication             Safety/Cognition/ Behavioral Observations  Mod A  Supervision, likely will  downgrade to mIn A  mildly complex problem solving, recall and sustained/selective attention   Pain   intermittent leg aching. Tylenol taken with relief at timed.  pain <=2/10  assess q shift and PRN   Skin   Surgical incision to head sutures OTA  skin remain intact with no infection  Assess skin q shift and PRN     Discharge Planning:  Husband has been here and observed in therapies, can provide 24/7 supervision at discharge. Will schedule family education   Team Discussion: Patient still having issues with hypotension, headaches improved so discontinued Topamax. Nursing reports patient is continent, no sleep issues, incision on scalp is clean and dry. Reports BLE pain and treating appropriately. Educating on skin/incision care, dietarynutrition, and safety. Husband needs to come in for family education. Patient on target to meet rehab goals: yes, patient complains of back pain and just can't get comfortable at night. No dizziness this week with therapy. She is supervision with dressing but requires lots of cues and increased time. She is min assist for bed mobility, walks 180 ft with contact guard/min assist. She has contact guard goals. SLP is downgrading her goals to min assist, she has a slow processing time. Speech needs education time with the family.  *See Care Plan and progress notes for long and short-term goals.   Revisions to Treatment Plan:  SLP to downgrade goals.  Teaching Needs: Family education, medication management, pain management, nutritional education, skin/wound care, transfer training, gait training, balance  training, endurance training, stair training, safety awareness.  Current Barriers to Discharge: Decreased caregiver support, Medical stability, Home enviroment access/layout, Wound care, Lack of/limited family support, Medication compliance, Behavior and Nutritional means  Possible Resolutions to Barriers: Continue current medications, offer nutritional  supplements, provide emotional support.     Medical Summary Current Status: scalp incision, decreased appetite, headaches, post-operative pain, insomnia, hypotension  Barriers to Discharge: Medical stability  Barriers to Discharge Comments: scalp incision, decreased appetite, headaches, post-operative pain, insomnia, hypotension Possible Resolutions to [employer] Focus: conitnue daily wound care, d/c lisinopril, monitor BP TID, d/c Topamax as headaches have improved, continue tylenol #3 for post-operative pain   Continued Need for Acute Rehabilitation Level of Care: The patient requires daily medical management by a physician with specialized training in physical medicine and rehabilitation for the following reasons: Direction of a multidisciplinary physical rehabilitation program to maximize functional independence : Yes Medical management of patient stability for increased activity during participation in an intensive rehabilitation regime.: Yes Analysis of laboratory values and/or radiology reports with any subsequent need for medication adjustment and/or medical intervention. : Yes   I attest that I was present, lead the team conference, and concur with the assessment and plan of the team.   Dorthula Nettles G 09/17/2020, 1:30 PM

## 2020-09-17 NOTE — Progress Notes (Signed)
Physical Therapy Session Note  Patient Details  Name: Darlene Marsh MRN: [id number] Date of Birth: 23-Jan-1951  Today's Date: 09/17/2020 PT Individual Time: 1100-1155; [id number] PT Individual Time Calculation (min): 55 min 25 mins  Short Term Goals: Week 2:  PT Short Term Goal 1 (Week 2): STG=LTG due to LOS  Skilled Therapeutic Interventions/Progress Updates:    Session 1: Patient received sitting up in recliner, agreeable to PT. She denies pain when asked, but has heat pack to back, which she reports helps. CGA/MinA stand pivot to wc with RW. PT transporting patient in wc to therapy gym for time management. Standing dual cog + motor task completed matching playing cards. Up to max cuing needed to attend to task and correctly match cards. Patient able to name card that was in her hand, but would have difficulty scanning to find matching card on board, even when placed right in front of her and not biased to the L. Patient requiring seated rest breaks reporting LE fatigue when standing longer than ~5 mins. Very delayed processing and poor attention. Patient completing NuStep on level 3, B LE only. Patient maintaining an average of 4-8 steps per minute despite cues to maintain an average >10 steps/min, which is still very slow. Patient easily distractible throughout session and generally very hypokinetic. Patient returning to room in wc, requesting to return to bed. MinA ambulatory transfer with RW. Bed alarm on, call light within reach.    Session 2: Patient received sitting up in bed, agreeable to PT. She denies pain. Able to come sit edge of bed with supervision and HOB elevated. Extended time to allow patient to sequence movement herself. CGA stand pivot to wc using RW. PT transporting patient in wc to therapy gym for time management. Patient completing visual scanning task on BITS with an average of 3.77s reaction time. Patient then completing simple trail making task with number sequencing. She  required Max verbal cues and explicit directions to complete each segment of the trail. Patient initially able to complete #1-4 with only the initial instructions for task, but after that, required Max verbal cuing. She was able to recite the next number when prompted, but then needed cues to draw line connecting the numbers. Patient requires 5'20" to complete numbers 1-14 before stating that she was too tired to continue. Patient returning to room in wc, transferring to bed with RW and CGA. Bed alarm on, call light within reach.   Therapy Documentation Precautions:  Precautions Precautions: Fall Precaution Comments: s/p tumor resection, L lean Restrictions Weight Bearing Restrictions: No    Therapy/Group: Individual Therapy  Karoline Caldwell, PT, DPT, CBIS  09/17/2020, 7:48 AM

## 2020-09-17 NOTE — Progress Notes (Signed)
Speech Language Pathology Daily Session Note  Patient Details  Name: Darlene Marsh MRN: [id number] Date of Birth: [birth year]  Today's Date: 09/17/2020 SLP Individual Time: 1002-1030 SLP Individual Time Calculation (min): 28 min  Short Term Goals: Week 1: SLP Short Term Goal 1 (Week 1): Pt will recall daily information with min assist cues for use of memory compensatory strategies. SLP Short Term Goal 2 (Week 1): Pt will selectively attend to tasks for 5-7 minute intervals with min cues for redirection. SLP Short Term Goal 3 (Week 1): Pt will complete mildly complex tasks with min verbal cues for functional problem solving.  Skilled Therapeutic Interventions:Skilled ST services focused on education and cognitive skills. Pt's husband present for education. Pt demonstrated recall of yesterday's session with min A verbal cues. SLP facilitated mildly complex problem solving skills in 4 card sequencing task. Pt required extra time, max A initial instruction fading to mod A verbal cues. Pt increased accuracy after verbalize steps, min A verbal cues for verbal problem solving, but required step by step cuing to functionally sequence cards. Pt's husband agreed to manage higher level task such as medication, money and time management. Continued education is needed. Pt was left in room with call bell within reach and chair alarm set. SLP recommends to continue skilled services.     Pain Pain Assessment Pain Scale: 0-10 Pain Score: 0-No pain  Therapy/Group: Individual Therapy  Aleesha Ringstad  [facility] 09/17/2020, 9:56 AM

## 2020-09-17 NOTE — Progress Notes (Signed)
PROGRESS NOTE   Subjective/Complaints: Still sleeping poorly- likely related to steroids/being in hospital Team conference today Not currently dizzy with therapy despite low BP Able to d/c topamax as headaches have improved.    ROS- denies CP, SOB, N/V?D, +insomnia, headache has improved, +post-operative pain  Objective:   No results found. No results for input(s): WBC, HGB, HCT, PLT in the last 72 hours. No results for input(s): NA, K, CL, CO2, GLUCOSE, BUN, CREATININE, CALCIUM in the last 72 hours.  Intake/Output Summary (Last 24 hours) at 09/17/2020 0946 Last data filed at 09/17/2020 0720 Gross per 24 hour  Intake 238 ml  Output --  Net 238 ml        Physical Exam: Vital Signs Blood pressure (!) 112/57, pulse 63, temperature 98.2 F (36.8 C), temperature source Oral, resp. rate 18, height 5\' 6"  (1.676 m), weight 57.7 kg, SpO2 99 %. Gen: no distress, normal appearing HEENT: oral mucosa pink and moist, NCAT Cardio: Reg rate Chest: normal effort, normal rate of breathing Abd: soft, non-distended Ext: no edema Psych: pleasant, normal affect Skin: No evidence of breakdown, no evidence of rash Motor 4/5 LUE and LLE, 5/5 in RUE and RLE  Musculoskeletal/Neuro: Full range of motion in all 4 extremities. No joint swelling, left sided neglect  Assessment/Plan: 1. Functional deficits which require 3+ hours per day of interdisciplinary therapy in a comprehensive inpatient rehab setting.  Physiatrist is providing close team supervision and 24 hour management of active medical problems listed below.  Physiatrist and rehab team continue to assess barriers to discharge/monitor patient progress toward functional and medical goals  Care Tool:  Bathing    Body parts bathed by patient: Right arm,Left arm,Chest,Abdomen,Front perineal area,Buttocks,Face   Body parts bathed by helper: Right arm,Front perineal area,Buttocks,Left  arm,Right lower leg,Left lower leg     Bathing assist Assist Level: Contact Guard/Touching assist     Upper Body Dressing/Undressing Upper body dressing   What is the patient wearing?: Pull over shirt    Upper body assist Assist Level: Supervision/Verbal cueing    Lower Body Dressing/Undressing Lower body dressing      What is the patient wearing?: Underwear/pull up,Pants     Lower body assist Assist for lower body dressing: Minimal Assistance - Patient > 75%     Toileting Toileting    Toileting assist Assist for toileting: Total Assistance - Patient < 25%     Transfers Chair/bed transfer  Transfers assist  Chair/bed transfer activity did not occur: Safety/medical concerns  Chair/bed transfer assist level: Contact Guard/Touching assist     Locomotion Ambulation   Ambulation assist      Assist level: Minimal Assistance - Patient > 75% Assistive device: Walker-rolling Max distance: 185ft   Walk 10 feet activity   Assist     Assist level: Minimal Assistance - Patient > 75% Assistive device: Walker-rolling   Walk 50 feet activity   Assist    Assist level: Minimal Assistance - Patient > 75% Assistive device: Walker-rolling    Walk 150 feet activity   Assist Walk 150 feet activity did not occur: Safety/medical concerns (decreased strength/activity tolerance)  Assist level: Minimal Assistance - Patient > 75% Assistive  device: Walker-rolling    Walk 10 feet on uneven surface  activity   Assist Walk 10 feet on uneven surfaces activity did not occur: Safety/medical concerns (decreased strength/activity tolerance)         Wheelchair     Assist Will patient use wheelchair at discharge?: No (Per PT long term goals) Type of Wheelchair: Manual    Wheelchair assist level: Total Assistance - Patient < 25% Max wheelchair distance: 2 ft without assist, 148 ft dependent    Wheelchair 50 feet with 2 turns activity    Assist         Assist Level: Dependent - Patient 0%,Total Assistance - Patient < 25%   Wheelchair 150 feet activity     Assist      Assist Level: Total Assistance - Patient < 25%   Blood pressure (!) 112/57, pulse 63, temperature 98.2 F (36.8 C), temperature source Oral, resp. rate 18, height 5\' 6"  (1.676 m), weight 57.7 kg, SpO2 99 %.  Medical Problem List and Plan: 1.  Decline in ADLs self care and cognition secondary to Right frontal lobe tumor             -patient may  Shower with shower cap             -ELOS/Goals: 10-12d  Continue CIR 2.  Impaired mobility: -DVT/anticoagulation:  Mechanical: Continue Sequential compression devices, below knee Bilateral lower extremities             -antiplatelet therapy: none 3. Headache: Improved. Decrease tylenol with codeine to 1 tab q6H prn. D/c topamax for headache 4. Mood: monitor , may need neuropsych eval             -antipsychotic agents: none 5. Neuropsych: This patient is not capable of making decisions on her own behalf. 6. Skin/Wound Care: Monitor incision frontal crani, no dressing needed 7. Fluids/Electrolytes/Nutrition: SLP eval, monitor fluid and caloric intake, poor appetite thus far may need IVF 8.  Cerebral edema, decadron taper 9.  Hx HTN- d/c lisinopril. BP soft to 70s/50s but asymptomatic. Placed nursing order to encourage 6-8 glasses of water per day and liberalized diet to regular.    Vitals:   09/16/20 1945 09/17/20 0406  BP: 106/67 (!) 112/57  Pulse: 64 63  Resp: 18 18  Temp: 98.3 F (36.8 C) 98.2 F (36.8 C)  SpO2: 99% 99%  10.  Leukocytosis without fever or other symptoms of infx, likely decadron, will monitor during taper  11.  Hyperglycemia due to steroids: Continue weaning steroids. Can d/c CBGs as have been very mildly elevated.  12.  Seizure d/o associated with frontal mass- cont Keppra 500mg  BID - no driving x 6 mo per [place] law will need to be cleared by neurosurg or neurology  At that time  13.  Insomnia: d/c trazodone since not helping. Hopefully will improve as steroids are weaned off in 2 days. Grounds pass ordered. Continue melatonin 5mg  HS.  14. Urinary incontinence: UA negative. Will place order for timed voiding q2H during the day.  15. Bradycardia: d/c lisinopril 16. Constipation: dulcolax suppository 4/18. Continue colace and Miralax.  17. Disposition: d/c Friday. Husband can provide 24/7 S and daughters are very involved.   LOS: 10 days A FACE TO FACE EVALUATION WAS PERFORMED  Clide Deutscher Ollin Hochmuth 09/17/2020, 9:46 AM

## 2020-09-17 NOTE — Plan of Care (Signed)
  Problem: RH Problem Solving Goal: LTG Patient will demonstrate problem solving for (SLP) Description: LTG:  Patient will demonstrate problem solving for basic/complex daily situations with cues  (SLP) Flowsheets (Taken 09/17/2020 0953) LTG: Patient will demonstrate problem solving for (SLP): (mildly complex problem solving) Other (comment) LTG Patient will demonstrate problem solving for: Minimal Assistance - Patient > 75% Note: Downgraded due to slow progress and short than expected LOS   Problem: RH Memory Goal: LTG Patient will use memory compensatory aids to (SLP) Description: LTG:  Patient will use memory compensatory aids to recall biographical/new, daily complex information with cues (SLP) Flowsheets (Taken 09/17/2020 0953) LTG: Patient will use memory compensatory aids to (SLP): Minimal Assistance - Patient > 75% Note: Downgraded due to slow progress and shorter than expected LOS   Problem: RH Attention Goal: LTG Patient will demonstrate this level of attention during functional activites (SLP) Description: LTG:  Patient will will demonstrate this level of attention during functional activites (SLP) Flowsheets (Taken 09/17/2020 0953) LTG: Patient will demonstrate this level of attention during cognitive/linguistic activities with assistance of (SLP): Minimal Assistance - Patient > 75% Note: Downgraded due to slow progress and shorter than expected LOS

## 2020-09-17 NOTE — Plan of Care (Signed)
  Problem: RH Eating Goal: LTG Patient will perform eating w/assist, cues/equip (OT) Description: LTG: Patient will perform eating with assist, with/without cues using equipment (OT) Flowsheets (Taken 09/17/2020 1319) LTG: Pt will perform eating with assistance level of: Supervision/Verbal cueing Note: As pt requires occasional cues for L attention during eating   Problem: RH Toilet Transfers Goal: LTG Patient will perform toilet transfers w/assist (OT) Description: LTG: Patient will perform toilet transfers with assist, with/without cues using equipment (OT) Flowsheets (Taken 09/17/2020 1319) LTG: Pt will perform toilet transfers with assistance level of: (downgraded) Contact Guard/Touching assist Note: Downgraded due to L inattention, decreased balance/postural control   Problem: RH Tub/Shower Transfers Goal: LTG Patient will perform tub/shower transfers w/assist (OT) Description: LTG: Patient will perform tub/shower transfers with assist, with/without cues using equipment (OT) Flowsheets (Taken 09/17/2020 1319) LTG: Pt will perform tub/shower stall transfers with assistance level of: (downgraded) Contact Guard/Touching assist Note: Downgraded due to L inattention, decreased balance/postural control   Problem: RH Awareness Goal: LTG: Patient will demonstrate awareness during functional activites type of (OT) Description: LTG: Patient will demonstrate awareness during functional activites type of (OT) Flowsheets (Taken 09/17/2020 1319) Patient will demonstrate awareness during functional activites type of: Emergent

## 2020-09-17 NOTE — Progress Notes (Signed)
Melatonin given as scheduled. No effects noted. Patient was awake or easily awaken during all rounds. No complaints of pain this shift.

## 2020-09-18 DIAGNOSIS — D496 Neoplasm of unspecified behavior of brain: Secondary | ICD-10-CM | POA: Diagnosis not present

## 2020-09-18 LAB — GLUCOSE, CAPILLARY: Glucose-Capillary: 91 mg/dL (ref 70–99)

## 2020-09-18 MED ORDER — ACETAMINOPHEN-CODEINE #3 300-30 MG PO TABS
1.0000 | ORAL_TABLET | Freq: Three times a day (TID) | ORAL | Status: DC | PRN
  Administered 2020-09-18: 1 via ORAL
  Filled 2020-09-18: qty 1

## 2020-09-18 NOTE — Progress Notes (Signed)
PROGRESS NOTE   Subjective/Complaints: Patient's chart reviewed- No issues reported overnight Vitals signs stable Ambulated >275 feet!  ROS- denies CP, SOB, N/V?D, +insomnia, headache has improved, +post-operative pain  Objective:   No results found. No results for input(s): WBC, HGB, HCT, PLT in the last 72 hours. No results for input(s): NA, K, CL, CO2, GLUCOSE, BUN, CREATININE, CALCIUM in the last 72 hours.  Intake/Output Summary (Last 24 hours) at 09/18/2020 1229 Last data filed at 09/18/2020 0743 Gross per 24 hour  Intake 480 ml  Output --  Net 480 ml        Physical Exam: Vital Signs Blood pressure 120/65, pulse 64, temperature 98 F (36.7 C), temperature source Oral, resp. rate 18, height 5\' 6"  (1.676 m), weight 57.7 kg, SpO2 97 %. Gen: no distress, normal appearing HEENT: oral mucosa pink and moist, NCAT Cardio: Reg rate Chest: normal effort, normal rate of breathing Abd: soft, non-distended Ext: no edema Psych: pleasant, normal affect Skin: No evidence of breakdown, no evidence of rash Motor 4/5 LUE and LLE, 5/5 in RUE and RLE  Musculoskeletal/Neuro: Full range of motion in all 4 extremities. No joint swelling, left sided neglect  Assessment/Plan: 1. Functional deficits which require 3+ hours per day of interdisciplinary therapy in a comprehensive inpatient rehab setting.  Physiatrist is providing close team supervision and 24 hour management of active medical problems listed below.  Physiatrist and rehab team continue to assess barriers to discharge/monitor patient progress toward functional and medical goals  Care Tool:  Bathing    Body parts bathed by patient: Right arm,Left arm,Chest,Abdomen,Front perineal area,Buttocks,Face   Body parts bathed by helper: Right arm,Front perineal area,Buttocks,Left arm,Right lower leg,Left lower leg     Bathing assist Assist Level: Contact Guard/Touching  assist     Upper Body Dressing/Undressing Upper body dressing   What is the patient wearing?: Pull over shirt    Upper body assist Assist Level: Supervision/Verbal cueing    Lower Body Dressing/Undressing Lower body dressing      What is the patient wearing?: Underwear/pull up,Pants     Lower body assist Assist for lower body dressing: Supervision/Verbal cueing     Toileting Toileting    Toileting assist Assist for toileting: Contact Guard/Touching assist     Transfers Chair/bed transfer  Transfers assist  Chair/bed transfer activity did not occur: Safety/medical concerns  Chair/bed transfer assist level: Contact Guard/Touching assist     Locomotion Ambulation   Ambulation assist      Assist level: Minimal Assistance - Patient > 75% Assistive device: Walker-rolling Max distance: 226ft   Walk 10 feet activity   Assist     Assist level: Minimal Assistance - Patient > 75% Assistive device: Walker-rolling   Walk 50 feet activity   Assist    Assist level: Minimal Assistance - Patient > 75% Assistive device: Walker-rolling    Walk 150 feet activity   Assist Walk 150 feet activity did not occur: Safety/medical concerns (decreased strength/activity tolerance)  Assist level: Minimal Assistance - Patient > 75% Assistive device: Walker-rolling    Walk 10 feet on uneven surface  activity   Assist Walk 10 feet on uneven surfaces activity did not  occur: Safety/medical concerns (decreased strength/activity tolerance)   Assist level: Minimal Assistance - Patient > 75% Assistive device: Aeronautical engineer Will patient use wheelchair at discharge?: No (Per PT long term goals) Type of Wheelchair: Manual    Wheelchair assist level: Total Assistance - Patient < 25% Max wheelchair distance: 2 ft without assist, 148 ft dependent    Wheelchair 50 feet with 2 turns activity    Assist        Assist Level: Dependent -  Patient 0%,Total Assistance - Patient < 25%   Wheelchair 150 feet activity     Assist      Assist Level: Total Assistance - Patient < 25%   Blood pressure 120/65, pulse 64, temperature 98 F (36.7 C), temperature source Oral, resp. rate 18, height 5\' 6"  (1.676 m), weight 57.7 kg, SpO2 97 %.  Medical Problem List and Plan: 1.  Decline in ADLs self care and cognition secondary to Right frontal lobe tumor             -patient may  Shower with shower cap             -ELOS/Goals: 10-12d  Continue CIR 2.  Impaired mobility: -DVT/anticoagulation:  Mechanical: Continue Sequential compression devices, below knee Bilateral lower extremities             -antiplatelet therapy: none 3. Headache: Improved. Decrease tylenol with codeine to 1 tab q8H prn. D/c topamax for headache 4. Mood: monitor , may need neuropsych eval             -antipsychotic agents: none 5. Neuropsych: This patient is not capable of making decisions on her own behalf. 6. Skin/Wound Care: Monitor incision frontal crani, no dressing needed 7. Fluids/Electrolytes/Nutrition: SLP eval, monitor fluid and caloric intake, poor appetite thus far may need IVF 8.  Cerebral edema, decadron taper completed, may d/c pantoprazole.  9.  Hx HTN- d/c lisinopril. BP soft to 70s/50s but asymptomatic. Placed nursing order to encourage 6-8 glasses of water per day and liberalized diet to regular.    Vitals:   09/17/20 1903 09/18/20 0350  BP: 102/63 120/65  Pulse: 71 64  Resp: 18 18  Temp: 98.8 F (37.1 C) 98 F (36.7 C)  SpO2: 99% 97%  10.  Leukocytosis without fever or other symptoms of infx, likely decadron, will monitor during taper  11.  Hyperglycemia due to steroids: Continue weaning steroids. Can d/c CBGs as have been very mildly elevated.  12.  Seizure d/o associated with frontal mass- cont Keppra 500mg  BID - no driving x 6 mo per [place] law will need to be cleared by neurosurg or neurology  At that time  13. Insomnia: d/c  trazodone since not helping. Hopefully will improve as steroids are weaned off in 2 days. Grounds pass ordered. Continue melatonin 5mg  HS.  14. Urinary incontinence: UA negative. Will place order for timed voiding q2H during the day.  15. Bradycardia: d/c lisinopril 16. Constipation: dulcolax suppository 4/18. Continue colace and Miralax.  17. Disposition: d/c Friday. Husband can provide 24/7 S and daughters are very involved. F/u with me 9:20 for 9:40 on 5/10/    LOS: 11 days A FACE TO FACE EVALUATION WAS [place] 09/18/2020, 12:29 PM

## 2020-09-18 NOTE — Progress Notes (Signed)
Patient ID: Darlene Marsh, female   DOB: 1951-03-20, 70 y.o.   MRN: [id number]  Met with patient to discuss her upcoming discharge. She reports that she is ready and feels that therapy has prepared her. She awaits equipment and this RN/CM explained how the morning of discharge typically happens. She verbalized understanding and was excited to be discharged earlier than expected. She has my card if she has any further questions, comments, or concerns.   Dorthula Nettles, RN3, BSN, CBIS, [place], [facility], Inpatient Rehabilitation Office [phone] Cell [phone]

## 2020-09-18 NOTE — Progress Notes (Signed)
Speech Language Pathology Weekly Progress and Session Note  Patient Details  Name: Darlene Marsh MRN: [id number] Date of Birth: 08/28/1950  Beginning of progress report period: September 08, 2020 End of progress report period: September 18, 2020  Short Term Goals: Week 1: SLP Short Term Goal 1 (Week 1): Pt will recall daily information with min assist cues for use of memory compensatory strategies. SLP Short Term Goal 1 - Progress (Week 1): Met SLP Short Term Goal 2 (Week 1): Pt will selectively attend to tasks for 5-7 minute intervals with min cues for redirection. SLP Short Term Goal 2 - Progress (Week 1): Met SLP Short Term Goal 3 (Week 1): Pt will complete mildly complex tasks with min verbal cues for functional problem solving. SLP Short Term Goal 3 - Progress (Week 1): Not met    New Short Term Goals: Week 2: SLP Short Term Goal 1 (Week 2): STG=LTG due to short ELOS  Weekly Progress Updates: Pt made good progress meeting 2 out 3 goals. Pt's primary deficit is in delayed processing and attention, impacting mildly complex problem solving and short term recall. Pt required min-mod A verbal cues for the above listed deficits in functional tasks. Family education has been completed. Pt would continue to benefit from skilled ST services in order to maximize functional independence and reduce burden of care, requiring 24 hour supervision at discharge with continued skilled ST services.       Intensity: Minumum of 1-2 x/day, 30 to 90 minutes Frequency: 3 to 5 out of 7 days Duration/Length of Stay: 4/22 Treatment/Interventions: Cognitive remediation/compensation;Cueing hierarchy;Environmental controls;Functional tasks;Patient/family education;Internal/external aids  General    Pain Pain Assessment Pain Scale: 0-10 Pain Score: 0-No pain  Therapy/Group: Individual Therapy  MADISON  CRATCH 09/18/2020, 4:06 PM         

## 2020-09-18 NOTE — Progress Notes (Signed)
Speech Language Pathology Daily Session Note  Patient Details  Name: Darlene Marsh MRN: [id number] Date of Birth: 1950-07-16  Today's Date: 09/18/2020 SLP Individual Time: 0902-1000 SLP Individual Time Calculation (min): 58 min  Short Term Goals: Week 1: SLP Short Term Goal 1 (Week 1): Pt will recall daily information with min assist cues for use of memory compensatory strategies. SLP Short Term Goal 2 (Week 1): Pt will selectively attend to tasks for 5-7 minute intervals with min cues for redirection. SLP Short Term Goal 3 (Week 1): Pt will complete mildly complex tasks with min verbal cues for functional problem solving.  Skilled Therapeutic Interventions: Skilled ST services focused on education and cognitive skills. Pt's husband and daughter were present and active in education. Pt was unable to initially recall yesterday's ST session, however with mod-min A verbal cues could recall task. SLP facilitated familiar basic to mildly complex problem solving, recall and selective attention in card task, Blink. Pt did not recall card task previously played. Pt was able to recall 3 rules with min A fade to supervision A verbal cues for use of written aid. Pt demonstrated basic problem solving mod A fade to supervision A verbal cues and mildly complex problem solving with mod A verbal cues. Pt demonstrated increase selective attention, initially attempting to select SLP's cards, however faded to mod I self cuing to focus on own cards. SLP provided education pertaining delayed processing (extra time and types of cuing), basic-mildly complex problem solving, selective attention and memory (recommending memory notebook.) Family asked appropriate questions and answered to satisfaction.  Pt was left in room with family, call bell within reach and bed alarm set. SLP recommends to continue skilled services.     Pain Pain Assessment Pain Score: 0-No pain  Therapy/Group: Individual Therapy  [name]   [facility] 09/18/2020, 3:55 PM

## 2020-09-18 NOTE — Progress Notes (Signed)
Physical Therapy Session Note  Patient Details  Name: Darlene Marsh MRN: [id number] Date of Birth: [birth year]  Today's Date: 09/18/2020 PT Individual Time: 1000-1056 PT Individual Time Calculation (min): 56 min   Short Term Goals: Week 1:  PT Short Term Goal 1 (Week 1): Patient will perform bed mobility with min a consitently. PT Short Term Goal 1 - Progress (Week 1): Met PT Short Term Goal 2 (Week 1): Patient will complete standardized balance assessment. PT Short Term Goal 2 - Progress (Week 1): Met PT Short Term Goal 3 (Week 1): Patient will perform transfers with min A consistently. PT Short Term Goal 3 - Progress (Week 1): Met PT Short Term Goal 4 (Week 1): Patient will ambulate >100 ft with CGA using LRAD. PT Short Term Goal 4 - Progress (Week 1): Progressing toward goal Week 2:  PT Short Term Goal 1 (Week 2): STG=LTG due to LOS  Skilled Therapeutic Interventions/Progress Updates:   Received pt sitting EOB with husband and daughter present for family education training. Pt agreeable to therapy and denied any pain. Session with focus on discharge planning, functional mobility/transfers, generalized strengthening, dynamic standing balance/coordination, ambulation, simulated car transfers, stair navigation, and improved activity tolerance. Stand<>pivot bed<>WC with RW and CGA and transported to 67M orto gym in [facility] total A for time management purposes. Pt performed ambulatory simulated car transfer with RW and CGA x 2 trials. Trial 1: with therapist and trial 2: with pt's husband. Pt required min cues and increased time for getting LEs into car. Pt then ambulated 78f on uneven surfaces (ramp) with RW and min A provided by pt's husband. Educated family on importance of remaining on pt's L side due to mild L inattention and tendency to forget to keep LUE on RW handgrip. Also encouraged husband to use gait belt for safety; pt's husband in agreement. Pt navigated 4 steps with L handrail and CGA/min A  provided by pt's husband using a lateral stepping technique. Educated pt's husband on technique and importance of placing RW at top of stairs ready for pt when she gets to the top. Pt required cues for hand placement on railing as pt with preference to hold onto both rails. Pt ambulated 278fwith RW and min provided by pt's husband up to room including getting on/off elevator. Pt's family with questions regarding ordering WC for [place] distances; referred to CSMakotiConcluded session with pt sitting EOB with all needs within reach and family present at bedside awaiting OT session.   Therapy Documentation Precautions:  Precautions Precautions: Fall Precaution Comments: s/p tumor resection, L lean Restrictions Weight Bearing Restrictions: No  Therapy/Group: Individual Therapy AnAlfonse AlpersT, DPT   09/18/2020, 7:22 AM

## 2020-09-18 NOTE — Progress Notes (Signed)
Inpatient Rehabilitation Care Coordinator Discharge Note  The overall goal for the admission was met for:   Discharge location: Yes-HOME WITH HUSBAND PROVIDING 24/7 SUPERVISION AND DAUGHTER'S TO ASSIST WITH BATHING AND DRESSING  Length of Stay: Yes-13 DAYS  Discharge activity level: Yes-SUPERVISION WITH CUES  Home/community participation: Yes  Services provided included: MD, RD, PT, OT, SLP, RN, CM, Pharmacy, Neuropsych and SW  Financial Services: Medicare  Choices offered to/list presented to:YES  Follow-up services arranged: Home Health: [facility]-PT,OT,SP, DME: [facility], 3 IN 1 and Patient/Family has no preference for HH/DME agencies  Comments (or additional information):HUSBAND AND DAUGHTER [place]  Patient/Family verbalized understanding of follow-up arrangements: Yes  Individual responsible for coordination of the follow-up plan: ARTHUR-HUSBAND [phone]  Confirmed correct DME delivered: Elease Hashimoto 09/18/2020    Mitchael Luckey, [place]

## 2020-09-18 NOTE — Progress Notes (Signed)
Occupational Therapy Session Note  Patient Details  Name: Darlene Marsh MRN: [id number] Date of Birth: 1950-10-18  Today's Date: 09/18/2020 OT Individual Time: 1103-1200 OT Individual Time Calculation (min): 57 min    Short Term Goals: Week 2:  OT Short Term Goal 1 (Week 2): STGs = LTGs due to remaining LOS  Skilled Therapeutic Interventions/Progress Updates:    Treatment session with focus on hands on family education with pt, pt's husband, and pt's daughter in preparation for upcoming d/c.  Pt received seated EOB with family present.  Pt's daughter asking questions about DME and bathroom transfers.  Placed 3 in 1 in shower facing towards shower ledge to allow pt to step over threshold backwards.  Pt ambulated in to bathroom with RW with CGA and completed transfer stepping over threshold with CGA to 3 in 1 with pt utilizing arm rests on 3 in 1.  Pt completed transfer again with pt's daughter providing the CGA and occasional cues for technique and improved safety.  Completed transfer to/from toilet with 3 in 1 over toilet, discussed safety with dynamic standing balance during clothing management and hygiene.  Therapist educated pt and pt's family members on L inattention during mobility and self-care tasks, encouraging them to be on L side when appropriate to increase attention to L.  Educated on providing time and question cues to allow pt increased active participation in self-care tasks.  Pt changed pants and donned clean underwear with close supervision and increased time to allow pt to process, demonstrating improved sequencing and awareness.  Pt's husband and daughter verbalizing pleased with pt progress and understanding of recommendations from therapist.  Pt remained upright in recliner with chair alarm on and all needs in reach.  SWK in to discuss follow up and equipment.  Therapy Documentation Precautions:  Precautions Precautions: Fall Precaution Comments: s/p tumor resection, L  lean Restrictions Weight Bearing Restrictions: No Pain:  Pt with no c/o pain   Therapy/Group: Individual Therapy  Simonne Come 09/18/2020, 12:12 PM

## 2020-09-18 NOTE — Progress Notes (Signed)
Occupational Therapy Discharge Summary  Patient Details  Name: Darlene Marsh MRN: [id number] Date of Birth: July 22, 1950   Patient has met 54 of 10 long term goals due to improved activity tolerance, improved balance, postural control, ability to compensate for deficits, functional use of  LEFT upper and LEFT lower extremity, improved attention and improved awareness.  Patient to discharge at overall Supervision level with bathing and dressing tasks, CGA for ambulatory transfers.  Patient's care partner is independent to provide the necessary physical and cognitive assistance at discharge.  Patient's husband and daughter, Darlene Marsh, have been present for hands on education, demonstrating and verbalizing understanding of recommendations.  Pt's daughter to provide majority of assistance with self-care tasks and has demonstrated ability to provide appropriate level of cues and physical assistance /CGA for transfers.  Reasons goals not met: N/A  Recommendation:  Patient will benefit from ongoing skilled OT services in home health setting to continue to advance functional skills in the area of BADL and Reduce care partner burden.  Equipment: 3 in 1  Reasons for discharge: treatment goals met and discharge from hospital  Patient/family agrees with progress made and goals achieved: Yes  OT Discharge Precautions/Restrictions  Precautions Precautions: Fall Precaution Comments: s/p tumor resection, L inattention Restrictions Weight Bearing Restrictions: No General   Vital Signs Therapy Vitals Temp: 98.5 F (36.9 C) Temp Source: Oral Pulse Rate: 65 Resp: 20 BP: 112/66 Oxygen Therapy SpO2: 97 % O2 Device: Room Air Pain Pain Assessment Pain Scale: 0-10 Pain Score: 0-No pain ADL ADL Eating: Set up,Supervision/safety Where Assessed-Eating: Chair Grooming: Supervision/safety Where Assessed-Grooming: Sitting at sink,Standing at sink Upper Body Bathing: Supervision/safety Where  Assessed-Upper Body Bathing: Shower Lower Body Bathing: Supervision/safety Where Assessed-Lower Body Bathing: Shower Upper Body Dressing: Supervision/safety Where Assessed-Upper Body Dressing: Edge of bed Lower Body Dressing: Supervision/safety Where Assessed-Lower Body Dressing: Edge of bed Toileting: Supervision/safety Where Assessed-Toileting: Glass blower/designer: Therapist, music Method: Counselling psychologist: Radiographer, therapeutic: Unable to assess Social research officer, government: Curator Method: Heritage manager: Other (comment) (3 in 1) Vision Baseline Vision/History: Wears glasses Wears Glasses: Reading only Patient Visual Report: No change from baseline Vision Assessment?: Yes Ocular Range of Motion: Restricted on the left Alignment/Gaze Preference: Gaze right Tracking/Visual Pursuits: Impaired - to be further tested in functional context;Requires cues, head turns, or add eye shifts to track;Decreased smoothness of horizontal tracking;Decreased smoothness of eye movement to RIGHT superior field;Decreased smoothness of eye movement to LEFT superior field;Decreased smoothness of eye movement to RIGHT inferior field;Decreased smoothness of eye movement to LEFT inferior field Saccades: Impaired - to be further tested in functional context;Additional eye shifts occurred during testing;Additional head turns occurred during testing Visual Fields: Left visual field deficit Perception  Perception: Impaired Inattention/Neglect: Does not attend to left side of body;Does not attend to left visual field (however improved from eval) Praxis Praxis: Impaired Praxis Impairment Details: Motor planning;Initiation Cognition Overall Cognitive Status: Impaired/Different from baseline Arousal/Alertness: Awake/alert Attention: Selective Selective Attention: Impaired Memory: Impaired Memory Impairment:  Decreased recall of new information Awareness: Impaired Problem Solving: Impaired Safety/Judgment: Impaired Sensation Sensation Light Touch: Appears Intact Hot/Cold: Appears Intact Proprioception: Impaired by gross assessment (impaired in LUE) Coordination Gross Motor Movements are Fluid and Coordinated: No Fine Motor Movements are Fluid and Coordinated: No Coordination and Movement Description: Limited by motor planning deficits and L inattention Finger Nose Finger Test: [facility] Motor  Motor Motor: Hemiplegia;Motor apraxia Motor - Skilled Clinical Observations: L hemi, limited mainly  by inattention and poor proprioceptive awareness  Balance Static Sitting Balance Static Sitting - Balance Support: Feet supported Static Sitting - Level of Assistance: 6: Modified independent (Device/Increase time) Dynamic Sitting Balance Dynamic Sitting - Level of Assistance: 5: Stand by assistance Static Standing Balance Static Standing - Balance Support: During functional activity Static Standing - Level of Assistance: 5: Stand by assistance Dynamic Standing Balance Dynamic Standing - Balance Support: During functional activity Dynamic Standing - Level of Assistance: 5: Stand by assistance Extremity/Trunk Assessment RUE Assessment RUE Assessment: Within Functional Limits LUE Assessment LUE Assessment: Exceptions to [facility] General Strength Comments: 3-/5 MMT shoulder, 3+/5 MMT of the elbow and hand. Overall functional from a strength perspective but limited by inattention   Chaddrick Brue, [facility] 09/18/2020, 4:09 PM

## 2020-09-19 DIAGNOSIS — D496 Neoplasm of unspecified behavior of brain: Secondary | ICD-10-CM | POA: Diagnosis not present

## 2020-09-19 MED ORDER — ACETAMINOPHEN-CODEINE #3 300-30 MG PO TABS
1.0000 | ORAL_TABLET | Freq: Two times a day (BID) | ORAL | Status: DC | PRN
  Administered 2020-09-19: 1 via ORAL
  Filled 2020-09-19: qty 1

## 2020-09-19 NOTE — Progress Notes (Signed)
PROGRESS NOTE   Subjective/Complaints: Headaches are better controlled. Vitals stable D/c home She finally slept well!  ROS- denies CP, SOB, N/V?D, sleeping better, headache has improved, +post-operative pain, denies constipation  Objective:   No results found. No results for input(s): WBC, HGB, HCT, PLT in the last 72 hours. No results for input(s): NA, K, CL, CO2, GLUCOSE, BUN, CREATININE, CALCIUM in the last 72 hours.  Intake/Output Summary (Last 24 hours) at 09/19/2020 0833 Last data filed at 09/18/2020 2054 Gross per 24 hour  Intake 270 ml  Output 1 ml  Net 269 ml        Physical Exam: Vital Signs Blood pressure 115/71, pulse 66, temperature 98.5 F (36.9 C), temperature source Oral, resp. rate 14, height 5\' 6"  (1.676 m), weight 57.7 kg, SpO2 100 %. Gen: no distress, normal appearing HEENT: oral mucosa pink and moist, NCAT Cardio: Reg rate Chest: normal effort, normal rate of breathing Abd: soft, non-distended Ext: no edema Psych: pleasant, normal affect Skin: No evidence of breakdown, no evidence of rash Motor 4/5 LUE and LLE, 5/5 in RUE and RLE  Musculoskeletal/Neuro: Full range of motion in all 4 extremities. No joint swelling, left sided neglect  Assessment/Plan: 1. Functional deficits which require 3+ hours per day of interdisciplinary therapy in a comprehensive inpatient rehab setting.  Physiatrist is providing close team supervision and 24 hour management of active medical problems listed below.  Physiatrist and rehab team continue to assess barriers to discharge/monitor patient progress toward functional and medical goals  Care Tool:  Bathing    Body parts bathed by patient: Right arm,Left arm,Chest,Abdomen,Front perineal area,Buttocks,Face   Body parts bathed by helper: Right arm,Front perineal area,Buttocks,Left arm,Right lower leg,Left lower leg     Bathing assist Assist Level: Contact  Guard/Touching assist     Upper Body Dressing/Undressing Upper body dressing   What is the patient wearing?: Pull over shirt    Upper body assist Assist Level: Supervision/Verbal cueing    Lower Body Dressing/Undressing Lower body dressing      What is the patient wearing?: Underwear/pull up     Lower body assist Assist for lower body dressing: Supervision/Verbal cueing     Toileting Toileting    Toileting assist Assist for toileting: Contact Guard/Touching assist     Transfers Chair/bed transfer  Transfers assist  Chair/bed transfer activity did not occur: Safety/medical concerns  Chair/bed transfer assist level: Independent with assistive device Chair/bed transfer assistive device: Programmer, multimedia   Ambulation assist      Assist level: Independent with assistive device Assistive device: Walker-rolling Max distance: 256ft   Walk 10 feet activity   Assist  Walk 10 feet activity did not occur: Safety/medical concerns  Assist level: Independent with assistive device Assistive device: Walker-rolling   Walk 50 feet activity   Assist    Assist level: Minimal Assistance - Patient > 75% Assistive device: Walker-rolling    Walk 150 feet activity   Assist Walk 150 feet activity did not occur: Safety/medical concerns (decreased strength/activity tolerance)  Assist level: Minimal Assistance - Patient > 75% Assistive device: Walker-rolling    Walk 10 feet on uneven surface  activity  Assist Walk 10 feet on uneven surfaces activity did not occur: Safety/medical concerns (decreased strength/activity tolerance)   Assist level: Minimal Assistance - Patient > 75% Assistive device: Aeronautical engineer Will patient use wheelchair at discharge?: No (Per PT long term goals) Type of Wheelchair: Manual    Wheelchair assist level: Total Assistance - Patient < 25% Max wheelchair distance: 2 ft without assist, 148  ft dependent    Wheelchair 50 feet with 2 turns activity    Assist        Assist Level: Dependent - Patient 0%,Total Assistance - Patient < 25%   Wheelchair 150 feet activity     Assist      Assist Level: Total Assistance - Patient < 25%   Blood pressure 115/71, pulse 66, temperature 98.5 F (36.9 C), temperature source Oral, resp. rate 14, height 5\' 6"  (1.676 m), weight 57.7 kg, SpO2 100 %.  Medical Problem List and Plan: 1.  Decline in ADLs self care and cognition secondary to Right frontal lobe tumor             -patient may  Shower with shower cap             -ELOS/Goals: 10-12d  Continue CIR 2.  Impaired mobility: Continue Sequential compression devices, below knee Bilateral lower extremities             -antiplatelet therapy: none 3. Headache: Improved. Decrease tylenol with codeine to 1 tab q12H prn. D/c topamax for headache 4. Mood: monitor , may need neuropsych eval             -antipsychotic agents: none 5. Neuropsych: This patient is not capable of making decisions on her own behalf. 6. Skin/Wound Care: Monitor incision frontal crani, no dressing needed 7. Fluids/Electrolytes/Nutrition: SLP eval, monitor fluid and caloric intake 8.  Cerebral edema, decadron taper completed, may d/c pantoprazole.  9.  Hx HTN- d/c lisinopril. BP soft to 70s/50s but asymptomatic. Placed nursing order to encourage 6-8 glasses of water per day and liberalized diet to regular.    Vitals:   09/19/20 0522 09/19/20 0606  BP: 125/73 115/71  Pulse: 82 66  Resp: 14 14  Temp: 98.2 F (36.8 C) 98.5 F (36.9 C)  SpO2: 98% 100%  10.  Leukocytosis without fever or other symptoms of infx, likely decadron, will monitor during taper  11.  Hyperglycemia due to steroids: d/c CBG checks since now off steroids 12.  Seizure d/o associated with frontal mass- cont Keppra 500mg  BID - no driving x 6 mo per [place] law will need to be cleared by neurosurg or neurology  At that time  13.  Insomnia: d/c trazodone since not helping. Hopefully will improve as steroids are weaned off in 2 days. Grounds pass ordered. Continue melatonin 5mg  HS.  14. Urinary incontinence: UA negative. Will place order for timed voiding q2H during the day.  15. Bradycardia: d/c lisinopril 16. Constipation: dulcolax suppository 4/18. Continue colace and Miralax.  17. Disposition: d/c Friday. Husband can provide 24/7 S and daughters are very involved. F/u with me 9:20 for 9:40 on 5/10/ Reviewed medications and d/c plan   LOS: 12 days A FACE TO FACE EVALUATION WAS PERFORMED  Clide Deutscher Jaecion Dempster 09/19/2020, 8:33 AM

## 2020-09-19 NOTE — Progress Notes (Addendum)
Physical Therapy Discharge Summary  Patient Details  Name: Darlene Marsh MRN: [id number] Date of Birth: Jul 27, 1950  Patient has met 11 of 11 long term goals due to improved activity tolerance, improved balance, improved postural control, increased strength, improved attention, improved awareness and improved coordination. Patient to discharge at an ambulatory level CGA.  Patient's care partner is independent to provide the necessary physical and cognitive assistance at discharge. Pt's family attended family education training on 4/20 and verbalized and demonstrated confidence with all tasks to ensure safe discharge home.   All goals met   Recommendation:  Patient will benefit from ongoing skilled PT services in home health setting to continue to advance safe functional mobility, address ongoing impairments in transfers, generalized strengthening, dynamic standing balance/coordination, gait training, NMR, endurance, and to minimize fall risk.  Equipment: RW and 16x16 manual WC  Reasons for discharge: treatment goals met  Patient/family agrees with progress made and goals achieved: Yes  PT Discharge Precautions/Restrictions Precautions Precautions: Fall Precaution Comments: s/p tumor resection, L inattention Restrictions Weight Bearing Restrictions: No Cognition Overall Cognitive Status: Impaired/Different from baseline Arousal/Alertness: Awake/alert Orientation Level: Oriented to person;Oriented to place;Oriented to situation Memory: Impaired Awareness: Impaired Problem Solving: Impaired Safety/Judgment: Impaired Sensation Sensation Light Touch: Appears Intact Proprioception: Impaired by gross assessment Coordination Gross Motor Movements are Fluid and Coordinated: No Fine Motor Movements are Fluid and Coordinated: No Coordination and Movement Description: mild uncoordination due to L hemi, L inattention, decreased balance, and generalized weakness Finger Nose Finger Test:  mild dysmetria bilaterally Heel Shin Test: [facility] bilaterally Motor  Motor Motor: Hemiplegia;Motor apraxia Motor - Skilled Clinical Observations: L hemi, generalized weakness, limited mainly by inattention and poor proprioceptive awareness  Mobility Bed Mobility Bed Mobility: Rolling Right;Rolling Left;Sit to Supine;Supine to Sit Rolling Right: Supervision/verbal cueing Rolling Left: Supervision/Verbal cueing Supine to Sit: Supervision/Verbal cueing Sit to Supine: Supervision/Verbal cueing Transfers Transfers: Sit to Stand;Stand to Sit;Stand Pivot Transfers Sit to Stand: Contact Guard/Touching assist Stand to Sit: Contact Guard/Touching assist Stand Pivot Transfers: Contact Guard/Touching assist Stand Pivot Transfer Details: Verbal cues for precautions/safety;Verbal cues for safe use of DME/AE Stand Pivot Transfer Details (indicate cue type and reason): verbal cues for hand placement during transfers and for RW safety Transfer (Assistive device): Rolling walker Locomotion  Gait Ambulation: Yes Gait Assistance: Contact Guard/Touching assist Gait Distance (Feet): 150 Feet Assistive device: Rolling walker Gait Assistance Details: Verbal cues for precautions/safety;Verbal cues for safe use of DME/AE Gait Assistance Details: verbal cues for L attention and for RW safety Gait Gait: Yes Gait Pattern: Impaired Gait Pattern: Decreased step length - left;Decreased step length - right;Step-to pattern;Trunk flexed;Poor foot clearance - right;Poor foot clearance - left;Narrow base of support;Decreased trunk rotation;Decreased stride length Gait velocity: reduced Stairs / Additional Locomotion Stairs: Yes Stairs Assistance: Contact Guard/Touching assist Stair Management Technique: Two rails Number of Stairs: 12 Height of Stairs: 6 Ramp: Contact Guard/touching assist (RW) Wheelchair Mobility Wheelchair Mobility: Yes Wheelchair Assistance: Minimal assistance - Patient >75% Wheelchair  Propulsion: Both upper extremities Wheelchair Parts Management: Needs assistance Distance: 1102f  Trunk/Postural Assessment  Cervical Assessment Cervical Assessment: Within Functional Limits Thoracic Assessment Thoracic Assessment: Exceptions to WFL (kyphosis) Lumbar Assessment Lumbar Assessment: Exceptions to [facility](posterior pelvic tilt) Postural Control Postural Control: Deficits on evaluation  Balance Balance Balance Assessed: Yes Static Sitting Balance Static Sitting - Balance Support: Feet supported;No upper extremity supported Static Sitting - Level of Assistance: 7: Independent Dynamic Sitting Balance Dynamic Sitting - Balance Support: Feet supported;Bilateral upper extremity supported Dynamic Sitting -  Level of Assistance: 7: Independent Static Standing Balance Static Standing - Balance Support: Bilateral upper extremity supported (RW) Static Standing - Level of Assistance: 5: Stand by assistance (CGA) Dynamic Standing Balance Dynamic Standing - Balance Support: Bilateral upper extremity supported (RW) Dynamic Standing - Level of Assistance: 5: Stand by assistance (CGA) Extremity Assessment  RLE Assessment RLE Assessment: Exceptions to [facility] Active Range of Motion (AROM) Comments: WFL for all functional mobility General Strength Comments: grossly generalized to 4/5 in sitting LLE Assessment LLE Assessment: Exceptions to [facility] Active Range of Motion (AROM) Comments: WFL for all functional mobility General Strength Comments: grossly generalized to 4/5 in sitting  Vanderbilt PT, DPT  09/19/2020, 7:42 AM

## 2020-09-19 NOTE — Progress Notes (Signed)
Physical Therapy Session Note  Patient Details  Name: Darlene Marsh MRN: [id number] Date of Birth: 1951/04/10  Today's Date: 09/19/2020 PT Individual Time: 6387-5643 and 1400-1440 PT Individual Time Calculation (min): 39 min and 40 min  Short Term Goals: Week 1:  PT Short Term Goal 1 (Week 1): Patient will perform bed mobility with min a consitently. PT Short Term Goal 1 - Progress (Week 1): Met PT Short Term Goal 2 (Week 1): Patient will complete standardized balance assessment. PT Short Term Goal 2 - Progress (Week 1): Met PT Short Term Goal 3 (Week 1): Patient will perform transfers with min A consistently. PT Short Term Goal 3 - Progress (Week 1): Met PT Short Term Goal 4 (Week 1): Patient will ambulate >100 ft with CGA using LRAD. PT Short Term Goal 4 - Progress (Week 1): Progressing toward goal Week 2:  PT Short Term Goal 1 (Week 2): STG=LTG due to LOS  Skilled Therapeutic Interventions/Progress Updates:   Treatment Session 1: 1115-1154 39 min Received pt sitting in recliner reporting discomfort in LEs from sitting in recliner; lowered legrest and pt reported immediate relief. Pt agreeable to therapy and denied any pain during session. Session with emphasis on discharge planning, functional mobility/transfers, generalized strengthening, dynamic standing balance/coordination, simulated car transfers, and improved activity tolerance. Stand<>pivot recliner<>WC without AD and CGA and transported to 4W therapy gym in [facility] total A for time management purposes. Pt navigated 12 steps with 2 rails and CGA ascending with a step through and descending with a step to pattern. Pt stood and picked dup small cup from floor without AD and min A for balance. Pt performed WC mobility 1103f using BUE and min A with max cues for hand placement and propulsion technique/stride. Pt [facility] to [facility] recliner feeling uncomfortable. Educated pt on [employer] management including locking/unlocking brakes  and donning/doffing legrests. Concluded session with pt sitting in WC, needs within reach, and chair pad alarm on.   Treatment Session 2: [id number] min Received pt supine in bed, pt agreeable to therapy, and denied any pain during session. Session with emphasis on functional mobility/transfers, generalized strengthening, dynamic standing balance/coordination, gait training, and improved activity tolerance. Pt's equipment delivered to room. Adjusted RW to correct height and set up WC. Pt transferred supine<>sitting EOB with supervision and stand<>pivot bed<>WC with RW and CGA. Pt able to demonstrate understanding of brake management with supervision and min cues. Pt [facility] 4W [facility] for time management purposes and ambulated [id number] 2 trials with RW and CGA/close supervision. Pt demonstrates improvements in L attention/awareness and able to keep LUE on RW handgrip. Worked on dynamic standing balance performing alternating toe taps to 3in step with min handheld assist 2x20 reps. [facility] and assisted pt in calling her husband to tell him equipment was delivered. Concluded session with pt sitting in WC, needs within reach, and chair pad alarm on.   Therapy Documentation Precautions:  Precautions Precautions: Fall Precaution Comments: s/p tumor resection, L inattention Restrictions Weight Bearing Restrictions: No  Therapy/Group: Individual Therapy AnAlfonse AlpersT, DPT   09/19/2020, 7:28 AM

## 2020-09-19 NOTE — Progress Notes (Signed)
Speech Language Pathology Discharge Summary  Patient Details  Name: Darlene Marsh MRN: [id number] Date of Birth: May 27, 1951  Today's Date: 09/19/2020 SLP Individual Time: 1215-1300 SLP Individual Time Calculation (min): 45 min   Skilled Therapeutic Interventions: Skilled SLP intervention focused on education and cognition. Pt educated on strategies to increase focus and attention including eliminating distractions, no talking, no background noises or voices, tv off, and completing a single task at a time. She demonstrated slow processing and required increased time with simple card sorting task this session. She reported difficulty attending to task due to feeling hungry and this session being right before lunch. Discharge summary completed. Pt discharging home tomorrow.      Patient has met 1 of 3 long term goals.  Patient to discharge at overall Mod;Min level.  Reasons goals not met: progressing toward goal   Clinical Impression/Discharge Summary:   Pt has made consistent progress and has met 1 of 3 LTG's and partially met 2/3 LTG's this admission due to improved understanding of memory aids and use of memory aids, improved problem solving and improved attention. She currently overall Min to mod A  For cognitive tasks and requires verbal redirection for mildly complex selective attention tasks. Pt/family education complete and pt will discharge home with 24 hr supervision from husband. Pt will benefit from follow up slp services to maximize functional independence, safety, and problem solving for home environment.     Care Partner:  Caregiver Able to Provide Assistance: Yes     Recommendation:  Home Health SLP;24 hour supervision/assistance  Rationale for SLP Follow Up: Maximize cognitive function and independence;Reduce caregiver burden   Equipment: none at this time.   Reasons for discharge: Discharged from [facility] 09/19/2020, 12:56 PM

## 2020-09-19 NOTE — Progress Notes (Signed)
Occupational Therapy Session Note  Patient Details  Name: Darlene Marsh MRN: [id number] Date of Birth: 08/25/1950  Today's Date: 09/19/2020 OT Individual Time: 0900-1009 OT Individual Time Calculation (min): 69 min    Short Term Goals: Week 1:  OT Short Term Goal 1 (Week 1): Pt will don shirt with min A OT Short Term Goal 1 - Progress (Week 1): Met OT Short Term Goal 2 (Week 1): Pt will complete toilet transfer with CGA OT Short Term Goal 2 - Progress (Week 1): Progressing toward goal OT Short Term Goal 3 (Week 1): Pt will require no more than min cueing for attention to L arm during bathing OT Short Term Goal 3 - Progress (Week 1): Met Week 2:  OT Short Term Goal 1 (Week 2): STGs = LTGs due to remaining LOS  Skilled Therapeutic Interventions/Progress Updates:    Pt seated EOB upon arrival with husband present. OT intervention with focus on bathing at shower level, dressing with sit<>stand from bench, functional amb and transfers with RW, standing balance at sink for grooming, and bed mobility to prepare for discharge home tomorrow. Pt requires min verbal cues for safety awareness and attention. Pt's husband present during session. Pt also requires min verbal cues for task initiation. Minimal L inattention noted during session. Pt returned to bed and remained in bed with all needs within reach. Bed alarm activated. Husband present.   Therapy Documentation Precautions:  Precautions Precautions: Fall Precaution Comments: s/p tumor resection, L inattention Restrictions Weight Bearing Restrictions: No   Pain:  Pt denies pain this morning   Therapy/Group: Individual Therapy  Leroy Libman 09/19/2020, 10:09 AM

## 2020-09-20 DIAGNOSIS — D496 Neoplasm of unspecified behavior of brain: Secondary | ICD-10-CM | POA: Diagnosis not present

## 2020-09-20 MED ORDER — MELATONIN 5 MG PO TABS: 5.0000 mg | ORAL_TABLET | Freq: Every day | ORAL | 0 refills | Status: DC

## 2020-09-20 MED ORDER — ACETAMINOPHEN-CODEINE #3 300-30 MG PO TABS: 1.0000 | ORAL_TABLET | Freq: Two times a day (BID) | ORAL | 0 refills | Status: DC | PRN

## 2020-09-20 MED ORDER — LEVETIRACETAM 500 MG PO TABS: 500.0000 mg | ORAL_TABLET | Freq: Two times a day (BID) | ORAL | 0 refills | Status: DC

## 2020-09-20 NOTE — Discharge Summary (Signed)
Physician Discharge Summary  Patient ID: Darlene Marsh MRN: [id number] DOB/AGE: November 27, 1950 70 y.o.  Admit date: 09/07/2020 Discharge date: 09/20/2020  Discharge Diagnoses:  Principal Problem:   Brain tumor [facility]) Active Problems:   Tremor   Insomnia   Seizure disorder [facility])   Discharged Condition: Stable   Significant Diagnostic Studies: N/A  Labs:  Basic Metabolic Panel: BMP Latest Ref Rng & Units 09/09/2020 09/07/2020 09/05/2020  Glucose 70 - 99 mg/dL 138(H) 145(H) 96  BUN 8 - 23 mg/dL 18 13 10   Creatinine 0.44 - 1.00 mg/dL 0.68 0.58 0.73  BUN/Creat Ratio 6 - 22 (calc) - - -  Sodium 135 - 145 mmol/L 135 137 139  Potassium 3.5 - 5.1 mmol/L 3.9 3.8 3.9  Chloride 98 - 111 mmol/L 100 103 104  CO2 22 - 32 mmol/L 27 26 28   Calcium 8.9 - 10.3 mg/dL 9.1 8.6(L) 9.4    CBC: CBC Latest Ref Rng & Units 09/09/2020 09/07/2020 09/05/2020  WBC 4.0 - 10.5 K/uL 12.4(H) 21.3(H) 8.8  Hemoglobin 12.0 - 15.0 g/dL 14.8 13.4 13.5  Hematocrit 36.0 - 46.0 % 41.5 37.6 39.3  Platelets 150 - 400 K/uL 340 281 312    CBG: No results for input(s): GLUCAP in the last 168 hours.  Brief HPI:   Darlene Marsh is a 70 y.o. female with history of HTN, tremors, hyperlipidemia who was admitted o 09/05/20 with left-sided motor seizure and difficulty using her arm and leg.  She was started on Keppra for seizure prophylaxis.  She was found to have 3 cm enhancing out in right superior frontal gyrus/SMA region and underwent right frontal interhemispheric tumor resection on the same day by Dr. Marcello Moores..  Started on Decadron protocol and maintained on Keppra for seizure prophylaxis.  Therapy evaluations completed and patient was noted to have limitations due to left hemiplegia with tremors.  CIR was recommended due to functional decline.   Hospital Course: Darlene Marsh was admitted to rehab 09/07/2020 for inpatient therapies to consist of PT, ST and OT at least three hours five days a week. Past admission physiatrist, therapy team  and rehab RN have worked together to provide customized collaborative inpatient rehab. Headaches have improved with use of tylenol prn. Decadron was tapered off and reactive leucocytosis is resolving.  Blood pressures were monitored on TID basis and were noted to be low therefore lisinopril was discontinued. BS were monitored with ac/hs CBG checks while on steroids and were d/c as noted to have mild elevation and anticipate them to further improve.  and nd SSI was use prn for tighter BS control. She has had issues with insomnia felt to be due to hospitalization and melatonin was added with some improvement in sleep. Constipation has resolved with augmentation of bowel regimen. She has been seizure free during her stay. She continues to be limited by mild ataxia with inattention. Supervision to CGA is recommended for safety. She will continue to receive follow up [place], [place] and [facility] by [place] after discharge.    Rehab course: During patient's stay in rehab weekly team conferences were held to monitor patient's progress, set goals and discuss barriers to discharge. At admission, patient required mod assist with basic ADL tasks and with mobility. She exhibited moderate cognitive deficits with delay in processing and required min to mod assist with mildly complex tasks. She has had improvement in activity tolerance, balance, postural control as well as ability to compensate for deficits.  She was able to complete ADL tasks with  supervision. She required contact-guard assist for transfers and to ambulate 150 feet with rolling walker.  She required contact-guard assist to climb 12 stairs. She requires min to mod assist for cognitive tasks as well as redirection for attention to complete mildly complex tasks. Family education was completed.   Disposition: Home  Diet: Regular.   Special Instructions: 1. No driving for 6 months or till cleared by Neurology.   2. Family to assist with medications.      Allergies as of 09/20/2020   No Known Allergies     Medication List    STOP taking these medications   ibuprofen 200 MG tablet Commonly known as: ADVIL   Janssen COVID-19 Vaccine 0.5 ML injection Generic drug: COVID-19 Ad26 vaccine (JANSSEN/J&J)   lisinopril 10 MG tablet Commonly known as: ZESTRIL   methylPREDNISolone 4 MG Tbpk tablet Commonly known as: MEDROL DOSEPAK     TAKE these medications   acetaminophen-codeine 300-30 MG tablet Commonly known as: TYLENOL #3 Take 1 tablet by mouth every 12 (twelve) hours as needed for severe pain. Notes to patient: Limit to one pill per day   levETIRAcetam 500 MG tablet Commonly known as: KEPPRA Take 1 tablet (500 mg total) by mouth 2 (two) times daily.   melatonin 5 MG Tabs Take 1 tablet (5 mg total) by mouth daily after supper.       Follow-up Information    Raulkar, Clide Deutscher, MD Follow up.   Specialty: Physical Medicine and Rehabilitation Why: 10/08/20: please arrive at 9:20 for 9:40 appointment  Contact information: [address] [place] [address] [phone]        Vallarie Mare, MD. Call in 1 day(s).   Specialty: Neurosurgery Why: Call for appointment Contact information: [place] [address] [phone]        Alycia Rossetti, MD. Call.   Specialty: Family Medicine Why: for post hospital follow up Contact information: [place] [address] [place] [address] [phone]               Signed: Bary Leriche 09/25/2020, 2:48 PM

## 2020-09-20 NOTE — Discharge Instructions (Signed)
Inpatient Rehab Discharge Instructions  Darlene Marsh Discharge date and time:   09/20/20  Activities/Precautions/ Functional Status: Activity: No driving or strenuous activity till cleared by MD.  Diet: regular diet Wound Care: Routine skin checks   Functional status:  ___ No restrictions     ___ Walk up steps independently _X__ 24/7 supervision/assistance   ___ Walk up steps with assistance ___ Intermittent supervision/assistance  ___ Bathe/dress independently ___ Walk with walker     _X__ Bathe/dress with assistance ___ Walk Independently    ___ Shower independently ___ Walk with assistance    ___ Shower with assistance _X__ No alcohol     ___ Return to work/school ________  Special Instructions:  Per [employer] statutes, patients with seizures are not allowed to drive until  they have been seizure-free for six months. Use caution when using heavy equipment or power tools. Avoid working on ladders or at heights. Take showers instead of baths. Ensure the water temperature is not too high on the home water heater. Do not go swimming alone. When caring for infants or small children, sit down when holding, feeding, or changing them to minimize risk of injury to the child in the event you have a seizure. Also, Maintain good sleep hygiene. Avoid alcohol.     COMMUNITY REFERRALS UPON DISCHARGE:    Home Health:   PT, OT ,[place] Equipment/Items Ordered:WHEELCHAIR, Vassie Moselle, 3 IN 1                                                 Agency/Supplier:[facility]  [phone]  My questions have been answered and I understand these instructions. I will adhere to these goals and the provided educational materials after my discharge from the hospital.  Patient/Caregiver Signature _______________________________ Date __________  Clinician Signature _______________________________________ Date  __________  Please bring this form and your medication list with you to all your follow-up doctor's appointments.

## 2020-09-20 NOTE — Progress Notes (Addendum)
PROGRESS NOTE   Subjective/Complaints: No complaints this morning Excited to go home! Has made great progress! Husband at bedside, discussed d/c plain  ROS- denies CP, SOB, N/V?D, sleeping better, headache has improved, +post-operative pain, denies constipation  Objective:   No results found. No results for input(s): WBC, HGB, HCT, PLT in the last 72 hours. No results for input(s): NA, K, CL, CO2, GLUCOSE, BUN, CREATININE, CALCIUM in the last 72 hours.  Intake/Output Summary (Last 24 hours) at 09/20/2020 1035 Last data filed at 09/20/2020 0834 Gross per 24 hour  Intake 100 ml  Output --  Net 100 ml        Physical Exam: Vital Signs Blood pressure 130/70, pulse 62, temperature 98.4 F (36.9 C), temperature source Oral, resp. rate 14, height 5\' 6"  (1.676 m), weight 57.7 kg, SpO2 100 %. Gen: no distress, normal appearing HEENT: oral mucosa pink and moist, NCAT Cardio: Reg rate Chest: normal effort, normal rate of breathing Abd: soft, non-distended Ext: no edema Psych: pleasant, normal affect Skin: No evidence of breakdown, no evidence of rash Motor 4/5 LUE and LLE, 5/5 in RUE and RLE  Musculoskeletal/Neuro: Full range of motion in all 4 extremities. No joint swelling, left sided neglect  Assessment/Plan: 1. Functional deficits which require 3+ hours per day of interdisciplinary therapy in a comprehensive inpatient rehab setting.  Physiatrist is providing close team supervision and 24 hour management of active medical problems listed below.  Physiatrist and rehab team continue to assess barriers to discharge/monitor patient progress toward functional and medical goals  Care Tool:  Bathing    Body parts bathed by patient: Right arm,Left arm,Chest,Abdomen,Front perineal area,Buttocks,Face,Right upper leg,Left upper leg,Right lower leg,Left lower leg   Body parts bathed by helper: Right arm,Front perineal  area,Buttocks,Left arm,Right lower leg,Left lower leg     Bathing assist Assist Level: Supervision/Verbal cueing     Upper Body Dressing/Undressing Upper body dressing   What is the patient wearing?: Button up shirt    Upper body assist Assist Level: Supervision/Verbal cueing    Lower Body Dressing/Undressing Lower body dressing      What is the patient wearing?: Underwear/pull up,Pants     Lower body assist Assist for lower body dressing: Supervision/Verbal cueing     Toileting Toileting    Toileting assist Assist for toileting: Supervision/Verbal cueing     Transfers Chair/bed transfer  Transfers assist  Chair/bed transfer activity did not occur: Safety/medical concerns  Chair/bed transfer assist level: Contact Guard/Touching assist Chair/bed transfer assistive device: Programmer, multimedia   Ambulation assist      Assist level: Contact Guard/Touching assist Assistive device: Walker-rolling Max distance: >123ft   Walk 10 feet activity   Assist  Walk 10 feet activity did not occur: Safety/medical concerns  Assist level: Contact Guard/Touching assist Assistive device: Walker-rolling   Walk 50 feet activity   Assist    Assist level: Contact Guard/Touching assist Assistive device: Walker-rolling    Walk 150 feet activity   Assist Walk 150 feet activity did not occur: Safety/medical concerns (decreased strength/activity tolerance)  Assist level: Contact Guard/Touching assist Assistive device: Walker-rolling    Walk 10 feet on uneven surface  activity  Assist Walk 10 feet on uneven surfaces activity did not occur: Safety/medical concerns (decreased strength/activity tolerance)   Assist level: Contact Guard/Touching assist Assistive device: Walker-rolling   Wheelchair     Assist Will patient use wheelchair at discharge?: No Type of Wheelchair: Manual    Wheelchair assist level: Minimal Assistance - Patient > 75% Max  wheelchair distance: 115ft    Wheelchair 50 feet with 2 turns activity    Assist        Assist Level: Minimal Assistance - Patient > 75%   Wheelchair 150 feet activity     Assist      Assist Level: Moderate Assistance - Patient 50 - 74%   Blood pressure 130/70, pulse 62, temperature 98.4 F (36.9 C), temperature source Oral, resp. rate 14, height 5\' 6"  (1.676 m), weight 57.7 kg, SpO2 100 %.  Medical Problem List and Plan: 1.  Decline in ADLs self care and cognition secondary to Right frontal lobe tumor             -patient may  Shower with shower cap             -ELOS/Goals: 10-12d  D/c home 2.  Impaired mobility: d/c Sequential compression devices, below knee Bilateral lower extremities             -antiplatelet therapy: none 3. Headache: Improved. Decrease tylenol with codeine to 1 tab q12H prn. D/c topamax for headache 4. Mood: monitor , may need neuropsych eval             -antipsychotic agents: none 5. Neuropsych: This patient is not capable of making decisions on her own behalf. 6. Skin/Wound Care: Monitor incision frontal crani, no dressing needed 7. Fluids/Electrolytes/Nutrition: SLP eval, monitor fluid and caloric intake 8.  Cerebral edema, decadron taper completed, may d/c pantoprazole.  9.  Hx HTN: BP slightly elevated today but has usually been soft, monitor outpatient.    Vitals:   09/19/20 1931 09/20/20 0447  BP: (!) 141/83 130/70  Pulse: 69 62  Resp: 14 14  Temp: 98.8 F (37.1 C) 98.4 F (36.9 C)  SpO2:  100%  10.  Leukocytosis without fever or other symptoms of infx, likely decadron, will monitor during taper  11.  Hyperglycemia due to steroids: d/c CBG checks since now off steroids 12.  Seizure d/o associated with frontal mass- cont Keppra 500mg  BID - no driving x 6 mo per [place] law will need to be cleared by neurosurg or neurology  At that time Discussed that [place] is usually continued 1 year after a seizure.  13. Insomnia: d/c trazodone  since not helping. Hopefully will improve as steroids are weaned off in 2 days. Grounds pass ordered. Continue melatonin 5mg  HS.  14. Urinary incontinence: UA negative. Will place order for timed voiding q2H during the day.  15. Bradycardia: d/c lisinopril 16. Constipation: dulcolax suppository 4/18. D/c laxatives 17. Disposition: d/c Friday. Husband can provide 24/7 S and daughters are very involved. F/u with me 9:20 for 9:40 on 5/10/ Reviewed medications and d/c plan   >30 minutes spent in discharge of patient including review of medications and follow-up appointments, physical examination, and in answering all patient's questions  LOS: 13 days A FACE TO FACE EVALUATION WAS [place] 09/20/2020, 10:35 AM

## 2020-09-22 DIAGNOSIS — R32 Unspecified urinary incontinence: Secondary | ICD-10-CM | POA: Diagnosis not present

## 2020-09-22 DIAGNOSIS — G8192 Hemiplegia, unspecified affecting left dominant side: Secondary | ICD-10-CM | POA: Diagnosis not present

## 2020-09-22 DIAGNOSIS — G4700 Insomnia, unspecified: Secondary | ICD-10-CM | POA: Diagnosis not present

## 2020-09-22 DIAGNOSIS — E785 Hyperlipidemia, unspecified: Secondary | ICD-10-CM | POA: Diagnosis not present

## 2020-09-22 DIAGNOSIS — I10 Essential (primary) hypertension: Secondary | ICD-10-CM | POA: Diagnosis not present

## 2020-09-22 DIAGNOSIS — C711 Malignant neoplasm of frontal lobe: Secondary | ICD-10-CM | POA: Diagnosis not present

## 2020-09-22 DIAGNOSIS — Z9181 History of falling: Secondary | ICD-10-CM | POA: Diagnosis not present

## 2020-09-22 DIAGNOSIS — E039 Hypothyroidism, unspecified: Secondary | ICD-10-CM | POA: Diagnosis not present

## 2020-09-22 DIAGNOSIS — R251 Tremor, unspecified: Secondary | ICD-10-CM | POA: Diagnosis not present

## 2020-09-22 DIAGNOSIS — G40909 Epilepsy, unspecified, not intractable, without status epilepticus: Secondary | ICD-10-CM | POA: Diagnosis not present

## 2020-09-22 DIAGNOSIS — R270 Ataxia, unspecified: Secondary | ICD-10-CM | POA: Diagnosis not present

## 2020-09-22 DIAGNOSIS — Z483 Aftercare following surgery for neoplasm: Secondary | ICD-10-CM | POA: Diagnosis not present

## 2020-09-23 LAB — SURGICAL PATHOLOGY

## 2020-09-24 ENCOUNTER — Telehealth: Payer: Self-pay

## 2020-09-24 ENCOUNTER — Encounter (HOSPITAL_COMMUNITY): Payer: Self-pay

## 2020-09-24 DIAGNOSIS — Z483 Aftercare following surgery for neoplasm: Secondary | ICD-10-CM | POA: Diagnosis not present

## 2020-09-24 DIAGNOSIS — R251 Tremor, unspecified: Secondary | ICD-10-CM | POA: Diagnosis not present

## 2020-09-24 DIAGNOSIS — G40909 Epilepsy, unspecified, not intractable, without status epilepticus: Secondary | ICD-10-CM | POA: Diagnosis not present

## 2020-09-24 DIAGNOSIS — G8192 Hemiplegia, unspecified affecting left dominant side: Secondary | ICD-10-CM | POA: Diagnosis not present

## 2020-09-24 DIAGNOSIS — R270 Ataxia, unspecified: Secondary | ICD-10-CM | POA: Diagnosis not present

## 2020-09-24 DIAGNOSIS — C711 Malignant neoplasm of frontal lobe: Secondary | ICD-10-CM | POA: Diagnosis not present

## 2020-09-24 NOTE — Telephone Encounter (Signed)
[facility] hospital notes reviewed per protocol. A verbal okay was given for in- home physical therapy. Service to be once a week for 9 weeks. For increasing strength, balance, safety and mobility.   Mariella Saa Physical Therapist with [facility]. Call back phone [phone]).

## 2020-09-24 NOTE — Telephone Encounter (Signed)
Per protocol [name] chart was reviewed for home health services.  A verbal order was given to [facility])  for  in-home Occupational Therapy. Service to be once a week for 4 weeks.   Call back ph# [phone].

## 2020-09-25 ENCOUNTER — Other Ambulatory Visit: Payer: Self-pay | Admitting: Radiation Therapy

## 2020-09-25 DIAGNOSIS — G40909 Epilepsy, unspecified, not intractable, without status epilepticus: Secondary | ICD-10-CM

## 2020-09-25 DIAGNOSIS — G4700 Insomnia, unspecified: Secondary | ICD-10-CM

## 2020-09-25 NOTE — Progress Notes (Signed)
Location/Histology of Brain Tumor: Right Frontal  Patient presented with left sided motor seizures and difficulty using her left arm and leg as well as cognitive decline.  MRI Brain 09/06/2020: Expected postoperative changes status post gross total resection of parasagittal right frontal mass.  MRI Brain 08/30/2020: Partially cystic mass of the superior paramedian right hemisphere, measuring 2.9 x 1.5 cm with nodular peripheral contrast enhancement at its superior aspect. Moderate edema in the adjacent brain.  CT Head 08/30/2020: Right frontal parafalcine necrotic mass or abscess with associated mild mass effect and edema on the right frontal lobe. Further characterization with MRI without and with contrast is Recommended.  No acute intracranial hemorrhage.  Past or anticipated interventions, if any, per neurosurgery:  Dr. Marcello Moores -Right frontal tumor resection 09/05/2020   Past or anticipated interventions, if any, per medical oncology:  Dr. Mickeal Skinner 09/26/2020    Dose of Decadron, if applicable: No  Recent neurologic symptoms, if any:   Seizures: No  Headaches: No  Nausea: No  Dizziness/ataxia: A little wobbly occasionally.  Uses a walker at home.  Difficulty with hand coordination:  No  Focal numbness/weakness: Continues to have left side weakness/numbness mainly foot and hand.  Visual deficits/changes: No  Confusion/Memory deficits: No   SAFETY ISSUES:  Prior radiation? No  Pacemaker/ICD? No  Possible current pregnancy? Postmenopausal  Is the patient on methotrexate? no  Additional Complaints / other details:

## 2020-09-26 ENCOUNTER — Other Ambulatory Visit: Payer: Self-pay

## 2020-09-26 ENCOUNTER — Ambulatory Visit
Admission: RE | Admit: 2020-09-26 | Discharge: 2020-09-26 | Disposition: A | Discharge status: Home / Self Care | Payer: Medicare Other | Source: Ambulatory Visit | Attending: Radiation Oncology | Admitting: Radiation Oncology

## 2020-09-26 ENCOUNTER — Ambulatory Visit
Admit: 2020-09-26 | Discharge: 2020-09-26 | Disposition: A | Discharge status: Home / Self Care | Payer: Medicare Other | Attending: Radiation Oncology | Admitting: Radiation Oncology

## 2020-09-26 ENCOUNTER — Inpatient Hospital Stay (HOSPITAL_BASED_OUTPATIENT_CLINIC_OR_DEPARTMENT_OTHER): Payer: Medicare Other | Admitting: Internal Medicine

## 2020-09-26 ENCOUNTER — Telehealth: Payer: Self-pay | Admitting: Emergency Medicine

## 2020-09-26 VITALS — BP 123/75 | HR 63 | Temp 96.9°F | Resp 18 | Ht 66.0 in | Wt 125.1 lb

## 2020-09-26 VITALS — BP 117/71 | HR 70 | Temp 98.6°F | Resp 15 | Ht 66.0 in | Wt 125.9 lb

## 2020-09-26 DIAGNOSIS — C711 Malignant neoplasm of frontal lobe: Secondary | ICD-10-CM

## 2020-09-26 DIAGNOSIS — E785 Hyperlipidemia, unspecified: Secondary | ICD-10-CM | POA: Insufficient documentation

## 2020-09-26 DIAGNOSIS — I10 Essential (primary) hypertension: Secondary | ICD-10-CM | POA: Insufficient documentation

## 2020-09-26 DIAGNOSIS — Z79899 Other long term (current) drug therapy: Secondary | ICD-10-CM | POA: Diagnosis not present

## 2020-09-26 DIAGNOSIS — Z803 Family history of malignant neoplasm of breast: Secondary | ICD-10-CM

## 2020-09-26 DIAGNOSIS — E079 Disorder of thyroid, unspecified: Secondary | ICD-10-CM | POA: Insufficient documentation

## 2020-09-26 NOTE — Progress Notes (Signed)
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   HISTORY OF PRESENT ILLNESS: Darlene Marsh is a 70 y.o. female seen at the request of Dr. Marcello Marsh for a diagnosis of a primary brain tumor.  The patient originally presented with seizures and difficulty with function of her left arm and leg.  An MRI on 08/30/2020 showed a right frontal parafalcine necrotic mass with mild mass-effect and edema on the right frontal lobe.  An MRI that day showed a partially cystic mass in the superior right brain measuring 2.9 cm with nodular enhancement.  She underwent craniotomy with resection of the tumor on 09/05/2020 with Dr. Marcello Marsh, final pathology reveals glioblastoma, and a postop MRI showed expected postoperative changes with gross total resection.  She is seen today after meeting with Dr. Mickeal Marsh to discuss chemoradiation.     PREVIOUS RADIATION THERAPY: No   PAST MEDICAL HISTORY:  Past Medical History:  Diagnosis Date  . Hyperlipidemia   . Hypertension   . Thyroid disease    underactive  . Tremors of nervous system        PAST SURGICAL HISTORY: Past Surgical History:  Procedure Laterality Date  . APPLICATION OF CRANIAL NAVIGATION Right 09/05/2020   Procedure: APPLICATION OF CRANIAL NAVIGATION;  Surgeon: Darlene Mare, MD;  Location: [facility];  Service: Neurosurgery;  Laterality: Right;  . CRANIOTOMY Right 09/05/2020   Procedure: Craniotomy - right Frontal interhemispheric approach for tumor resection with BRAIN LAB;  Surgeon: Darlene Mare, MD;  Location: [facility];  Service: Neurosurgery;  Laterality: Right;     FAMILY HISTORY:  Family History  Problem Relation Age of Onset  . Cancer Mother        Breast  cancer  . Cancer Sister        Breast  . Cancer Sister        Breast     SOCIAL HISTORY:  reports that she has never smoked. She has never used smokeless tobacco. She reports that she does not drink alcohol and does not use drugs. The patient is married and lives in [place]. She is accompanied by her daughter Darlene Marsh.   ALLERGIES: Patient has no known allergies.   MEDICATIONS:  Current Outpatient Medications  Medication Sig Dispense Refill  . acetaminophen-codeine (TYLENOL #3) 300-30 MG tablet Take 1 tablet by mouth every 12 (twelve) hours as needed for severe pain. 14 tablet 0  . levETIRAcetam (KEPPRA) 500 MG tablet Take 1 tablet (500 mg total) by mouth 2 (two) times daily. 60 tablet 0   No current facility-administered medications for this encounter.   REVIEW OF SYSTEMS: On review of systems, the patient reports that she is doing well. She is not having difficulty at this time of any significant arm or leg weakness. She has had some feelings of being off balance, but no other complaints are noted. Her incision line is healing and is hidden in her hairline. No other complaints are noted.  PHYSICAL EXAM:  Wt Readings from Last 3 Encounters:  09/26/20 125 lb 2 oz (56.8 kg)  09/26/20 125 lb 14.4 oz (57.1 kg)  09/07/20 127 lb 3.3 oz (57.7 kg)   Temp Readings from Last  3 Encounters:  09/26/20 (!) 96.9 F (36.1 C) (Temporal)  09/26/20 98.6 F (37 C) (Tympanic)  09/20/20 98.4 F (36.9 C) (Oral)   BP Readings from Last 3 Encounters:  09/26/20 123/75  09/26/20 117/71  09/20/20 130/70   Pulse Readings from Last 3 Encounters:  09/26/20 63  09/26/20 70  09/20/20 62   In general this is a well appearing caucasian female in no acute distress. She's alert and oriented x4 and appropriate throughout the examination. Cardiopulmonary assessment is negative for acute distress and she exhibits normal effort.     ECOG = 1  0 - Asymptomatic (Fully active, able to carry on all  predisease activities without restriction)  1 - Symptomatic but completely ambulatory (Restricted in physically strenuous activity but ambulatory and able to carry out work of a light or sedentary nature. For example, light housework, office work)  2 - Symptomatic, <50% in bed during the day (Ambulatory and capable of all self care but unable to carry out any work activities. Up and about more than 50% of waking hours)  3 - Symptomatic, >50% in bed, but not bedbound (Capable of only limited self-care, confined to bed or chair 50% or more of waking hours)  4 - Bedbound (Completely disabled. Cannot carry on any self-care. Totally confined to bed or chair)  5 - Death   Darlene Marsh MM, Darlene Marsh, Darlene Marsh, et al. [phone]). "Toxicity and response criteria of the [facility]". [place] Oncol. 5 (6): 649-55    LABORATORY DATA:  Lab Results  Component Value Date   WBC 12.4 (H) 09/09/2020   HGB 14.8 09/09/2020   HCT 41.5 09/09/2020   MCV 93.9 09/09/2020   PLT 340 09/09/2020   Lab Results  Component Value Date   NA 135 09/09/2020   K 3.9 09/09/2020   CL 100 09/09/2020   CO2 27 09/09/2020   Lab Results  Component Value Date   ALT 14 09/07/2020   AST 15 09/07/2020   ALKPHOS 66 09/07/2020   BILITOT 1.1 09/07/2020      RADIOGRAPHY: CT Head Wo Contrast  Result Date: 08/30/2020 CLINICAL DATA:  70 year old female with seizure. EXAM: CT HEAD WITHOUT CONTRAST TECHNIQUE: Contiguous axial images were obtained from the base of the skull through the vertex without intravenous contrast. COMPARISON:  None. FINDINGS: Brain: The ventricles and sulci appropriate size for patient's age. Mild periventricular and deep white matter chronic microvascular ischemic changes noted. There is no acute intracranial hemorrhage. There is a 1.8 x 3.0 cm low attenuating lesion abutting the right anterior falx with associated mild mass effect on the right frontal lobe and mild vasogenic edema. This  may represent a necrotic mass, or an abscess. Further characterization with MRI without and with contrast is recommended. No midline shift. Vascular: No hyperdense vessel or unexpected calcification. Skull: Normal. Negative for fracture or focal lesion. Sinuses/Orbits: No acute finding. Other: None IMPRESSION: 1. Right frontal parafalcine necrotic mass or abscess with associated mild mass effect and edema on the right frontal lobe. Further characterization with MRI without and with contrast is recommended. 2. No acute intracranial hemorrhage. Electronically Signed   By: Anner Crete M.D.   On: 08/30/2020 18:33   MR BRAIN W WO CONTRAST  Result Date: 09/06/2020 CLINICAL DATA:  Intracranial mass post resection EXAM: MRI HEAD WITHOUT AND WITH CONTRAST TECHNIQUE: Multiplanar, multiecho pulse sequences of the brain and surrounding structures were obtained without and with intravenous contrast. CONTRAST:  5.90mL GADAVIST GADOBUTROL 1  MMOL/ML IV SOLN COMPARISON:  Preoperative MRI 08/30/2020 FINDINGS: Brain: New postoperative changes are present including extra-axial fluid and air underlying the right frontal craniotomy. There is a right parasagittal frontal resection cavity containing fluid, blood products, and minimal air at the location of the mass on the prior study. There is no residual nodular enhancement. Minimal reduced diffusion at the surgical cavity margins. Similar adjacent T2 FLAIR hyperintensity. Mass effect is mild. No evidence of acute infarction or hemorrhage remote from the operative site. Vascular: Major vessel flow voids at the skull base are preserved. Skull and upper cervical spine: Normal marrow signal is preserved. Sinuses/Orbits: Paranasal sinuses are aerated. Orbits are unremarkable. Other: Sella is unremarkable.  Mastoid air cells are clear. IMPRESSION: Expected postoperative changes status post gross total resection of parasagittal right frontal mass. Electronically Signed   By: Macy Mis M.D.   On: 09/06/2020 07:25   MR Brain W and Wo Contrast  Result Date: 08/30/2020 CLINICAL DATA:  Brain mass, pre-surgical planning EXAM: MRI HEAD WITHOUT AND WITH CONTRAST TECHNIQUE: Multiplanar, multiecho pulse sequences of the brain and surrounding structures were obtained without and with intravenous contrast. CONTRAST:  62mL GADAVIST GADOBUTROL 1 MMOL/ML IV SOLN COMPARISON:  head CT 08/30/2020 FINDINGS: Brain: Partially cystic mass at the superior right hemisphere, adjacent to the falx cerebri, measuring 2.9 x 1.5 cm. Moderate hyperintense T2-weighted signal within the adjacent brain. There is nodular contrast enhancement at the superior periphery of the lesion. No abnormal diffusion restriction. Otherwise, white matter signal is normal. Vascular: Major flow voids are preserved. Skull and upper cervical spine: Normal calvarium and skull base. Visualized upper cervical spine and soft tissues are normal. Sinuses/Orbits:No paranasal sinus fluid levels or advanced mucosal thickening. No mastoid or middle ear effusion. Normal orbits. IMPRESSION: Partially cystic mass of the superior paramedian right hemisphere with nodular peripheral contrast enhancement at its superior aspect. Moderate edema in the adjacent brain. Electronically Signed   By: Ulyses Jarred M.D.   On: 08/30/2020 22:36       IMPRESSION/PLAN: 1. Glioblastoma of the frontal lobe. Dr. Lisbeth Renshaw discusses the pathology findings and reviews the nature of primary brain disease. He reviewed the standart treatment of adjuvant chemoradiation. We discussed the risks, benefits, short, and long term effects of radiotherapy, as well as the curative intent, and the patient is interested in proceeding. Dr. Lisbeth Renshaw discusses the delivery and logistics of radiotherapy and anticipates a course of 6 weeks of radiotherapy. Written consent is obtained and placed in the chart, a copy was provided to the patient. She will simulate on 10/01/20 at 9:15 am. We anticipate  her treatment to begin on 10/07/20.  In a visit lasting 45 minutes, greater than 50% of the time was spent face to face with Dr. Lisbeth Renshaw and myself (via Jackquline Denmark) discussing the patient's condition, in preparation for the discussion, and coordinating the patient's care.   The above documentation reflects my direct findings during this shared patient visit. Please see the separate note by Dr. Lisbeth Renshaw on this date for the remainder of the patient's plan of care.    Carola Rhine, [facility]   **Disclaimer: This note was dictated with voice recognition software. Similar sounding words can inadvertently be transcribed and this note may contain transcription errors which may not have been corrected upon publication of note.**

## 2020-09-26 NOTE — Telephone Encounter (Signed)
Trial: A Single Arm, Pilot Study of Ramipril for Preventing Radiation-Induced Cognitive Decline in Glioblastoma (GBM) Patients Receiving Brain Radiotherapy (WF 1801)  Patient Darlene Marsh was identified by MD Mickeal Skinner as a potential candidate for the above listed study.  This Clinical Research Nurse contacted Jasmynn Pfalzgraf via telephone (verified pt's identity), [phone], on 09/26/20 to discuss participation in the above listed research study.  Also on the line was patient's daughter Chimamanda Siegfried.  The study was reviewed via phone.  Pt agreed to receive copy of consent form and copy of HIPAA form via email (verified email address on file) for review over the weekend.  Patient reads, speaks, and understands [place].   Patient was provided with the contact information of this Research Nurse in the email as well and encouraged to contact the research team with any questions.  Pt agreed to meet with Research Nurse on Tuesday (10/01/20) after her radiation appts to go over consent forms in more detail.  Will discuss potential DCP-001 enrollment once we have discussed interest in WF 1801 study.  Wells Guiles '[place], RN, BSN Clinical Research Nurse 09/26/20, 3:25 PM

## 2020-09-26 NOTE — Progress Notes (Signed)
[place] at [facility], [place] [address] [phone]   New Patient Evaluation  Date of Service: 09/26/20 Patient Name: Darlene Marsh Patient MRN: [id number] Patient DOB: [birth year] Provider: Ventura Sellers, MD  Identifying Statement:  Darlene Marsh is a 70 y.o. female with right frontal glioblastoma who presents for initial consultation and evaluation.    Referring Provider: Vallarie Mare, MD [address] [place],  [place] [address]  Oncologic History: Oncology History  Glioblastoma multiforme of frontal lobe [facility])  09/05/2020 Surgery   Craniotomy, resection with Dr. Marcello Moores; path demonstrates Glioblastoma IDH-wt     Biomarkers:  MGMT Unknown.  IDH 1/2 Wild type.  EGFR Unknown  TERT Unknown   History of Present Illness: The patient's records from the referring physician were obtained and reviewed and the patient interviewed to confirm this HPI.  Darlene Marsh presented to medical attention earlier this month with new onset seizure episode, characterized as "left arm shaking and tremoring, with some involvement of the leg".  Prior to this she had no complaints, and was independent.  CNS imaging demonstrated nodularly enhancing right frontal mass.  She underwent craniotomy, resection with Dr. Marcello Moores on 09/05/20; path demonstrated glioblastoma IDH-wt.  Following surgery, she experienced some left sided weakness and was discharged for inpatient rehab course.  After discharge this past week, she is close to her baseline, still using a walker for some ambulation.  Otherwise no additional seizures, no headaches.  Medications: Current Outpatient Medications on File Prior to Visit  Medication Sig Dispense Refill  . acetaminophen-codeine (TYLENOL #3) 300-30 MG tablet Take 1 tablet by mouth every 12 (twelve) hours as needed for severe pain. 14 tablet 0  . levETIRAcetam (KEPPRA) 500 MG tablet Take 1 tablet (500 mg total) by  mouth 2 (two) times daily. 60 tablet 0  . melatonin 5 MG TABS Take 1 tablet (5 mg total) by mouth daily after supper. 30 tablet 0   No current facility-administered medications on file prior to visit.    Allergies: No Known Allergies Past Medical History:  Past Medical History:  Diagnosis Date  . Hyperlipidemia   . Hypertension   . Thyroid disease    underactive  . Tremors of nervous system    Past Surgical History:  Past Surgical History:  Procedure Laterality Date  . APPLICATION OF CRANIAL NAVIGATION Right 09/05/2020   Procedure: APPLICATION OF CRANIAL NAVIGATION;  Surgeon: Vallarie Mare, MD;  Location: [facility];  Service: Neurosurgery;  Laterality: Right;  . CRANIOTOMY Right 09/05/2020   Procedure: Craniotomy - right Frontal interhemispheric approach for tumor resection with BRAIN LAB;  Surgeon: Vallarie Mare, MD;  Location: [facility];  Service: Neurosurgery;  Laterality: Right;   Social History:  Social History   Socioeconomic History  . Marital status: Married    Spouse name: Not on file  . Number of children: Not on file  . Years of education: Not on file  . Highest education level: Not on file  Occupational History  . Not on file  Tobacco Use  . Smoking status: Never Smoker  . Smokeless tobacco: Never Used  Vaping Use  . Vaping Use: Never used  Substance and Sexual Activity  . Alcohol use: Never  . Drug use: Never  . Sexual activity: Not on file  Other Topics Concern  . Not on file  Social History Narrative  . Not on file   Social Determinants of Health  Financial Resource Strain: Not on file  Food Insecurity: Not on file  Transportation Needs: Not on file  Physical Activity: Not on file  Stress: Not on file  Social Connections: Not on file  Intimate Partner Violence: Not on file   Family History:  Family History  Problem Relation Age of Onset  . Cancer Mother        Breast cancer  . Cancer Sister        Breast  . Cancer Sister        Breast     Review of Systems: Constitutional: Doesn't report fevers, chills or abnormal weight loss Eyes: Doesn't report blurriness of vision Ears, nose, mouth, throat, and face: Doesn't report sore throat Respiratory: Doesn't report cough, dyspnea or wheezes Cardiovascular: Doesn't report palpitation, chest discomfort  Gastrointestinal:  Doesn't report nausea, constipation, diarrhea GU: Doesn't report incontinence Skin: Doesn't report skin rashes Neurological: Per HPI Musculoskeletal: Doesn't report joint pain Behavioral/Psych: Doesn't report anxiety  Physical Exam: Vitals:   09/26/20 0937  BP: 117/71  Pulse: 70  Resp: 15  Temp: 98.6 F (37 C)  SpO2: 100%   KPS: 80. General: Alert, cooperative, pleasant, in no acute distress Head: Normal EENT: No conjunctival injection or scleral icterus.  Lungs: Resp effort normal Cardiac: Regular rate Abdomen: Non-distended abdomen Skin: No rashes cyanosis or petechiae. Extremities: No clubbing or edema  Neurologic Exam: Mental Status: Awake, alert, attentive to examiner. Oriented to self and environment. Language is fluent with intact comprehension.  Cranial Nerves: Visual acuity is grossly normal. Visual fields are full. Extra-ocular movements intact. No ptosis. Face is symmetric Motor: Tone and bulk are normal. Subtle left arm and leg weakness. Reflexes are symmetric, no pathologic reflexes present.  Sensory: Intact to light touch Gait: Mild dystaxia   Labs: I have reviewed the data as listed    Component Value Date/Time   NA 135 09/09/2020 1036   K 3.9 09/09/2020 1036   CL 100 09/09/2020 1036   CO2 27 09/09/2020 1036   GLUCOSE 138 (H) 09/09/2020 1036   BUN 18 09/09/2020 1036   CREATININE 0.68 09/09/2020 1036   CREATININE 0.74 03/15/2019 0817   CALCIUM 9.1 09/09/2020 1036   PROT 6.2 (L) 09/07/2020 1912   ALBUMIN 2.9 (L) 09/07/2020 1912   AST 15 09/07/2020 1912   ALT 14 09/07/2020 1912   ALKPHOS 66 09/07/2020 1912   BILITOT  1.1 09/07/2020 1912   GFRNONAA >60 09/09/2020 1036   GFRNONAA 75 01/19/2018 0939   GFRAA 87 01/19/2018 0939   Lab Results  Component Value Date   WBC 12.4 (H) 09/09/2020   NEUTROABS 10.9 (H) 09/09/2020   HGB 14.8 09/09/2020   HCT 41.5 09/09/2020   MCV 93.9 09/09/2020   PLT 340 09/09/2020    Imaging:  CT Head Wo Contrast  Result Date: 08/30/2020 CLINICAL DATA:  70 year old female with seizure. EXAM: CT HEAD WITHOUT CONTRAST TECHNIQUE: Contiguous axial images were obtained from the base of the skull through the vertex without intravenous contrast. COMPARISON:  None. FINDINGS: Brain: The ventricles and sulci appropriate size for patient's age. Mild periventricular and deep white matter chronic microvascular ischemic changes noted. There is no acute intracranial hemorrhage. There is a 1.8 x 3.0 cm low attenuating lesion abutting the right anterior falx with associated mild mass effect on the right frontal lobe and mild vasogenic edema. This may represent a necrotic mass, or an abscess. Further characterization with MRI without and with contrast is recommended. No midline shift. Vascular: No hyperdense  vessel or unexpected calcification. Skull: Normal. Negative for fracture or focal lesion. Sinuses/Orbits: No acute finding. Other: None IMPRESSION: 1. Right frontal parafalcine necrotic mass or abscess with associated mild mass effect and edema on the right frontal lobe. Further characterization with MRI without and with contrast is recommended. 2. No acute intracranial hemorrhage. Electronically Signed   By: Anner Crete M.D.   On: 08/30/2020 18:33   MR BRAIN W WO CONTRAST  Result Date: 09/06/2020 CLINICAL DATA:  Intracranial mass post resection EXAM: MRI HEAD WITHOUT AND WITH CONTRAST TECHNIQUE: Multiplanar, multiecho pulse sequences of the brain and surrounding structures were obtained without and with intravenous contrast. CONTRAST:  5.43m GADAVIST GADOBUTROL 1 MMOL/ML IV SOLN COMPARISON:   Preoperative MRI 08/30/2020 FINDINGS: Brain: New postoperative changes are present including extra-axial fluid and air underlying the right frontal craniotomy. There is a right parasagittal frontal resection cavity containing fluid, blood products, and minimal air at the location of the mass on the prior study. There is no residual nodular enhancement. Minimal reduced diffusion at the surgical cavity margins. Similar adjacent T2 FLAIR hyperintensity. Mass effect is mild. No evidence of acute infarction or hemorrhage remote from the operative site. Vascular: Major vessel flow voids at the skull base are preserved. Skull and upper cervical spine: Normal marrow signal is preserved. Sinuses/Orbits: Paranasal sinuses are aerated. Orbits are unremarkable. Other: Sella is unremarkable.  Mastoid air cells are clear. IMPRESSION: Expected postoperative changes status post gross total resection of parasagittal right frontal mass. Electronically Signed   By: PMacy MisM.D.   On: 09/06/2020 07:25   MR Brain W and Wo Contrast  Result Date: 08/30/2020 CLINICAL DATA:  Brain mass, pre-surgical planning EXAM: MRI HEAD WITHOUT AND WITH CONTRAST TECHNIQUE: Multiplanar, multiecho pulse sequences of the brain and surrounding structures were obtained without and with intravenous contrast. CONTRAST:  637mGADAVIST GADOBUTROL 1 MMOL/ML IV SOLN COMPARISON:  head CT 08/30/2020 FINDINGS: Brain: Partially cystic mass at the superior right hemisphere, adjacent to the falx cerebri, measuring 2.9 x 1.5 cm. Moderate hyperintense T2-weighted signal within the adjacent brain. There is nodular contrast enhancement at the superior periphery of the lesion. No abnormal diffusion restriction. Otherwise, white matter signal is normal. Vascular: Major flow voids are preserved. Skull and upper cervical spine: Normal calvarium and skull base. Visualized upper cervical spine and soft tissues are normal. Sinuses/Orbits:No paranasal sinus fluid levels or  advanced mucosal thickening. No mastoid or middle ear effusion. Normal orbits. IMPRESSION: Partially cystic mass of the superior paramedian right hemisphere with nodular peripheral contrast enhancement at its superior aspect. Moderate edema in the adjacent brain. Electronically Signed   By: KeUlyses Jarred.D.   On: 08/30/2020 22:36    Pathology: SURGICAL PATHOLOGY  CASE: [id number]  [facility]   Clinical History: tumor (cm)   FINAL MICROSCOPIC DIAGNOSIS:   A. BRAIN TUMOR, RIGHT, RESECTION:  - Glioblastoma, IDH-wild-type, CNS WHO grade 4  - See comment   COMMENT:   The above diagnosis was [facility]  [facility], 2020.   Molecular information (immunohistochemistry): IDH1 R132H is negative;  ATRX is retained.   Please see electronic medical record for a scanned copy of the complete  report.    INTRAOPERATIVE DIAGNOSIS:   A. Right brain tumor: "Lesional tissue obtained"  Intraoperative diagnosis rendered by Dr. CaJeannie Donet 3:35 PM on 09/05/2020.    Assessment/Plan Glioblastoma multiforme of frontal lobe ([facility][C71.1]  We appreciate the opportunity to  participate in the care of Izabel Chim.  She is clinically stable today, now three weeks removed from her craniotomy with Dr. Marcello Moores.  Only very mild motor deficits persist at this time.  We had an extensive conversation with her and her family regarding pathology, prognosis, and available treatment pathways for glioblastoma.  Despite aggressive nature and overall limitations of treatment options, we are encouraged by her right frontal tumor location, good quality resection, strong performance status.  Presentation with seizure rather than focal deficit is also a positive prognostic sign.    We ultimately recommended proceeding with course of intensity modulated radiation therapy and concurrent daily Temozolomide.  Radiation will be administered Mon-Fri  over 6 weeks, Temodar will be dosed at 55m/m2 to be given daily over 42 days.  We reviewed side effects of temodar, including fatigue, nausea/vomiting, constipation, and cytopenias.  Informed consent was verbally obtained at bedside to proceed with oral chemotherapy.  Chemotherapy should be held for the following:  ANC less than 1,000  Platelets less than 100,000  LFT or creatinine greater than 2x ULN  If clinical concerns/contraindications develop  Every 2 weeks during radiation, labs will be checked accompanied by a clinical evaluation in the brain tumor clinic.  Will continue Keppra 5044mBID.  Screening for potential clinical trials was performed and discussed using eligibility criteria for active protocols at [facility], as well as national database available on [email or web address]   The patient is a candidate and is interested in pursuing a research protocol- Ramipril radiation study.  Referral will be placed for our research team to reach out to her for further protocol information.    We spent twenty additional minutes teaching regarding the natural history, biology, and historical experience in the treatment of brain tumors. We then discussed in detail the current recommendations for therapy focusing on the mode of administration, mechanism of action, anticipated toxicities, and quality of life issues associated with this plan. We also provided teaching sheets for the patient to take home as an additional resource.  All questions were answered. The patient knows to call the clinic with any problems, questions or concerns. No barriers to learning were detected.  The total time spent in the encounter was 60 minutes and more than 50% was on counseling and review of test results   ZaVentura SellersMD Medical Director of Neuro-Oncology [facility] We[facility]ometown4/28/22 9:34 AM

## 2020-09-27 ENCOUNTER — Telehealth: Payer: Self-pay | Admitting: Emergency Medicine

## 2020-09-27 NOTE — Telephone Encounter (Signed)
Called patient back to cancel lab appt for 0830 on 10/01/20.  Labs not required at this time.  Pt's spouse verbalized understanding.  Appt cancelled.  Wells Guiles '[place], RN, BSN Clinical Research Nurse 09/27/20, 10:38 AM

## 2020-09-27 NOTE — Telephone Encounter (Signed)
Contacted patient to request that she come in at 0830 instead of 0845 on Tuesday 10/01/2020 so she can have labs drawn that would be needed for potential enrollment in WF 1801 study.  Spoke with patient's spouse Arnell Sieving who agreed to appt add on and confirms new appt date/time.  Appt created.  Wells Guiles '[place], RN, BSN Clinical Research Nurse 09/27/20, 10:22 AM

## 2020-09-30 ENCOUNTER — Telehealth: Payer: Self-pay | Admitting: Emergency Medicine

## 2020-09-30 ENCOUNTER — Inpatient Hospital Stay: Payer: Medicare Other

## 2020-09-30 DIAGNOSIS — R270 Ataxia, unspecified: Secondary | ICD-10-CM | POA: Diagnosis not present

## 2020-09-30 DIAGNOSIS — G40909 Epilepsy, unspecified, not intractable, without status epilepticus: Secondary | ICD-10-CM | POA: Diagnosis not present

## 2020-09-30 DIAGNOSIS — C711 Malignant neoplasm of frontal lobe: Secondary | ICD-10-CM | POA: Diagnosis not present

## 2020-09-30 DIAGNOSIS — R251 Tremor, unspecified: Secondary | ICD-10-CM | POA: Diagnosis not present

## 2020-09-30 DIAGNOSIS — Z483 Aftercare following surgery for neoplasm: Secondary | ICD-10-CM | POA: Diagnosis not present

## 2020-09-30 DIAGNOSIS — G8192 Hemiplegia, unspecified affecting left dominant side: Secondary | ICD-10-CM | POA: Diagnosis not present

## 2020-09-30 NOTE — Telephone Encounter (Addendum)
Received follow up email from pt/her daughter [name] regarding [facility] enrollment.  Pt declined to participate in WF 1801 study.  MD [place] office made aware.  Pt denies any further questions/concerns at this time.  DCP-001 consents sent via email for review to see if pt is interested in enrollment for that study.  Will attempt to f/u with patient about DCP interest on 10/01/20 at same time as originally scheduled WF 1801 consent discussion visit.  Wells Guiles '[place], RN, BSN Clinical Research Nurse I 09/30/20  2:18 PM

## 2020-09-30 NOTE — Progress Notes (Signed)
Has armband been applied?  Yes  Does patient have an allergy to IV contrast dye?: No   Has patient ever received premedication for IV contrast dye?: n/a  Does patient take metformin?: No  If patient does take metformin when was the last dose: n/a  Date of lab work: 09/09/2020 BUN: 18 CR: 0.68 eGFR: >60  IV site: RAC  Has IV site been added to flowsheet?  Yes  BP 112/64   Pulse 68   Temp (!) 97 F (36.1 C)   Resp 18   SpO2 100%    Krishika Bugge M. Leonie Green, BSN

## 2020-10-01 ENCOUNTER — Inpatient Hospital Stay: Payer: Medicare Other | Admitting: Emergency Medicine

## 2020-10-01 ENCOUNTER — Ambulatory Visit
Admission: RE | Admit: 2020-10-01 | Discharge: 2020-10-01 | Disposition: A | Discharge status: Home / Self Care | Payer: Medicare Other | Source: Ambulatory Visit | Attending: Radiation Oncology | Admitting: Radiation Oncology

## 2020-10-01 ENCOUNTER — Other Ambulatory Visit: Payer: Medicare Other

## 2020-10-01 ENCOUNTER — Other Ambulatory Visit: Payer: Self-pay

## 2020-10-01 VITALS — BP 112/64 | HR 68 | Temp 97.0°F | Resp 18

## 2020-10-01 DIAGNOSIS — Z79899 Other long term (current) drug therapy: Secondary | ICD-10-CM | POA: Diagnosis not present

## 2020-10-01 DIAGNOSIS — C711 Malignant neoplasm of frontal lobe: Secondary | ICD-10-CM

## 2020-10-01 DIAGNOSIS — Z510 Encounter for antineoplastic radiation therapy: Secondary | ICD-10-CM | POA: Insufficient documentation

## 2020-10-01 MED ORDER — SODIUM CHLORIDE 0.9% FLUSH
10.0000 mL | Freq: Once | INTRAVENOUS | Status: AC
  Administered 2020-10-01: 10 mL via INTRAVENOUS

## 2020-10-01 NOTE — Research (Signed)
Trial:  DCP-001: Use of a Clinical Trial Screening Tool to Address Cancer Health Disparities in the [place] [facility])  Patient Darlene Marsh was identified by this Research Nurse as a potential candidate for the above listed study.  This Clinical Research Nurse met with Letisha Yera, [phone], on 10/01/20 in a manner and location that ensures patient privacy to discuss participation in the above listed research study.  Patient is Accompanied by daughter [name] and spouse Arnell Sieving.  A copy of the informed consent document and separate HIPAA Authorization was provided to the patient via email yesterday.  Pt confirms receiving emailed copies of consent forms.  Patient reads, speaks, and understands [place].   Patient was provided with the business card of this Nurse and encouraged to contact the research team with any questions.  Approximately 10 minutes were spent with the patient reviewing the informed consent documents.  Patient has been provided with electronic copies of informed consent documents, was encouraged to review at their convenience with their support network, including other care providers.  Pt states she will review DCP study consents with family.  Will call pt in next few days to f/u on potential interest.  Pt denies any questions/concerns at this time.  Wells Guiles '[place], RN, BSN Clinical Research Nurse I 10/01/20 9:52 AM

## 2020-10-02 ENCOUNTER — Telehealth: Payer: Self-pay | Admitting: Internal Medicine

## 2020-10-02 ENCOUNTER — Telehealth: Payer: Self-pay | Admitting: *Deleted

## 2020-10-02 DIAGNOSIS — C711 Malignant neoplasm of frontal lobe: Secondary | ICD-10-CM | POA: Diagnosis not present

## 2020-10-02 DIAGNOSIS — R270 Ataxia, unspecified: Secondary | ICD-10-CM | POA: Diagnosis not present

## 2020-10-02 DIAGNOSIS — Z483 Aftercare following surgery for neoplasm: Secondary | ICD-10-CM | POA: Diagnosis not present

## 2020-10-02 DIAGNOSIS — G40909 Epilepsy, unspecified, not intractable, without status epilepticus: Secondary | ICD-10-CM | POA: Diagnosis not present

## 2020-10-02 DIAGNOSIS — R251 Tremor, unspecified: Secondary | ICD-10-CM | POA: Diagnosis not present

## 2020-10-02 DIAGNOSIS — Z79899 Other long term (current) drug therapy: Secondary | ICD-10-CM | POA: Diagnosis not present

## 2020-10-02 DIAGNOSIS — Z510 Encounter for antineoplastic radiation therapy: Secondary | ICD-10-CM | POA: Diagnosis not present

## 2020-10-02 DIAGNOSIS — G8192 Hemiplegia, unspecified affecting left dominant side: Secondary | ICD-10-CM | POA: Diagnosis not present

## 2020-10-02 NOTE — Telephone Encounter (Signed)
Received vm message from pt's daughter regarding pt's prescription for Temodar.  She is asking about when to expect to receive this.  Please advise.

## 2020-10-02 NOTE — Telephone Encounter (Signed)
Scheduled appts per 4/28 sch msg. Pt's daughter is aware.

## 2020-10-03 ENCOUNTER — Other Ambulatory Visit: Payer: Self-pay | Admitting: Internal Medicine

## 2020-10-03 ENCOUNTER — Telehealth: Payer: Self-pay | Admitting: Pharmacist

## 2020-10-03 ENCOUNTER — Telehealth: Payer: Self-pay

## 2020-10-03 ENCOUNTER — Other Ambulatory Visit (HOSPITAL_COMMUNITY): Payer: Self-pay

## 2020-10-03 DIAGNOSIS — C711 Malignant neoplasm of frontal lobe: Secondary | ICD-10-CM

## 2020-10-03 MED ORDER — TEMOZOLOMIDE 20 MG PO CAPS
20.0000 mg | ORAL_CAPSULE | Freq: Every day | ORAL | 0 refills | Status: DC
  Filled 2020-10-03: qty 28, 28d supply, fill #0
  Filled 2020-10-03: qty 42, 42d supply, fill #0
  Filled 2020-10-25: qty 14, 14d supply, fill #1

## 2020-10-03 MED ORDER — ONDANSETRON HCL 8 MG PO TABS
8.0000 mg | ORAL_TABLET | Freq: Two times a day (BID) | ORAL | 1 refills | Status: DC | PRN
  Filled 2020-10-03 (×3): qty 30, 15d supply, fill #0
  Filled 2020-10-25: qty 30, 15d supply, fill #1

## 2020-10-03 MED ORDER — TEMOZOLOMIDE 100 MG PO CAPS
100.0000 mg | ORAL_CAPSULE | Freq: Every day | ORAL | 0 refills | Status: DC
  Filled 2020-10-03: qty 28, 28d supply, fill #0
  Filled 2020-10-03: qty 45, 45d supply, fill #0
  Filled 2020-10-25: qty 14, 14d supply, fill #1

## 2020-10-03 NOTE — Progress Notes (Signed)
START ON PATHWAY REGIMEN - Neuro     One cycle, concurrent with RT:     Temozolomide   **Always confirm dose/schedule in your pharmacy ordering system**  Patient Characteristics: Glioblastoma (Grade 4 Glioma), Newly Diagnosed / Treatment Naive, Good Performance Status and/or Younger Patient, MGMT Promoter Unmethylated/Unknown Disease Classification: Glioma Disease Classification: Glioblastoma (Grade 4 Glioma) Disease Status: Newly Diagnosed / Treatment Naive Performance Status: Good Performance Status and/or Younger Patient MGMT Promoter Methylation Status: Awaiting Test Results Intent of Therapy: Non-Curative / Palliative Intent, Discussed with Patient

## 2020-10-03 NOTE — Telephone Encounter (Signed)
Oral Chemotherapy Pharmacist Encounter  I spoke with patient's daughter, Darlene Marsh, for overview of: Temodar for the treatment of glioblastoma multiforme in conjunction with radiation, planned duration concomitant phase 42 days of therapy.   Counseled on administration, dosing, side effects, monitoring, drug-food interactions, safe handling, storage, and disposal.  Patient will take Temodar 100mg  capsules and Temodar 20mg  capsules, 120 mg total daily dose, by mouth once daily, may take at bedtime and on an empty stomach to decrease nausea and vomiting.  Patient will take Temodar concurrent with radiation for 42 days straight.  Temodar start date: 10/06/20 PM Radiation start date: 10/07/20   Prophylactic Zofran will not be used at initiation of concurrent phase, but will be initiated if nausea develops despite Temodar administration on an empty stomach and at bedtime.    Adverse effects include but are not limited to: nausea, vomiting, anorexia, GI upset, rash, drug fever, and fatigue. Rare but serious adverse effects of pneumocystis pneumonia and secondary malignancy also discussed.  PCP prophylaxis will not be initiated at this time, but may be added based on lymphocyte count in the future.  Reviewed importance of keeping a medication schedule and plan for any missed doses. No barriers to medication adherence identified.  Medication reconciliation performed and medication/allergy list updated.  Insurance authorization for Temodar has been obtained. Patient's daughter will pick Temodar up from the [facility] on 10/04/20 after 2PM.  Daughter informed the pharmacy will reach out 5-7 days prior to needing next fill of Temodar to coordinate continued medication acquisition to prevent break in therapy.   All questions answered.  Patient's daughter voiced understanding and appreciation.   Medication education handout placed in mail for patient and family. Patient's daughter  knows to call the office with questions or concerns. Oral Chemotherapy Clinic phone number provided to [facility].   Leron Croak, PharmD, BCPS Hematology/Oncology Clinical Pharmacist [facility] [phone] 10/03/2020 2:37 PM

## 2020-10-03 NOTE — Telephone Encounter (Signed)
Oral Oncology Pharmacist Encounter  Received new prescription for Temodar (temozolomide) for the adjuvant treatment of glioblastoma in conjunction with radiation, planned duration 42 days.  Prescription dose and frequency assessed for appropriateness. Appropriate for therapy initiation.   BMP and CBC w/ Diff from 09/09/20 assessed, labs stable for treatment initiation.  Current medication list in Epic reviewed, no relevant/significant DDIs with Temodar identified.  Evaluated chart and no patient barriers to medication adherence noted.   Patient agreement for treatment documented in MD note on 09/26/20.  Prescription has been e-scribed to the [facility] for benefits analysis and approval.  Oral Oncology Clinic will continue to follow for insurance authorization, copayment issues, initial counseling and start date.  Leron Croak, PharmD, BCPS Hematology/Oncology Clinical Pharmacist [facility] [phone] 10/03/2020 11:26 AM

## 2020-10-03 NOTE — Telephone Encounter (Signed)
Oral Oncology Patient Advocate Encounter  After completing a benefits investigation, prior authorization for Temodar is not required at this time through Medicare B.  Patient's copay: $41.40 for #30 of 100mg .        $12.12 for #30 of 20mg   [place] at [place] [facility] [place] Phone [phone] Fax [phone] 10/03/2020 12:56 PM

## 2020-10-04 ENCOUNTER — Other Ambulatory Visit (HOSPITAL_COMMUNITY): Payer: Self-pay

## 2020-10-04 ENCOUNTER — Telehealth: Payer: Self-pay | Admitting: Emergency Medicine

## 2020-10-04 NOTE — Telephone Encounter (Signed)
DCP-001: Use of a Clinical Trial Screening Tool to Address Cancer Health Disparities in the [facility])  Called patient to f/u on potential interest in DCP study.  No answer.  Contacted pt's daugher [name] (ROI on file) who states pt has declined participation in DCP-001 study.  Christine thanked Research for their time and attention.  Both Christine and patient were thanked for their time and participation.  [name] denies any questions/concerns at this time but is aware to contact Research as needed.  Wells Guiles '[place], RN, BSN Clinical Research Nurse I 10/04/20 3:29 PM

## 2020-10-07 ENCOUNTER — Other Ambulatory Visit: Payer: Self-pay

## 2020-10-07 ENCOUNTER — Ambulatory Visit
Admission: RE | Admit: 2020-10-07 | Discharge: 2020-10-07 | Disposition: A | Discharge status: Home / Self Care | Payer: Medicare Other | Source: Ambulatory Visit | Attending: Radiation Oncology | Admitting: Radiation Oncology

## 2020-10-07 DIAGNOSIS — Z510 Encounter for antineoplastic radiation therapy: Secondary | ICD-10-CM | POA: Diagnosis not present

## 2020-10-07 DIAGNOSIS — Z79899 Other long term (current) drug therapy: Secondary | ICD-10-CM | POA: Diagnosis not present

## 2020-10-07 DIAGNOSIS — C711 Malignant neoplasm of frontal lobe: Secondary | ICD-10-CM | POA: Diagnosis not present

## 2020-10-08 ENCOUNTER — Ambulatory Visit
Admission: RE | Admit: 2020-10-08 | Discharge: 2020-10-08 | Disposition: A | Discharge status: Home / Self Care | Payer: Medicare Other | Source: Ambulatory Visit | Attending: Radiation Oncology | Admitting: Radiation Oncology

## 2020-10-08 ENCOUNTER — Other Ambulatory Visit (HOSPITAL_COMMUNITY): Payer: Self-pay

## 2020-10-08 ENCOUNTER — Inpatient Hospital Stay: Payer: Medicare Other | Admitting: Physical Medicine and Rehabilitation

## 2020-10-08 DIAGNOSIS — C711 Malignant neoplasm of frontal lobe: Secondary | ICD-10-CM | POA: Diagnosis not present

## 2020-10-08 DIAGNOSIS — Z79899 Other long term (current) drug therapy: Secondary | ICD-10-CM | POA: Diagnosis not present

## 2020-10-08 DIAGNOSIS — Z510 Encounter for antineoplastic radiation therapy: Secondary | ICD-10-CM | POA: Diagnosis not present

## 2020-10-09 ENCOUNTER — Ambulatory Visit
Admission: RE | Admit: 2020-10-09 | Discharge: 2020-10-09 | Disposition: A | Discharge status: Home / Self Care | Payer: Medicare Other | Source: Ambulatory Visit | Attending: Radiation Oncology | Admitting: Radiation Oncology

## 2020-10-09 ENCOUNTER — Other Ambulatory Visit: Payer: Self-pay

## 2020-10-09 DIAGNOSIS — Z483 Aftercare following surgery for neoplasm: Secondary | ICD-10-CM | POA: Diagnosis not present

## 2020-10-09 DIAGNOSIS — R32 Unspecified urinary incontinence: Secondary | ICD-10-CM | POA: Diagnosis not present

## 2020-10-09 DIAGNOSIS — G4700 Insomnia, unspecified: Secondary | ICD-10-CM | POA: Diagnosis not present

## 2020-10-09 DIAGNOSIS — G8192 Hemiplegia, unspecified affecting left dominant side: Secondary | ICD-10-CM | POA: Diagnosis not present

## 2020-10-09 DIAGNOSIS — C711 Malignant neoplasm of frontal lobe: Secondary | ICD-10-CM | POA: Diagnosis not present

## 2020-10-09 DIAGNOSIS — R270 Ataxia, unspecified: Secondary | ICD-10-CM | POA: Diagnosis not present

## 2020-10-09 DIAGNOSIS — R251 Tremor, unspecified: Secondary | ICD-10-CM | POA: Diagnosis not present

## 2020-10-09 DIAGNOSIS — E039 Hypothyroidism, unspecified: Secondary | ICD-10-CM | POA: Diagnosis not present

## 2020-10-09 DIAGNOSIS — Z9181 History of falling: Secondary | ICD-10-CM | POA: Diagnosis not present

## 2020-10-09 DIAGNOSIS — I10 Essential (primary) hypertension: Secondary | ICD-10-CM | POA: Diagnosis not present

## 2020-10-09 DIAGNOSIS — E785 Hyperlipidemia, unspecified: Secondary | ICD-10-CM | POA: Diagnosis not present

## 2020-10-09 DIAGNOSIS — G40909 Epilepsy, unspecified, not intractable, without status epilepticus: Secondary | ICD-10-CM | POA: Diagnosis not present

## 2020-10-09 DIAGNOSIS — Z79899 Other long term (current) drug therapy: Secondary | ICD-10-CM | POA: Diagnosis not present

## 2020-10-09 DIAGNOSIS — Z510 Encounter for antineoplastic radiation therapy: Secondary | ICD-10-CM | POA: Diagnosis not present

## 2020-10-10 ENCOUNTER — Ambulatory Visit
Admission: RE | Admit: 2020-10-10 | Discharge: 2020-10-10 | Disposition: A | Discharge status: Home / Self Care | Payer: Medicare Other | Source: Ambulatory Visit | Attending: Radiation Oncology | Admitting: Radiation Oncology

## 2020-10-10 DIAGNOSIS — C711 Malignant neoplasm of frontal lobe: Secondary | ICD-10-CM | POA: Diagnosis not present

## 2020-10-10 DIAGNOSIS — Z79899 Other long term (current) drug therapy: Secondary | ICD-10-CM | POA: Diagnosis not present

## 2020-10-10 DIAGNOSIS — Z510 Encounter for antineoplastic radiation therapy: Secondary | ICD-10-CM | POA: Diagnosis not present

## 2020-10-10 NOTE — Progress Notes (Signed)
Pt here for patient teaching.  Pt given Radiation and You booklet, skin care instructions and Sonafine.  Reviewed areas of pertinence such as fatigue, hair loss, nausea and vomiting, skin changes, headache and blurry vision . Pt able to give teach back of to pat skin and use unscented/gentle soap,apply Sonafine bid and avoid applying anything to skin within 4 hours of treatment. Pt verbalizes understanding of information given and will contact nursing with any questions or concerns.     [email or web address]/[email or web address]/index

## 2020-10-11 ENCOUNTER — Ambulatory Visit
Admission: RE | Admit: 2020-10-11 | Discharge: 2020-10-11 | Disposition: A | Discharge status: Home / Self Care | Payer: Medicare Other | Source: Ambulatory Visit | Attending: Radiation Oncology | Admitting: Radiation Oncology

## 2020-10-11 ENCOUNTER — Other Ambulatory Visit: Payer: Self-pay

## 2020-10-11 DIAGNOSIS — Z510 Encounter for antineoplastic radiation therapy: Secondary | ICD-10-CM | POA: Diagnosis not present

## 2020-10-11 DIAGNOSIS — C711 Malignant neoplasm of frontal lobe: Secondary | ICD-10-CM

## 2020-10-11 DIAGNOSIS — Z79899 Other long term (current) drug therapy: Secondary | ICD-10-CM | POA: Diagnosis not present

## 2020-10-11 MED ORDER — SONAFINE EX EMUL
1.0000 "application " | Freq: Once | CUTANEOUS | Status: AC
  Administered 2020-10-11: 1 via TOPICAL

## 2020-10-14 ENCOUNTER — Ambulatory Visit
Admission: RE | Admit: 2020-10-14 | Discharge: 2020-10-14 | Disposition: A | Discharge status: Home / Self Care | Payer: Medicare Other | Source: Ambulatory Visit | Attending: Radiation Oncology | Admitting: Radiation Oncology

## 2020-10-14 DIAGNOSIS — Z79899 Other long term (current) drug therapy: Secondary | ICD-10-CM | POA: Diagnosis not present

## 2020-10-14 DIAGNOSIS — Z510 Encounter for antineoplastic radiation therapy: Secondary | ICD-10-CM | POA: Diagnosis not present

## 2020-10-14 DIAGNOSIS — C711 Malignant neoplasm of frontal lobe: Secondary | ICD-10-CM | POA: Diagnosis not present

## 2020-10-15 ENCOUNTER — Ambulatory Visit
Admission: RE | Admit: 2020-10-15 | Discharge: 2020-10-15 | Disposition: A | Discharge status: Home / Self Care | Payer: Medicare Other | Source: Ambulatory Visit | Attending: Radiation Oncology | Admitting: Radiation Oncology

## 2020-10-15 ENCOUNTER — Other Ambulatory Visit: Payer: Self-pay

## 2020-10-15 DIAGNOSIS — C711 Malignant neoplasm of frontal lobe: Secondary | ICD-10-CM | POA: Diagnosis not present

## 2020-10-15 DIAGNOSIS — Z510 Encounter for antineoplastic radiation therapy: Secondary | ICD-10-CM | POA: Diagnosis not present

## 2020-10-15 DIAGNOSIS — Z79899 Other long term (current) drug therapy: Secondary | ICD-10-CM | POA: Diagnosis not present

## 2020-10-16 ENCOUNTER — Ambulatory Visit
Admission: RE | Admit: 2020-10-16 | Discharge: 2020-10-16 | Disposition: A | Discharge status: Home / Self Care | Payer: Medicare Other | Source: Ambulatory Visit | Attending: Radiation Oncology | Admitting: Radiation Oncology

## 2020-10-16 DIAGNOSIS — Z510 Encounter for antineoplastic radiation therapy: Secondary | ICD-10-CM | POA: Diagnosis not present

## 2020-10-16 DIAGNOSIS — C711 Malignant neoplasm of frontal lobe: Secondary | ICD-10-CM | POA: Diagnosis not present

## 2020-10-16 DIAGNOSIS — Z79899 Other long term (current) drug therapy: Secondary | ICD-10-CM | POA: Diagnosis not present

## 2020-10-17 ENCOUNTER — Other Ambulatory Visit: Payer: Self-pay | Admitting: Physical Medicine and Rehabilitation

## 2020-10-17 ENCOUNTER — Inpatient Hospital Stay (HOSPITAL_BASED_OUTPATIENT_CLINIC_OR_DEPARTMENT_OTHER): Payer: Medicare Other | Admitting: Internal Medicine

## 2020-10-17 ENCOUNTER — Inpatient Hospital Stay: Payer: Medicare Other

## 2020-10-17 ENCOUNTER — Other Ambulatory Visit: Payer: Self-pay

## 2020-10-17 ENCOUNTER — Ambulatory Visit
Admission: RE | Admit: 2020-10-17 | Discharge: 2020-10-17 | Disposition: A | Discharge status: Home / Self Care | Payer: Medicare Other | Source: Ambulatory Visit | Attending: Radiation Oncology | Admitting: Radiation Oncology

## 2020-10-17 VITALS — BP 106/73 | HR 64 | Temp 97.6°F | Resp 16 | Ht 66.0 in | Wt 128.4 lb

## 2020-10-17 DIAGNOSIS — G40909 Epilepsy, unspecified, not intractable, without status epilepticus: Secondary | ICD-10-CM | POA: Diagnosis not present

## 2020-10-17 DIAGNOSIS — C711 Malignant neoplasm of frontal lobe: Secondary | ICD-10-CM | POA: Diagnosis not present

## 2020-10-17 DIAGNOSIS — Z510 Encounter for antineoplastic radiation therapy: Secondary | ICD-10-CM | POA: Diagnosis not present

## 2020-10-17 DIAGNOSIS — Z79899 Other long term (current) drug therapy: Secondary | ICD-10-CM | POA: Diagnosis not present

## 2020-10-17 LAB — CBC WITH DIFFERENTIAL (CANCER CENTER ONLY)
Abs Immature Granulocytes: 0.04 10*3/uL (ref 0.00–0.07)
Basophils Absolute: 0 10*3/uL (ref 0.0–0.1)
Basophils Relative: 1 %
Eosinophils Absolute: 0.1 10*3/uL (ref 0.0–0.5)
Eosinophils Relative: 1 %
HCT: 35.6 % — ABNORMAL LOW (ref 36.0–46.0)
Hemoglobin: 12.3 g/dL (ref 12.0–15.0)
Immature Granulocytes: 1 %
Lymphocytes Relative: 17 %
Lymphs Abs: 1.4 10*3/uL (ref 0.7–4.0)
MCH: 33.1 pg (ref 26.0–34.0)
MCHC: 34.6 g/dL (ref 30.0–36.0)
MCV: 95.7 fL (ref 80.0–100.0)
Monocytes Absolute: 0.5 10*3/uL (ref 0.1–1.0)
Monocytes Relative: 6 %
Neutro Abs: 6.3 10*3/uL (ref 1.7–7.7)
Neutrophils Relative %: 74 %
Platelet Count: 272 10*3/uL (ref 150–400)
RBC: 3.72 MIL/uL — ABNORMAL LOW (ref 3.87–5.11)
RDW: 12.2 % (ref 11.5–15.5)
WBC Count: 8.3 10*3/uL (ref 4.0–10.5)
nRBC: 0 % (ref 0.0–0.2)

## 2020-10-17 LAB — CMP (CANCER CENTER ONLY)
ALT: 19 U/L (ref 0–44)
AST: 16 U/L (ref 15–41)
Albumin: 3.4 g/dL — ABNORMAL LOW (ref 3.5–5.0)
Alkaline Phosphatase: 90 U/L (ref 38–126)
Anion gap: 6 (ref 5–15)
BUN: 10 mg/dL (ref 8–23)
CO2: 31 mmol/L (ref 22–32)
Calcium: 9.1 mg/dL (ref 8.9–10.3)
Chloride: 106 mmol/L (ref 98–111)
Creatinine: 0.8 mg/dL (ref 0.44–1.00)
GFR, Estimated: >60 mL/min (ref >60)
Glucose, Bld: 92 mg/dL (ref 70–99)
Potassium: 4 mmol/L (ref 3.5–5.1)
Sodium: 143 mmol/L (ref 135–145)
Total Bilirubin: 0.8 mg/dL (ref 0.3–1.2)
Total Protein: 6.5 g/dL (ref 6.5–8.1)

## 2020-10-17 NOTE — Progress Notes (Signed)
[facility] at [place], [place] [address] [phone]   Interval Evaluation  Date of Service: 10/17/20 Patient Name: Darlene Marsh Patient MRN: [id number] Patient DOB: 12-Jun-1950 Provider: Ventura Sellers, MD  Identifying Statement:  Darlene Marsh is a 70 y.o. 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 Marcello Moores; path demonstrates Glioblastoma IDH-wt   10/07/2020 -  Chemotherapy    Patient is on Treatment Plan: BRAIN GLIOBLASTOMA RADIATION THERAPY WITH CONCURRENT TEMOZOLOMIDE 75 MG/M2 DAILY FOLLOWED BY SEQUENTIAL MAINTENANCE TEMOZOLOMIDE X 6-12 CYCLES        Biomarkers:  MGMT Unknown.  IDH 1/2 Wild type.  EGFR Unknown  TERT Unknown   Interval History:  Darlene Marsh presents today for follow up, now having completed 2 weeks of IMRT and Temodar.  She describes no new or progressive neurologic deficits today.  Fatigue symptoms have remained mild.  She maintains full activity, functional independence.  No recurrence of seizures.  H+P (09/26/20) Patient presented to medical attention earlier this month with new onset seizure episode, characterized as "left arm shaking and tremoring, with some involvement of the leg".  Prior to this she had no complaints, and was independent.  CNS imaging demonstrated nodularly enhancing right frontal mass.  She underwent craniotomy, resection with Dr. Marcello Moores on 09/05/20; path demonstrated glioblastoma IDH-wt.  Following surgery, she experienced some left sided weakness and was discharged for inpatient rehab course.  After discharge this past week, she is close to her baseline, still using a walker for some ambulation.  Otherwise no additional seizures, no headaches.  Medications: Current Outpatient Medications on File Prior to Visit  Medication Sig Dispense Refill  .  levETIRAcetam (KEPPRA) 500 MG tablet Take 1 tablet (500 mg total) by mouth 2 (two) times daily. 60 tablet 0  . ondansetron (ZOFRAN) 8 MG tablet Take 1 tablet (8 mg total) by mouth 2 (two) times daily as needed (nausea and vomiting). May take 30-60 minutes prior to Temodar administration if nausea/vomiting occurs. 30 tablet 1  . temozolomide (TEMODAR) 100 MG capsule Take 1 capsule (100 mg total) by mouth daily. May take on an empty stomach to decrease nausea & vomiting. 42 capsule 0  . temozolomide (TEMODAR) 20 MG capsule Take 1 capsule (20 mg total) by mouth daily. May take on an empty stomach to decrease nausea & vomiting. 42 capsule 0  . acetaminophen-codeine (TYLENOL #3) 300-30 MG tablet Take 1 tablet by mouth every 12 (twelve) hours as needed for severe pain. (Patient not taking: Reported on 10/17/2020) 14 tablet 0   No current facility-administered medications on file prior to visit.    Allergies: No Known Allergies Past Medical History:  Past Medical History:  Diagnosis Date  . Hyperlipidemia   . Hypertension   . Thyroid disease    underactive  . Tremors of nervous system    Past Surgical History:  Past Surgical History:  Procedure Laterality Date  . APPLICATION OF CRANIAL NAVIGATION Right 09/05/2020   Procedure: APPLICATION OF CRANIAL NAVIGATION;  Surgeon: Vallarie Mare, MD;  Location: [facility];  Service: Neurosurgery;  Laterality: Right;  . CRANIOTOMY Right 09/05/2020   Procedure: Craniotomy - right Frontal interhemispheric approach for tumor resection with BRAIN LAB;  Surgeon: Vallarie Mare, MD;  Location: [facility];  Service: Neurosurgery;  Laterality: Right;   Social History:  Social History   Socioeconomic  History  . Marital status: Married    Spouse name: Not on file  . Number of children: Not on file  . Years of education: Not on file  . Highest education level: Not on file  Occupational History  . Not on file  Tobacco Use  . Smoking status: Never Smoker  .  Smokeless tobacco: Never Used  Vaping Use  . Vaping Use: Never used  Substance and Sexual Activity  . Alcohol use: Never  . Drug use: Never  . Sexual activity: Not on file  Other Topics Concern  . Not on file  Social History Narrative  . Not on file   Social Determinants of Health   Financial Resource Strain: Not on file  Food Insecurity: Not on file  Transportation Needs: Not on file  Physical Activity: Not on file  Stress: Not on file  Social Connections: Not on file  Intimate Partner Violence: Not on file   Family History:  Family History  Problem Relation Age of Onset  . Cancer Mother        Breast cancer  . Cancer Sister        Breast  . Cancer Sister        Breast    Review of Systems: Constitutional: Doesn't report fevers, chills or abnormal weight loss Eyes: Doesn't report blurriness of vision Ears, nose, mouth, throat, and face: Doesn't report sore throat Respiratory: Doesn't report cough, dyspnea or wheezes Cardiovascular: Doesn't report palpitation, chest discomfort  Gastrointestinal:  Doesn't report nausea, constipation, diarrhea GU: Doesn't report incontinence Skin: Doesn't report skin rashes Neurological: Per HPI Musculoskeletal: Doesn't report joint pain Behavioral/Psych: Doesn't report anxiety  Physical Exam: Vitals:   10/17/20 1010  BP: 106/73  Pulse: 64  Resp: 16  Temp: 97.6 F (36.4 C)  SpO2: 100%   KPS: 80. General: Alert, cooperative, pleasant, in no acute distress Head: Normal EENT: No conjunctival injection or scleral icterus.  Lungs: Resp effort normal Cardiac: Regular rate Abdomen: Non-distended abdomen Skin: No rashes cyanosis or petechiae. Extremities: No clubbing or edema  Neurologic Exam: Mental Status: Awake, alert, attentive to examiner. Oriented to self and environment. Language is fluent with intact comprehension.  Cranial Nerves: Visual acuity is grossly normal. Visual fields are full. Extra-ocular movements intact.  No ptosis. Face is symmetric Motor: Tone and bulk are normal. Subtle left arm and leg weakness. Reflexes are symmetric, no pathologic reflexes present.  Sensory: Intact to light touch Gait: Mild dystaxia   Labs: I have reviewed the data as listed    Component Value Date/Time   NA 143 10/17/2020 0951   K 4.0 10/17/2020 0951   CL 106 10/17/2020 0951   CO2 31 10/17/2020 0951   GLUCOSE 92 10/17/2020 0951   BUN 10 10/17/2020 0951   CREATININE 0.80 10/17/2020 0951   CREATININE 0.74 03/15/2019 0817   CALCIUM 9.1 10/17/2020 0951   PROT 6.5 10/17/2020 0951   ALBUMIN 3.4 (L) 10/17/2020 0951   AST 16 10/17/2020 0951   ALT 19 10/17/2020 0951   ALKPHOS 90 10/17/2020 0951   BILITOT 0.8 10/17/2020 0951   GFRNONAA >60 10/17/2020 0951   GFRNONAA 75 01/19/2018 0939   GFRAA 87 01/19/2018 0939   Lab Results  Component Value Date   WBC 8.3 10/17/2020   NEUTROABS 6.3 10/17/2020   HGB 12.3 10/17/2020   HCT 35.6 (L) 10/17/2020   MCV 95.7 10/17/2020   PLT 272 10/17/2020    Assessment/Plan Glioblastoma multiforme of frontal lobe ([place]) [C71.1]  Darlene Marsh is clinically stable, now having completed 2 weeks of IMRT and concurrent Temozolomide.  Labs are within normal limits.  We recommended continuing with course of intensity modulated radiation therapy and concurrent daily Temozolomide.  Radiation will be administered Mon-Fri over 6 weeks, Temodar will be dosed at 39m/m2 to be given daily over 42 days.  We reviewed side effects of temodar, including fatigue, nausea/vomiting, constipation, and cytopenias.  Chemotherapy should be held for the following:  ANC less than 1,000  Platelets less than 100,000  LFT or creatinine greater than 2x ULN  If clinical concerns/contraindications develop  Every 2 weeks during radiation, labs will be checked accompanied by a clinical evaluation in the brain tumor clinic.  Will continue Keppra 5015mBID.  All questions were answered. The patient knows  to call the clinic with any problems, questions or concerns. No barriers to learning were detected.  The total time spent in the encounter was 30 minutes and more than 50% was on counseling and review of test results   ZaVentura SellersMD Medical Director of Neuro-Oncology [facility] We[facility]itou5/19/22 11:08 AM

## 2020-10-18 ENCOUNTER — Ambulatory Visit
Admission: RE | Admit: 2020-10-18 | Discharge: 2020-10-18 | Disposition: A | Discharge status: Home / Self Care | Payer: Medicare Other | Source: Ambulatory Visit | Attending: Radiation Oncology | Admitting: Radiation Oncology

## 2020-10-18 ENCOUNTER — Other Ambulatory Visit (HOSPITAL_COMMUNITY): Payer: Self-pay

## 2020-10-18 DIAGNOSIS — C711 Malignant neoplasm of frontal lobe: Secondary | ICD-10-CM | POA: Diagnosis not present

## 2020-10-18 DIAGNOSIS — Z79899 Other long term (current) drug therapy: Secondary | ICD-10-CM | POA: Diagnosis not present

## 2020-10-18 DIAGNOSIS — Z510 Encounter for antineoplastic radiation therapy: Secondary | ICD-10-CM | POA: Diagnosis not present

## 2020-10-19 ENCOUNTER — Other Ambulatory Visit (HOSPITAL_COMMUNITY): Payer: Self-pay

## 2020-10-21 ENCOUNTER — Other Ambulatory Visit (HOSPITAL_COMMUNITY): Payer: Self-pay

## 2020-10-21 ENCOUNTER — Ambulatory Visit
Admission: RE | Admit: 2020-10-21 | Discharge: 2020-10-21 | Disposition: A | Discharge status: Home / Self Care | Payer: Medicare Other | Source: Ambulatory Visit | Attending: Radiation Oncology | Admitting: Radiation Oncology

## 2020-10-21 ENCOUNTER — Other Ambulatory Visit: Payer: Self-pay

## 2020-10-21 DIAGNOSIS — Z510 Encounter for antineoplastic radiation therapy: Secondary | ICD-10-CM | POA: Diagnosis not present

## 2020-10-21 DIAGNOSIS — C711 Malignant neoplasm of frontal lobe: Secondary | ICD-10-CM | POA: Diagnosis not present

## 2020-10-21 DIAGNOSIS — Z79899 Other long term (current) drug therapy: Secondary | ICD-10-CM | POA: Diagnosis not present

## 2020-10-22 ENCOUNTER — Other Ambulatory Visit (HOSPITAL_COMMUNITY): Payer: Self-pay

## 2020-10-22 ENCOUNTER — Ambulatory Visit
Admission: RE | Admit: 2020-10-22 | Discharge: 2020-10-22 | Disposition: A | Discharge status: Home / Self Care | Payer: Medicare Other | Source: Ambulatory Visit | Attending: Radiation Oncology | Admitting: Radiation Oncology

## 2020-10-22 DIAGNOSIS — C711 Malignant neoplasm of frontal lobe: Secondary | ICD-10-CM | POA: Diagnosis not present

## 2020-10-22 DIAGNOSIS — Z510 Encounter for antineoplastic radiation therapy: Secondary | ICD-10-CM | POA: Diagnosis not present

## 2020-10-22 DIAGNOSIS — Z79899 Other long term (current) drug therapy: Secondary | ICD-10-CM | POA: Diagnosis not present

## 2020-10-23 ENCOUNTER — Other Ambulatory Visit: Payer: Self-pay

## 2020-10-23 ENCOUNTER — Ambulatory Visit
Admission: RE | Admit: 2020-10-23 | Discharge: 2020-10-23 | Disposition: A | Discharge status: Home / Self Care | Payer: Medicare Other | Source: Ambulatory Visit | Attending: Radiation Oncology | Admitting: Radiation Oncology

## 2020-10-23 ENCOUNTER — Other Ambulatory Visit (HOSPITAL_COMMUNITY): Payer: Self-pay

## 2020-10-23 DIAGNOSIS — Z510 Encounter for antineoplastic radiation therapy: Secondary | ICD-10-CM | POA: Diagnosis not present

## 2020-10-23 DIAGNOSIS — Z79899 Other long term (current) drug therapy: Secondary | ICD-10-CM | POA: Diagnosis not present

## 2020-10-23 DIAGNOSIS — C711 Malignant neoplasm of frontal lobe: Secondary | ICD-10-CM | POA: Diagnosis not present

## 2020-10-24 ENCOUNTER — Ambulatory Visit
Admission: RE | Admit: 2020-10-24 | Discharge: 2020-10-24 | Disposition: A | Discharge status: Home / Self Care | Payer: Medicare Other | Source: Ambulatory Visit | Attending: Radiation Oncology | Admitting: Radiation Oncology

## 2020-10-24 DIAGNOSIS — Z510 Encounter for antineoplastic radiation therapy: Secondary | ICD-10-CM | POA: Diagnosis not present

## 2020-10-24 DIAGNOSIS — C711 Malignant neoplasm of frontal lobe: Secondary | ICD-10-CM | POA: Diagnosis not present

## 2020-10-24 DIAGNOSIS — Z79899 Other long term (current) drug therapy: Secondary | ICD-10-CM | POA: Diagnosis not present

## 2020-10-25 ENCOUNTER — Other Ambulatory Visit (HOSPITAL_COMMUNITY): Payer: Self-pay

## 2020-10-25 ENCOUNTER — Ambulatory Visit
Admission: RE | Admit: 2020-10-25 | Discharge: 2020-10-25 | Disposition: A | Discharge status: Home / Self Care | Payer: Medicare Other | Source: Ambulatory Visit | Attending: Radiation Oncology | Admitting: Radiation Oncology

## 2020-10-25 DIAGNOSIS — Z79899 Other long term (current) drug therapy: Secondary | ICD-10-CM | POA: Diagnosis not present

## 2020-10-25 DIAGNOSIS — Z510 Encounter for antineoplastic radiation therapy: Secondary | ICD-10-CM | POA: Diagnosis not present

## 2020-10-25 DIAGNOSIS — C711 Malignant neoplasm of frontal lobe: Secondary | ICD-10-CM | POA: Diagnosis not present

## 2020-10-27 DIAGNOSIS — C711 Malignant neoplasm of frontal lobe: Secondary | ICD-10-CM | POA: Diagnosis not present

## 2020-10-27 DIAGNOSIS — Z510 Encounter for antineoplastic radiation therapy: Secondary | ICD-10-CM | POA: Diagnosis not present

## 2020-10-27 DIAGNOSIS — Z79899 Other long term (current) drug therapy: Secondary | ICD-10-CM | POA: Diagnosis not present

## 2020-10-29 ENCOUNTER — Other Ambulatory Visit: Payer: Self-pay

## 2020-10-29 ENCOUNTER — Other Ambulatory Visit (HOSPITAL_COMMUNITY): Payer: Self-pay

## 2020-10-29 ENCOUNTER — Inpatient Hospital Stay (HOSPITAL_BASED_OUTPATIENT_CLINIC_OR_DEPARTMENT_OTHER): Payer: Medicare Other | Admitting: Internal Medicine

## 2020-10-29 ENCOUNTER — Inpatient Hospital Stay: Payer: Medicare Other

## 2020-10-29 ENCOUNTER — Ambulatory Visit
Admission: RE | Admit: 2020-10-29 | Discharge: 2020-10-29 | Disposition: A | Discharge status: Home / Self Care | Payer: Medicare Other | Source: Ambulatory Visit | Attending: Radiation Oncology | Admitting: Radiation Oncology

## 2020-10-29 VITALS — BP 122/69 | HR 66 | Temp 97.4°F | Resp 18 | Ht 66.0 in | Wt 125.2 lb

## 2020-10-29 DIAGNOSIS — C711 Malignant neoplasm of frontal lobe: Secondary | ICD-10-CM

## 2020-10-29 DIAGNOSIS — Z79899 Other long term (current) drug therapy: Secondary | ICD-10-CM | POA: Diagnosis not present

## 2020-10-29 DIAGNOSIS — Z510 Encounter for antineoplastic radiation therapy: Secondary | ICD-10-CM | POA: Diagnosis not present

## 2020-10-29 LAB — CBC WITH DIFFERENTIAL (CANCER CENTER ONLY)
Abs Immature Granulocytes: 0.02 10*3/uL (ref 0.00–0.07)
Basophils Absolute: 0 10*3/uL (ref 0.0–0.1)
Basophils Relative: 0 %
Eosinophils Absolute: 0.1 10*3/uL (ref 0.0–0.5)
Eosinophils Relative: 1 %
HCT: 37.1 % (ref 36.0–46.0)
Hemoglobin: 12.6 g/dL (ref 12.0–15.0)
Immature Granulocytes: 0 %
Lymphocytes Relative: 14 %
Lymphs Abs: 1.1 10*3/uL (ref 0.7–4.0)
MCH: 32.8 pg (ref 26.0–34.0)
MCHC: 34 g/dL (ref 30.0–36.0)
MCV: 96.6 fL (ref 80.0–100.0)
Monocytes Absolute: 0.5 10*3/uL (ref 0.1–1.0)
Monocytes Relative: 6 %
Neutro Abs: 6.2 10*3/uL (ref 1.7–7.7)
Neutrophils Relative %: 79 %
Platelet Count: 328 10*3/uL (ref 150–400)
RBC: 3.84 MIL/uL — ABNORMAL LOW (ref 3.87–5.11)
RDW: 12.5 % (ref 11.5–15.5)
WBC Count: 7.9 10*3/uL (ref 4.0–10.5)
nRBC: 0 % (ref 0.0–0.2)

## 2020-10-29 LAB — CMP (CANCER CENTER ONLY)
ALT: 22 U/L (ref 0–44)
AST: 21 U/L (ref 15–41)
Albumin: 3.6 g/dL (ref 3.5–5.0)
Alkaline Phosphatase: 82 U/L (ref 38–126)
Anion gap: 8 (ref 5–15)
BUN: 7 mg/dL — ABNORMAL LOW (ref 8–23)
CO2: 29 mmol/L (ref 22–32)
Calcium: 9 mg/dL (ref 8.9–10.3)
Chloride: 105 mmol/L (ref 98–111)
Creatinine: 0.73 mg/dL (ref 0.44–1.00)
GFR, Estimated: >60 mL/min (ref >60)
Glucose, Bld: 97 mg/dL (ref 70–99)
Potassium: 3.7 mmol/L (ref 3.5–5.1)
Sodium: 142 mmol/L (ref 135–145)
Total Bilirubin: 0.9 mg/dL (ref 0.3–1.2)
Total Protein: 6.6 g/dL (ref 6.5–8.1)

## 2020-10-29 NOTE — Progress Notes (Signed)
[facility] at [place], [place] [address] [phone]   Interval Evaluation  Date of Service: 10/29/20 Patient Name: Darlene Marsh Patient MRN: [id number] Patient DOB: Feb 23, 1951 Provider: Ventura Sellers, MD  Identifying Statement:  Darlene Marsh is a 70 y.o. 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 Marcello Moores; path demonstrates Glioblastoma IDH-wt   10/07/2020 -  Chemotherapy    Patient is on Treatment Plan: BRAIN GLIOBLASTOMA RADIATION THERAPY WITH CONCURRENT TEMOZOLOMIDE 75 MG/M2 DAILY FOLLOWED BY SEQUENTIAL MAINTENANCE TEMOZOLOMIDE X 6-12 CYCLES        Biomarkers:  MGMT Unknown.  IDH 1/2 Wild type.  EGFR Unknown  TERT Unknown   Interval History:  Darlene Marsh presents today for follow up, now having completed 4 weeks of IMRT and Temodar.  No new or progressive neurologic deficits.  Poor energy and fatigue are modestly worse than prior.  She maintains full activity, functional independence, despite recent soft tissue foot injury (accident).  No recurrence of seizures.  H+P (09/26/20) Patient presented to medical attention earlier this month with new onset seizure episode, characterized as "left arm shaking and tremoring, with some involvement of the leg".  Prior to this she had no complaints, and was independent.  CNS imaging demonstrated nodularly enhancing right frontal mass.  She underwent craniotomy, resection with Dr. Marcello Moores on 09/05/20; path demonstrated glioblastoma IDH-wt.  Following surgery, she experienced some left sided weakness and was discharged for inpatient rehab course.  After discharge this past week, she is close to her baseline, still using a walker for some ambulation.  Otherwise no additional seizures, no headaches.  Medications: Current Outpatient Medications on File Prior  to Visit  Medication Sig Dispense Refill  . levETIRAcetam (KEPPRA) 500 MG tablet Take 1 tablet (500 mg total) by mouth 2 (two) times daily. 60 tablet 0  . ondansetron (ZOFRAN) 8 MG tablet Take 1 tablet (8 mg total) by mouth 2 (two) times daily as needed (nausea and vomiting). May take 30-60 minutes prior to Temodar administration if nausea/vomiting occurs. 30 tablet 1  . temozolomide (TEMODAR) 100 MG capsule Take 1 capsule (100 mg total) by mouth daily. May take on an empty stomach to decrease nausea & vomiting. 42 capsule 0  . temozolomide (TEMODAR) 20 MG capsule Take 1 capsule (20 mg total) by mouth daily. May take on an empty stomach to decrease nausea & vomiting. 42 capsule 0  . acetaminophen-codeine (TYLENOL #3) 300-30 MG tablet Take 1 tablet by mouth every 12 (twelve) hours as needed for severe pain. (Patient not taking: Reported on 10/29/2020) 14 tablet 0   No current facility-administered medications on file prior to visit.    Allergies: No Known Allergies Past Medical History:  Past Medical History:  Diagnosis Date  . Hyperlipidemia   . Hypertension   . Thyroid disease    underactive  . Tremors of nervous system    Past Surgical History:  Past Surgical History:  Procedure Laterality Date  . APPLICATION OF CRANIAL NAVIGATION Right 09/05/2020   Procedure: APPLICATION OF CRANIAL NAVIGATION;  Surgeon: Vallarie Mare, MD;  Location: [facility];  Service: Neurosurgery;  Laterality: Right;  . CRANIOTOMY Right 09/05/2020   Procedure: Craniotomy - right Frontal interhemispheric approach for tumor resection with BRAIN LAB;  Surgeon: Vallarie Mare, MD;  Location: [facility];  Service: Neurosurgery;  Laterality: Right;  Social History:  Social History   Socioeconomic History  . Marital status: Married    Spouse name: Not on file  . Number of children: Not on file  . Years of education: Not on file  . Highest education level: Not on file  Occupational History  . Not on file  Tobacco  Use  . Smoking status: Never Smoker  . Smokeless tobacco: Never Used  Vaping Use  . Vaping Use: Never used  Substance and Sexual Activity  . Alcohol use: Never  . Drug use: Never  . Sexual activity: Not on file  Other Topics Concern  . Not on file  Social History Narrative  . Not on file   Social Determinants of Health   Financial Resource Strain: Not on file  Food Insecurity: Not on file  Transportation Needs: Not on file  Physical Activity: Not on file  Stress: Not on file  Social Connections: Not on file  Intimate Partner Violence: Not on file   Family History:  Family History  Problem Relation Age of Onset  . Cancer Mother        Breast cancer  . Cancer Sister        Breast  . Cancer Sister        Breast    Review of Systems: Constitutional: Doesn't report fevers, chills or abnormal weight loss Eyes: Doesn't report blurriness of vision Ears, nose, mouth, throat, and face: Doesn't report sore throat Respiratory: Doesn't report cough, dyspnea or wheezes Cardiovascular: Doesn't report palpitation, chest discomfort  Gastrointestinal:  Doesn't report nausea, constipation, diarrhea GU: Doesn't report incontinence Skin: Doesn't report skin rashes Neurological: Per HPI Musculoskeletal: Doesn't report joint pain Behavioral/Psych: Doesn't report anxiety  Physical Exam: Vitals:   10/29/20 1022  BP: 122/69  Pulse: 66  Resp: 18  Temp: (!) 97.4 F (36.3 C)  SpO2: 100%   KPS: 80. General: Alert, cooperative, pleasant, in no acute distress Head: Normal EENT: No conjunctival injection or scleral icterus.  Lungs: Resp effort normal Cardiac: Regular rate Abdomen: Non-distended abdomen Skin: No rashes cyanosis or petechiae. Extremities: Left foot lateralized edema  Neurologic Exam: Mental Status: Awake, alert, attentive to examiner. Oriented to self and environment. Language is fluent with intact comprehension.  Cranial Nerves: Visual acuity is grossly normal.  Visual fields are full. Extra-ocular movements intact. No ptosis. Face is symmetric Motor: Tone and bulk are normal. Subtle left arm and leg weakness. Reflexes are symmetric, no pathologic reflexes present.  Sensory: Intact to light touch Gait: Mild dystaxia   Labs: I have reviewed the data as listed    Component Value Date/Time   NA 143 10/17/2020 0951   K 4.0 10/17/2020 0951   CL 106 10/17/2020 0951   CO2 31 10/17/2020 0951   GLUCOSE 92 10/17/2020 0951   BUN 10 10/17/2020 0951   CREATININE 0.80 10/17/2020 0951   CREATININE 0.74 03/15/2019 0817   CALCIUM 9.1 10/17/2020 0951   PROT 6.5 10/17/2020 0951   ALBUMIN 3.4 (L) 10/17/2020 0951   AST 16 10/17/2020 0951   ALT 19 10/17/2020 0951   ALKPHOS 90 10/17/2020 0951   BILITOT 0.8 10/17/2020 0951   GFRNONAA >60 10/17/2020 0951   GFRNONAA 75 01/19/2018 0939   GFRAA 87 01/19/2018 0939   Lab Results  Component Value Date   WBC 7.9 10/29/2020   NEUTROABS 6.2 10/29/2020   HGB 12.6 10/29/2020   HCT 37.1 10/29/2020   MCV 96.6 10/29/2020   PLT 328 10/29/2020    Assessment/Plan Glioblastoma  multiforme of frontal lobe ([place]) [C71.1]  Darlene Marsh is clinically stable, now having completed 4 weeks of IMRT and concurrent Temozolomide.  No objective neurologic deficits today.  Labs are within normal limits.  We recommended continuing with course of intensity modulated radiation therapy and concurrent daily Temozolomide.  Radiation will be administered Mon-Fri over 6 weeks, Temodar will be dosed at 30m/m2 to be given daily over 42 days.  We reviewed side effects of temodar, including fatigue, nausea/vomiting, constipation, and cytopenias.  Chemotherapy should be held for the following:  ANC less than 1,000  Platelets less than 100,000  LFT or creatinine greater than 2x ULN  If clinical concerns/contraindications develop  Every 2 weeks during radiation, labs will be checked accompanied by a clinical evaluation in the brain tumor  clinic.  Will continue Keppra 5072mBID.  All questions were answered. The patient knows to call the clinic with any problems, questions or concerns. No barriers to learning were detected.  I have spent a total of 30 minutes of face-to-face and non-face-to-face time, excluding clinical staff time, preparing to see patient, ordering tests and/or medications, counseling the patient, and independently interpreting results and communicating results to the patient/family/caregiver    ZaVentura SellersMD Medical Director of Neuro-Oncology [facility] We[facility]outte5/31/22 10:36 AM

## 2020-10-30 ENCOUNTER — Ambulatory Visit
Admission: RE | Admit: 2020-10-30 | Discharge: 2020-10-30 | Disposition: A | Discharge status: Home / Self Care | Payer: Medicare Other | Source: Ambulatory Visit | Attending: Radiation Oncology | Admitting: Radiation Oncology

## 2020-10-30 DIAGNOSIS — Z79899 Other long term (current) drug therapy: Secondary | ICD-10-CM | POA: Insufficient documentation

## 2020-10-30 DIAGNOSIS — Z510 Encounter for antineoplastic radiation therapy: Secondary | ICD-10-CM | POA: Diagnosis not present

## 2020-10-30 DIAGNOSIS — C711 Malignant neoplasm of frontal lobe: Secondary | ICD-10-CM | POA: Diagnosis not present

## 2020-10-31 ENCOUNTER — Ambulatory Visit: Payer: Medicare Other | Admitting: Family Medicine

## 2020-10-31 ENCOUNTER — Ambulatory Visit
Admission: RE | Admit: 2020-10-31 | Discharge: 2020-10-31 | Disposition: A | Discharge status: Home / Self Care | Payer: Medicare Other | Source: Ambulatory Visit | Attending: Radiation Oncology | Admitting: Radiation Oncology

## 2020-10-31 ENCOUNTER — Other Ambulatory Visit: Payer: Self-pay

## 2020-10-31 DIAGNOSIS — C711 Malignant neoplasm of frontal lobe: Secondary | ICD-10-CM | POA: Diagnosis not present

## 2020-10-31 DIAGNOSIS — Z510 Encounter for antineoplastic radiation therapy: Secondary | ICD-10-CM | POA: Diagnosis not present

## 2020-10-31 DIAGNOSIS — Z79899 Other long term (current) drug therapy: Secondary | ICD-10-CM | POA: Diagnosis not present

## 2020-11-01 ENCOUNTER — Ambulatory Visit
Admission: RE | Admit: 2020-11-01 | Discharge: 2020-11-01 | Disposition: A | Discharge status: Home / Self Care | Payer: Medicare Other | Source: Ambulatory Visit | Attending: Radiation Oncology | Admitting: Radiation Oncology

## 2020-11-01 ENCOUNTER — Ambulatory Visit: Payer: Medicare Other | Admitting: Internal Medicine

## 2020-11-01 DIAGNOSIS — Z510 Encounter for antineoplastic radiation therapy: Secondary | ICD-10-CM | POA: Diagnosis not present

## 2020-11-01 DIAGNOSIS — Z79899 Other long term (current) drug therapy: Secondary | ICD-10-CM | POA: Diagnosis not present

## 2020-11-01 DIAGNOSIS — C711 Malignant neoplasm of frontal lobe: Secondary | ICD-10-CM | POA: Diagnosis not present

## 2020-11-04 ENCOUNTER — Other Ambulatory Visit: Payer: Self-pay

## 2020-11-04 ENCOUNTER — Ambulatory Visit
Admission: RE | Admit: 2020-11-04 | Discharge: 2020-11-04 | Disposition: A | Discharge status: Home / Self Care | Payer: Medicare Other | Source: Ambulatory Visit | Attending: Radiation Oncology | Admitting: Radiation Oncology

## 2020-11-04 ENCOUNTER — Other Ambulatory Visit (HOSPITAL_COMMUNITY): Payer: Self-pay

## 2020-11-04 DIAGNOSIS — C711 Malignant neoplasm of frontal lobe: Secondary | ICD-10-CM | POA: Diagnosis not present

## 2020-11-04 DIAGNOSIS — Z510 Encounter for antineoplastic radiation therapy: Secondary | ICD-10-CM | POA: Diagnosis not present

## 2020-11-04 DIAGNOSIS — Z79899 Other long term (current) drug therapy: Secondary | ICD-10-CM | POA: Diagnosis not present

## 2020-11-05 ENCOUNTER — Ambulatory Visit
Admission: RE | Admit: 2020-11-05 | Discharge: 2020-11-05 | Disposition: A | Discharge status: Home / Self Care | Payer: Medicare Other | Source: Ambulatory Visit | Attending: Radiation Oncology | Admitting: Radiation Oncology

## 2020-11-05 DIAGNOSIS — Z510 Encounter for antineoplastic radiation therapy: Secondary | ICD-10-CM | POA: Diagnosis not present

## 2020-11-05 DIAGNOSIS — Z79899 Other long term (current) drug therapy: Secondary | ICD-10-CM | POA: Diagnosis not present

## 2020-11-05 DIAGNOSIS — C711 Malignant neoplasm of frontal lobe: Secondary | ICD-10-CM | POA: Diagnosis not present

## 2020-11-06 ENCOUNTER — Other Ambulatory Visit: Payer: Self-pay

## 2020-11-06 ENCOUNTER — Ambulatory Visit
Admission: RE | Admit: 2020-11-06 | Discharge: 2020-11-06 | Disposition: A | Discharge status: Home / Self Care | Payer: Medicare Other | Source: Ambulatory Visit | Attending: Radiation Oncology | Admitting: Radiation Oncology

## 2020-11-06 DIAGNOSIS — C711 Malignant neoplasm of frontal lobe: Secondary | ICD-10-CM | POA: Diagnosis not present

## 2020-11-06 DIAGNOSIS — Z510 Encounter for antineoplastic radiation therapy: Secondary | ICD-10-CM | POA: Diagnosis not present

## 2020-11-06 DIAGNOSIS — Z79899 Other long term (current) drug therapy: Secondary | ICD-10-CM | POA: Diagnosis not present

## 2020-11-07 ENCOUNTER — Ambulatory Visit
Admission: RE | Admit: 2020-11-07 | Discharge: 2020-11-07 | Disposition: A | Discharge status: Home / Self Care | Payer: Medicare Other | Source: Ambulatory Visit | Attending: Radiation Oncology | Admitting: Radiation Oncology

## 2020-11-07 DIAGNOSIS — Z79899 Other long term (current) drug therapy: Secondary | ICD-10-CM | POA: Diagnosis not present

## 2020-11-07 DIAGNOSIS — Z510 Encounter for antineoplastic radiation therapy: Secondary | ICD-10-CM | POA: Diagnosis not present

## 2020-11-07 DIAGNOSIS — C711 Malignant neoplasm of frontal lobe: Secondary | ICD-10-CM | POA: Diagnosis not present

## 2020-11-08 ENCOUNTER — Other Ambulatory Visit: Payer: Self-pay

## 2020-11-08 ENCOUNTER — Other Ambulatory Visit (HOSPITAL_COMMUNITY): Payer: Self-pay

## 2020-11-08 ENCOUNTER — Ambulatory Visit
Admission: RE | Admit: 2020-11-08 | Discharge: 2020-11-08 | Disposition: A | Discharge status: Home / Self Care | Payer: Medicare Other | Source: Ambulatory Visit | Attending: Radiation Oncology | Admitting: Radiation Oncology

## 2020-11-08 DIAGNOSIS — Z510 Encounter for antineoplastic radiation therapy: Secondary | ICD-10-CM | POA: Diagnosis not present

## 2020-11-08 DIAGNOSIS — Z79899 Other long term (current) drug therapy: Secondary | ICD-10-CM | POA: Diagnosis not present

## 2020-11-08 DIAGNOSIS — C711 Malignant neoplasm of frontal lobe: Secondary | ICD-10-CM | POA: Diagnosis not present

## 2020-11-11 ENCOUNTER — Inpatient Hospital Stay: Payer: Medicare Other | Attending: Internal Medicine | Admitting: Internal Medicine

## 2020-11-11 ENCOUNTER — Ambulatory Visit: Payer: Medicare Other | Admitting: Internal Medicine

## 2020-11-11 ENCOUNTER — Other Ambulatory Visit (HOSPITAL_COMMUNITY): Payer: Self-pay

## 2020-11-11 ENCOUNTER — Inpatient Hospital Stay: Payer: Medicare Other

## 2020-11-11 ENCOUNTER — Other Ambulatory Visit: Payer: Self-pay

## 2020-11-11 ENCOUNTER — Other Ambulatory Visit: Payer: Medicare Other

## 2020-11-11 ENCOUNTER — Ambulatory Visit: Payer: Medicare Other

## 2020-11-11 VITALS — BP 125/73 | HR 74 | Temp 97.0°F | Resp 18 | Wt 123.1 lb

## 2020-11-11 DIAGNOSIS — C711 Malignant neoplasm of frontal lobe: Secondary | ICD-10-CM | POA: Diagnosis not present

## 2020-11-11 DIAGNOSIS — G40909 Epilepsy, unspecified, not intractable, without status epilepticus: Secondary | ICD-10-CM

## 2020-11-11 LAB — CBC WITH DIFFERENTIAL (CANCER CENTER ONLY)
Abs Immature Granulocytes: 0.03 10*3/uL (ref 0.00–0.07)
Basophils Absolute: 0 10*3/uL (ref 0.0–0.1)
Basophils Relative: 1 %
Eosinophils Absolute: 0 10*3/uL (ref 0.0–0.5)
Eosinophils Relative: 0 %
HCT: 35.6 % — ABNORMAL LOW (ref 36.0–46.0)
Hemoglobin: 12.2 g/dL (ref 12.0–15.0)
Immature Granulocytes: 0 %
Lymphocytes Relative: 14 %
Lymphs Abs: 1.1 10*3/uL (ref 0.7–4.0)
MCH: 32.8 pg (ref 26.0–34.0)
MCHC: 34.3 g/dL (ref 30.0–36.0)
MCV: 95.7 fL (ref 80.0–100.0)
Monocytes Absolute: 0.5 10*3/uL (ref 0.1–1.0)
Monocytes Relative: 6 %
Neutro Abs: 6.1 10*3/uL (ref 1.7–7.7)
Neutrophils Relative %: 79 %
Platelet Count: 283 10*3/uL (ref 150–400)
RBC: 3.72 MIL/uL — ABNORMAL LOW (ref 3.87–5.11)
RDW: 12.3 % (ref 11.5–15.5)
WBC Count: 7.8 10*3/uL (ref 4.0–10.5)
nRBC: 0 % (ref 0.0–0.2)

## 2020-11-11 LAB — CMP (CANCER CENTER ONLY)
ALT: 27 U/L (ref 0–44)
AST: 17 U/L (ref 15–41)
Albumin: 3.6 g/dL (ref 3.5–5.0)
Alkaline Phosphatase: 75 U/L (ref 38–126)
Anion gap: 9 (ref 5–15)
BUN: 8 mg/dL (ref 8–23)
CO2: 27 mmol/L (ref 22–32)
Calcium: 8.8 mg/dL — ABNORMAL LOW (ref 8.9–10.3)
Chloride: 106 mmol/L (ref 98–111)
Creatinine: 0.78 mg/dL (ref 0.44–1.00)
GFR, Estimated: >60 mL/min (ref >60)
Glucose, Bld: 117 mg/dL — ABNORMAL HIGH (ref 70–99)
Potassium: 3.7 mmol/L (ref 3.5–5.1)
Sodium: 142 mmol/L (ref 135–145)
Total Bilirubin: 0.8 mg/dL (ref 0.3–1.2)
Total Protein: 6.7 g/dL (ref 6.5–8.1)

## 2020-11-11 MED ORDER — DEXAMETHASONE 4 MG PO TABS: 4.0000 mg | ORAL_TABLET | Freq: Every day | ORAL | 1 refills | Status: DC

## 2020-11-11 NOTE — Progress Notes (Signed)
[facility] [place], [place] [address] [phone]   Interval Evaluation  Date of Service: 11/11/20 Patient Name: Darlene Marsh Patient MRN: [id number] Patient DOB: 04-06-1951 Provider: Ventura Sellers, MD  Identifying Statement:  Darlene Marsh is a 70 y.o. female with right frontal glioblastoma   Oncologic History: Oncology History  Glioblastoma multiforme of frontal lobe ([place])  09/05/2020 Surgery   Craniotomy, resection with Dr. Marcello Moores; path demonstrates Glioblastoma IDH-wt   10/07/2020 -  Chemotherapy    Patient is on Treatment Plan: BRAIN GLIOBLASTOMA RADIATION THERAPY WITH CONCURRENT TEMOZOLOMIDE 75 MG/M2 DAILY FOLLOWED BY SEQUENTIAL MAINTENANCE TEMOZOLOMIDE X 6-12 CYCLES         Biomarkers:  MGMT Unknown.  IDH 1/2 Wild type.  EGFR Unknown  TERT Unknown   Interval History:  Darlene Marsh presents today for follow up, now having completed 5+ weeks of IMRT and Temodar.  She describes significant increase in burden of fatigue.  She did not go to radiation treatment this morning because she "didn't feel up to it".  She is despondent about her loss of fucntionality, but she does not describe worsening issues with her left side.  No recurrence of seizures.  H+P (09/26/20) Patient presented to medical attention earlier this month with new onset seizure episode, characterized as "left arm shaking and tremoring, with some involvement of the leg".  Prior to this she had no complaints, and was independent.  CNS imaging demonstrated nodularly enhancing right frontal mass.  She underwent craniotomy, resection with Dr. Marcello Moores on 09/05/20; path demonstrated glioblastoma IDH-wt.  Following surgery, she experienced some left sided weakness and was discharged for inpatient rehab course.  After discharge this past week, she is close to her baseline, still using a walker for some ambulation.  Otherwise no additional seizures, no  headaches.  Medications: Current Outpatient Medications on File Prior to Visit  Medication Sig Dispense Refill   acetaminophen-codeine (TYLENOL #3) 300-30 MG tablet Take 1 tablet by mouth every 12 (twelve) hours as needed for severe pain. (Patient not taking: Reported on 10/29/2020) 14 tablet 0   levETIRAcetam (KEPPRA) 500 MG tablet Take 1 tablet (500 mg total) by mouth 2 (two) times daily. 60 tablet 0   ondansetron (ZOFRAN) 8 MG tablet Take 1 tablet (8 mg total) by mouth 2 (two) times daily as needed (nausea and vomiting). May take 30-60 minutes prior to Temodar administration if nausea/vomiting occurs. 30 tablet 1   temozolomide (TEMODAR) 100 MG capsule Take 1 capsule (100 mg total) by mouth daily. May take on an empty stomach to decrease nausea & vomiting. 42 capsule 0   temozolomide (TEMODAR) 20 MG capsule Take 1 capsule (20 mg total) by mouth daily. May take on an empty stomach to decrease nausea & vomiting. 42 capsule 0   No current facility-administered medications on file prior to visit.    Allergies: No Known Allergies Past Medical History:  Past Medical History:  Diagnosis Date   Hyperlipidemia    Hypertension    Thyroid disease    underactive   Tremors of nervous system    Past Surgical History:  Past Surgical History:  Procedure Laterality Date   APPLICATION OF CRANIAL NAVIGATION Right 09/05/2020   Procedure: APPLICATION OF CRANIAL NAVIGATION;  Surgeon: Vallarie Mare, MD;  Location: [facility];  Service: Neurosurgery;  Laterality: Right;   CRANIOTOMY Right 09/05/2020   Procedure: Craniotomy - right Frontal interhemispheric approach for tumor resection with BRAIN LAB;  Surgeon:  Vallarie Mare, MD;  Location: [facility];  Service: Neurosurgery;  Laterality: Right;   Social History:  Social History   Socioeconomic History   Marital status: Married    Spouse name: Not on file   Number of children: Not on file   Years of education: Not on file   Highest education level: Not  on file  Occupational History   Not on file  Tobacco Use   Smoking status: Never   Smokeless tobacco: Never  Vaping Use   Vaping Use: Never used  Substance and Sexual Activity   Alcohol use: Never   Drug use: Never   Sexual activity: Not on file  Other Topics Concern   Not on file  Social History Narrative   Not on file   Social Determinants of Health   Financial Resource Strain: Not on file  Food Insecurity: Not on file  Transportation Needs: Not on file  Physical Activity: Not on file  Stress: Not on file  Social Connections: Not on file  Intimate Partner Violence: Not on file   Family History:  Family History  Problem Relation Age of Onset   Cancer Mother        Breast cancer   Cancer Sister        Breast   Cancer Sister        Breast    Review of Systems: Constitutional: Doesn't report fevers, chills or abnormal weight loss Eyes: Doesn't report blurriness of vision Ears, nose, mouth, throat, and face: Doesn't report sore throat Respiratory: Doesn't report cough, dyspnea or wheezes Cardiovascular: Doesn't report palpitation, chest discomfort  Gastrointestinal:  Doesn't report nausea, constipation, diarrhea GU: Doesn't report incontinence Skin: Doesn't report skin rashes Neurological: Per HPI Musculoskeletal: Doesn't report joint pain Behavioral/Psych: Doesn't report anxiety  Physical Exam: Vitals:   11/11/20 1023  BP: 125/73  Pulse: 74  Resp: 18  Temp: (!) 97 F (36.1 C)  SpO2: 100%    KPS: 80. General: Alert, cooperative, pleasant, in no acute distress Head: Normal EENT: No conjunctival injection or scleral icterus.  Lungs: Resp effort normal Cardiac: Regular rate Abdomen: Non-distended abdomen Skin: No rashes cyanosis or petechiae. Extremities: Left foot lateralized edema  Neurologic Exam: Mental Status: Awake, alert, attentive to examiner. Oriented to self and environment. Language is fluent with intact comprehension.  Cranial Nerves:  Visual acuity is grossly normal. Visual fields are full. Extra-ocular movements intact. No ptosis. Face is symmetric Motor: Tone and bulk are normal. Subtle left arm and leg weakness. Reflexes are symmetric, no pathologic reflexes present.  Sensory: Intact to light touch Gait: Mild dystaxia   Labs: I have reviewed the data as listed    Component Value Date/Time   NA 142 10/29/2020 1007   K 3.7 10/29/2020 1007   CL 105 10/29/2020 1007   CO2 29 10/29/2020 1007   GLUCOSE 97 10/29/2020 1007   BUN 7 (L) 10/29/2020 1007   CREATININE 0.73 10/29/2020 1007   CREATININE 0.74 03/15/2019 0817   CALCIUM 9.0 10/29/2020 1007   PROT 6.6 10/29/2020 1007   ALBUMIN 3.6 10/29/2020 1007   AST 21 10/29/2020 1007   ALT 22 10/29/2020 1007   ALKPHOS 82 10/29/2020 1007   BILITOT 0.9 10/29/2020 1007   GFRNONAA >60 10/29/2020 1007   GFRNONAA 75 01/19/2018 0939   GFRAA 87 01/19/2018 0939   Lab Results  Component Value Date   WBC 7.8 11/11/2020   NEUTROABS 6.1 11/11/2020   HGB 12.2 11/11/2020   HCT 35.6 (L)  11/11/2020   MCV 95.7 11/11/2020   PLT 283 11/11/2020    Assessment/Plan Glioblastoma multiforme of frontal lobe ([place]) [C71.1]  Dorothea Yow presents with subjective changes, now having completed 6 weeks of IMRT and concurrent Temozolomide.  No worsening objective/focal neurologic deficits today.  Labs are within normal limits.  Fatigue complaints are related to treatment burden, patient age, likely CNS inflammatory burden.  We recommended initiating decadron 34m BID x3 days, then 481mdaily thereafter.    We recommended holding Temodar for the remainder of radiation, which she does plan to complete.  We will touch base with her via phone in 1 week to assess response to this intervention.  Will continue Keppra 50016mID.  All questions were answered. The patient knows to call the clinic with any problems, questions or concerns. No barriers to learning were detected.  I have spent a total  of 30 minutes of face-to-face and non-face-to-face time, excluding clinical staff time, preparing to see patient, ordering tests and/or medications, counseling the patient, and independently interpreting results and communicating results to the patient/family/caregiver    ZacVentura SellersD Medical Director of Neuro-Oncology [facility] [place]/13/22 10:23 AM

## 2020-11-12 ENCOUNTER — Ambulatory Visit
Admission: RE | Admit: 2020-11-12 | Discharge: 2020-11-12 | Disposition: A | Discharge status: Home / Self Care | Payer: Medicare Other | Source: Ambulatory Visit | Attending: Radiation Oncology | Admitting: Radiation Oncology

## 2020-11-12 ENCOUNTER — Telehealth: Payer: Self-pay | Admitting: Internal Medicine

## 2020-11-12 DIAGNOSIS — C711 Malignant neoplasm of frontal lobe: Secondary | ICD-10-CM | POA: Diagnosis not present

## 2020-11-12 DIAGNOSIS — Z79899 Other long term (current) drug therapy: Secondary | ICD-10-CM | POA: Diagnosis not present

## 2020-11-12 DIAGNOSIS — Z510 Encounter for antineoplastic radiation therapy: Secondary | ICD-10-CM | POA: Diagnosis not present

## 2020-11-12 NOTE — Telephone Encounter (Signed)
Sch per 6/13 los, pt daughter aware

## 2020-11-13 ENCOUNTER — Ambulatory Visit
Admission: RE | Admit: 2020-11-13 | Discharge: 2020-11-13 | Disposition: A | Discharge status: Home / Self Care | Payer: Medicare Other | Source: Ambulatory Visit | Attending: Radiation Oncology | Admitting: Radiation Oncology

## 2020-11-13 ENCOUNTER — Other Ambulatory Visit: Payer: Self-pay

## 2020-11-13 DIAGNOSIS — Z510 Encounter for antineoplastic radiation therapy: Secondary | ICD-10-CM | POA: Diagnosis not present

## 2020-11-13 DIAGNOSIS — C711 Malignant neoplasm of frontal lobe: Secondary | ICD-10-CM | POA: Diagnosis not present

## 2020-11-13 DIAGNOSIS — Z79899 Other long term (current) drug therapy: Secondary | ICD-10-CM | POA: Diagnosis not present

## 2020-11-14 ENCOUNTER — Ambulatory Visit
Admission: RE | Admit: 2020-11-14 | Discharge: 2020-11-14 | Disposition: A | Discharge status: Home / Self Care | Payer: Medicare Other | Source: Ambulatory Visit | Attending: Radiation Oncology | Admitting: Radiation Oncology

## 2020-11-14 DIAGNOSIS — Z510 Encounter for antineoplastic radiation therapy: Secondary | ICD-10-CM | POA: Diagnosis not present

## 2020-11-14 DIAGNOSIS — Z79899 Other long term (current) drug therapy: Secondary | ICD-10-CM | POA: Diagnosis not present

## 2020-11-14 DIAGNOSIS — C711 Malignant neoplasm of frontal lobe: Secondary | ICD-10-CM | POA: Diagnosis not present

## 2020-11-15 ENCOUNTER — Ambulatory Visit
Admission: RE | Admit: 2020-11-15 | Discharge: 2020-11-15 | Disposition: A | Discharge status: Home / Self Care | Payer: Medicare Other | Source: Ambulatory Visit | Attending: Radiation Oncology | Admitting: Radiation Oncology

## 2020-11-15 ENCOUNTER — Other Ambulatory Visit (HOSPITAL_COMMUNITY): Payer: Self-pay

## 2020-11-15 ENCOUNTER — Other Ambulatory Visit: Payer: Self-pay

## 2020-11-15 DIAGNOSIS — C711 Malignant neoplasm of frontal lobe: Secondary | ICD-10-CM | POA: Diagnosis not present

## 2020-11-15 DIAGNOSIS — Z510 Encounter for antineoplastic radiation therapy: Secondary | ICD-10-CM | POA: Diagnosis not present

## 2020-11-15 DIAGNOSIS — Z79899 Other long term (current) drug therapy: Secondary | ICD-10-CM | POA: Diagnosis not present

## 2020-11-18 ENCOUNTER — Other Ambulatory Visit: Payer: Self-pay

## 2020-11-18 ENCOUNTER — Ambulatory Visit
Admission: RE | Admit: 2020-11-18 | Discharge: 2020-11-18 | Disposition: A | Discharge status: Home / Self Care | Payer: Medicare Other | Source: Ambulatory Visit | Attending: Radiation Oncology | Admitting: Radiation Oncology

## 2020-11-18 ENCOUNTER — Ambulatory Visit: Payer: Medicare Other

## 2020-11-18 DIAGNOSIS — Z510 Encounter for antineoplastic radiation therapy: Secondary | ICD-10-CM | POA: Diagnosis not present

## 2020-11-18 DIAGNOSIS — C711 Malignant neoplasm of frontal lobe: Secondary | ICD-10-CM | POA: Diagnosis not present

## 2020-11-18 DIAGNOSIS — Z79899 Other long term (current) drug therapy: Secondary | ICD-10-CM | POA: Diagnosis not present

## 2020-11-19 ENCOUNTER — Ambulatory Visit
Admission: RE | Admit: 2020-11-19 | Discharge: 2020-11-19 | Disposition: A | Discharge status: Home / Self Care | Payer: Medicare Other | Source: Ambulatory Visit | Attending: Radiation Oncology | Admitting: Radiation Oncology

## 2020-11-19 ENCOUNTER — Encounter: Payer: Self-pay | Admitting: Radiation Oncology

## 2020-11-19 DIAGNOSIS — Z510 Encounter for antineoplastic radiation therapy: Secondary | ICD-10-CM | POA: Diagnosis not present

## 2020-11-19 DIAGNOSIS — Z79899 Other long term (current) drug therapy: Secondary | ICD-10-CM | POA: Diagnosis not present

## 2020-11-19 DIAGNOSIS — C711 Malignant neoplasm of frontal lobe: Secondary | ICD-10-CM | POA: Diagnosis not present

## 2020-11-20 DIAGNOSIS — M7732 Calcaneal spur, left foot: Secondary | ICD-10-CM | POA: Diagnosis not present

## 2020-11-20 DIAGNOSIS — M19072 Primary osteoarthritis, left ankle and foot: Secondary | ICD-10-CM | POA: Diagnosis not present

## 2020-11-20 DIAGNOSIS — M71572 Other bursitis, not elsewhere classified, left ankle and foot: Secondary | ICD-10-CM | POA: Diagnosis not present

## 2020-11-20 DIAGNOSIS — M7662 Achilles tendinitis, left leg: Secondary | ICD-10-CM | POA: Diagnosis not present

## 2020-11-21 ENCOUNTER — Inpatient Hospital Stay (HOSPITAL_BASED_OUTPATIENT_CLINIC_OR_DEPARTMENT_OTHER): Payer: Medicare Other | Admitting: Internal Medicine

## 2020-11-21 DIAGNOSIS — C711 Malignant neoplasm of frontal lobe: Secondary | ICD-10-CM

## 2020-11-21 DIAGNOSIS — G40909 Epilepsy, unspecified, not intractable, without status epilepticus: Secondary | ICD-10-CM

## 2020-11-21 NOTE — Progress Notes (Signed)
I connected with Darlene Marsh on 11/21/20 at  9:30 AM EDT by telephone visit and verified that I am speaking with the correct person using two identifiers.  I discussed the limitations, risks, security and privacy concerns of performing an evaluation and management service by telemedicine and the availability of in-person appointments. I also discussed with the patient that there may be a patient responsible charge related to this service. The patient expressed understanding and agreed to proceed.  Other persons participating in the visit and their role in the encounter:  n/a  Patient's location:  Home  Provider's location:  Office  Chief Complaint:  Glioblastoma multiforme of frontal lobe ([place])  Seizure disorder ([place])  History of Present Ilness: Darlene Marsh describes clear improvement in burden of symptoms since starting the decadron.  She was able to complete the radiation treatments earlier this week.  No new or progressive changes, aside from some mood swings and behavior changes while on the steroid, currently 4mg  daily. Observations: Language and cognition at baseline Assessment and Plan: Glioblastoma multiforme of frontal lobe ([facility])  Seizure disorder ([facility])  Clinically improved with corticosteroids.  Recommended decreasing decadron to 2mg  daily if tolerated.  MRI brain in 1 month  Follow Up Instructions: F/u after MRI brain   I discussed the assessment and treatment plan with the patient.  The patient was provided an opportunity to ask questions and all were answered.  The patient agreed with the plan and demonstrated understanding of the instructions.    The patient was advised to call back or seek an in-person evaluation if the symptoms worsen or if the condition fails to improve as anticipated.  I provided 5-10 minutes of non-face-to-face time during this enocunter.  Ventura Sellers, MD   I provided 15 minutes of non face-to-face telephone visit time during this encounter,  and > 50% was spent counseling as documented under my assessment & plan.

## 2020-11-25 ENCOUNTER — Encounter: Payer: Self-pay | Admitting: Internal Medicine

## 2020-11-27 ENCOUNTER — Encounter: Payer: Self-pay | Admitting: Internal Medicine

## 2020-11-27 ENCOUNTER — Emergency Department (HOSPITAL_COMMUNITY): Payer: Medicare Other

## 2020-11-27 ENCOUNTER — Telehealth: Payer: Self-pay | Admitting: *Deleted

## 2020-11-27 ENCOUNTER — Encounter (HOSPITAL_COMMUNITY): Payer: Self-pay

## 2020-11-27 ENCOUNTER — Other Ambulatory Visit (HOSPITAL_COMMUNITY): Payer: Self-pay

## 2020-11-27 ENCOUNTER — Emergency Department (HOSPITAL_COMMUNITY)
Admission: EM | Admit: 2020-11-27 | Discharge: 2020-11-27 | Disposition: A | Discharge status: Home / Self Care | Payer: Medicare Other | Attending: Emergency Medicine | Admitting: Emergency Medicine

## 2020-11-27 DIAGNOSIS — R531 Weakness: Secondary | ICD-10-CM | POA: Diagnosis not present

## 2020-11-27 DIAGNOSIS — D72829 Elevated white blood cell count, unspecified: Secondary | ICD-10-CM | POA: Insufficient documentation

## 2020-11-27 DIAGNOSIS — R410 Disorientation, unspecified: Secondary | ICD-10-CM | POA: Diagnosis not present

## 2020-11-27 DIAGNOSIS — Z79899 Other long term (current) drug therapy: Secondary | ICD-10-CM | POA: Diagnosis not present

## 2020-11-27 DIAGNOSIS — Z20822 Contact with and (suspected) exposure to covid-19: Secondary | ICD-10-CM | POA: Diagnosis not present

## 2020-11-27 DIAGNOSIS — R5383 Other fatigue: Secondary | ICD-10-CM | POA: Insufficient documentation

## 2020-11-27 DIAGNOSIS — C711 Malignant neoplasm of frontal lobe: Secondary | ICD-10-CM

## 2020-11-27 DIAGNOSIS — G9389 Other specified disorders of brain: Secondary | ICD-10-CM | POA: Diagnosis not present

## 2020-11-27 DIAGNOSIS — I10 Essential (primary) hypertension: Secondary | ICD-10-CM | POA: Diagnosis not present

## 2020-11-27 DIAGNOSIS — Y900 Blood alcohol level of less than 20 mg/100 ml: Secondary | ICD-10-CM | POA: Diagnosis not present

## 2020-11-27 DIAGNOSIS — E039 Hypothyroidism, unspecified: Secondary | ICD-10-CM | POA: Insufficient documentation

## 2020-11-27 DIAGNOSIS — R42 Dizziness and giddiness: Secondary | ICD-10-CM | POA: Diagnosis not present

## 2020-11-27 LAB — COMPREHENSIVE METABOLIC PANEL
ALT: 18 U/L (ref 0–44)
AST: 15 U/L (ref 15–41)
Albumin: 4.6 g/dL (ref 3.5–5.0)
Alkaline Phosphatase: 69 U/L (ref 38–126)
Anion gap: 7 (ref 5–15)
BUN: 16 mg/dL (ref 8–23)
CO2: 30 mmol/L (ref 22–32)
Calcium: 9.5 mg/dL (ref 8.9–10.3)
Chloride: 103 mmol/L (ref 98–111)
Creatinine, Ser: 0.55 mg/dL (ref 0.44–1.00)
GFR, Estimated: >60 mL/min (ref >60)
Glucose, Bld: 128 mg/dL — ABNORMAL HIGH (ref 70–99)
Potassium: 3.7 mmol/L (ref 3.5–5.1)
Sodium: 140 mmol/L (ref 135–145)
Total Bilirubin: 1.5 mg/dL — ABNORMAL HIGH (ref 0.3–1.2)
Total Protein: 7.6 g/dL (ref 6.5–8.1)

## 2020-11-27 LAB — CBC WITH DIFFERENTIAL/PLATELET
Abs Immature Granulocytes: 0.07 10*3/uL (ref 0.00–0.07)
Basophils Absolute: 0 10*3/uL (ref 0.0–0.1)
Basophils Relative: 0 %
Eosinophils Absolute: 0 10*3/uL (ref 0.0–0.5)
Eosinophils Relative: 0 %
HCT: 45.3 % (ref 36.0–46.0)
Hemoglobin: 15.8 g/dL — ABNORMAL HIGH (ref 12.0–15.0)
Immature Granulocytes: 0 %
Lymphocytes Relative: 3 %
Lymphs Abs: 0.5 10*3/uL — ABNORMAL LOW (ref 0.7–4.0)
MCH: 33.2 pg (ref 26.0–34.0)
MCHC: 34.9 g/dL (ref 30.0–36.0)
MCV: 95.2 fL (ref 80.0–100.0)
Monocytes Absolute: 0.3 10*3/uL (ref 0.1–1.0)
Monocytes Relative: 2 %
Neutro Abs: 14.8 10*3/uL — ABNORMAL HIGH (ref 1.7–7.7)
Neutrophils Relative %: 95 %
Platelets: 269 10*3/uL (ref 150–400)
RBC: 4.76 MIL/uL (ref 3.87–5.11)
RDW: 12 % (ref 11.5–15.5)
WBC: 15.8 10*3/uL — ABNORMAL HIGH (ref 4.0–10.5)
nRBC: 0 % (ref 0.0–0.2)

## 2020-11-27 LAB — RESP PANEL BY RT-PCR (FLU A&B, COVID) ARPGX2
Influenza A by PCR: NEGATIVE
Influenza B by PCR: NEGATIVE
SARS Coronavirus 2 by RT PCR: NEGATIVE

## 2020-11-27 LAB — URINALYSIS, ROUTINE W REFLEX MICROSCOPIC
Bilirubin Urine: NEGATIVE
Glucose, UA: NEGATIVE mg/dL
Hgb urine dipstick: NEGATIVE
Ketones, ur: NEGATIVE mg/dL
Nitrite: NEGATIVE
Protein, ur: NEGATIVE mg/dL
Specific Gravity, Urine: 1.011 (ref 1.005–1.030)
pH: 6 (ref 5.0–8.0)

## 2020-11-27 LAB — CBG MONITORING, ED: Glucose-Capillary: 141 mg/dL — ABNORMAL HIGH (ref 70–99)

## 2020-11-27 LAB — PROTIME-INR
INR: 0.9 (ref 0.8–1.2)
Prothrombin Time: 12 seconds (ref 11.4–15.2)

## 2020-11-27 LAB — RAPID URINE DRUG SCREEN, HOSP PERFORMED
Amphetamines: NOT DETECTED
Barbiturates: NOT DETECTED
Benzodiazepines: NOT DETECTED
Cocaine: NOT DETECTED
Opiates: NOT DETECTED
Tetrahydrocannabinol: NOT DETECTED

## 2020-11-27 LAB — ETHANOL: Alcohol, Ethyl (B): <10 mg/dL (ref <10)

## 2020-11-27 LAB — APTT: aPTT: 24 seconds (ref 24–36)

## 2020-11-27 MED ORDER — DEXAMETHASONE SODIUM PHOSPHATE 10 MG/ML IJ SOLN
10.0000 mg | Freq: Once | INTRAMUSCULAR | Status: AC
  Administered 2020-11-27: 10 mg via INTRAVENOUS
  Filled 2020-11-27: qty 1

## 2020-11-27 MED ORDER — LORAZEPAM 2 MG/ML IJ SOLN
0.5000 mg | Freq: Once | INTRAMUSCULAR | Status: AC
  Administered 2020-11-27: 0.5 mg via INTRAVENOUS
  Filled 2020-11-27: qty 1

## 2020-11-27 MED ORDER — GADOBUTROL 1 MMOL/ML IV SOLN
5.5000 mL | Freq: Once | INTRAVENOUS | Status: AC | PRN
  Administered 2020-11-27: 5.5 mL via INTRAVENOUS

## 2020-11-27 NOTE — Discharge Instructions (Addendum)
You came to the emergency department today to be evaluated for your new left-sided weakness and fatigue.  The MRI of your brain obtained showed area of enhancement with extensive surrounding edema, this may reflect postradiation changes or progressive tumor.  I spoke to this with your neuro oncologist Dr. Mickeal Skinner.  Please call his office tomorrow morning to be scheduled for an appointment at this clinic tomorrow afternoon.  Please continue to take the steroids he has prescribed you.  Get help right away if: You have a seizure, particularly if this is new. You have new symptoms, such as vision problems or trouble walking. You have bleeding that does not stop. You have trouble breathing.

## 2020-11-27 NOTE — ED Notes (Signed)
Pt not in room unable to update vitals.

## 2020-11-27 NOTE — ED Notes (Signed)
Patient is alert, answers all questions appropriately.  Husband is at the bedside

## 2020-11-27 NOTE — ED Notes (Signed)
Patient is off the unit

## 2020-11-27 NOTE — ED Triage Notes (Signed)
Pt arrived via POV, c/o left sided weakness, sensation intact, grip strength weaker on left side, states worsening confusion as well. States started over the weekend. Hx of brain CA, currently chemo and radiation tx.

## 2020-11-27 NOTE — Telephone Encounter (Signed)
Received vm message from pt's daughter. She states that her mother's symptoms did not improve with the increase in the dose of dexamethasone. She is still unable to stand, walk or even turn in bed. She was walking a week ago. Darlene Marsh states she is taking her mother to the ED today.  Message sent to Dr. Mickeal Skinner

## 2020-11-27 NOTE — ED Notes (Signed)
Patient awaiting for MRI.  Patient is alert but somewhat confused when asked to move in the bed etc.  Husband is at the bedside

## 2020-11-27 NOTE — ED Notes (Signed)
Medicated for MRI- to MRI

## 2020-11-27 NOTE — ED Provider Notes (Signed)
[facility] DEPT Provider Note   CSN: [id number] Arrival date & time: 11/27/20  1194     History Chief Complaint  Patient presents with   Weakness    Darlene Marsh is a 70 y.o. female hypertension, hyperlipidemia, thyroid disease, glioblastoma multiforme a frontal lobe (currently receiving Temozolomide and radiation therapy; last treatment 6/21).  Patient is followed by neuro oncologist Dr. Mickeal Skinner.    Presents emergency room with a chief complaint of left-sided weakness, fatigue and confusion.  Patient reports that her symptoms started this Saturday.  Symptoms have gradually worsened since then.  Patient reports that she has had similar episodes of weakness after rounds of therapy however this time symptoms are worse than usual.  Patient states that she is having special difficulty with her left foot.  Patient reports that she cannot straighten or move it.  Patient also reports that she is having issues with balance whenever standing.    Patient denies any fevers, chills, URI symptoms, visual disturbance, cough, shortness of breath, chest pain, abdominal pain, nausea, vomiting, diarrhea, dysuria, hematuria, urinary frequency, neck pain, back pain, dizziness, facial asymmetry, headaches, lightheadedness, numbness, seizures, speech difficulty, syncope.  Patient denies any recent falls or injuries.  Patient is not on any blood thinners.  Patient's husband at bedside reports that patient has been taking increased doses of her Decadron and steroids.  Patient had 4 doses on Monday, 2 doses yesterday and 2 doses this morning.   Weakness Associated symptoms: no abdominal pain, no chest pain, no cough, no diarrhea, no dizziness, no dysuria, no fever, no frequency, no headaches, no nausea, no seizures, no shortness of breath and no vomiting       Past Medical History:  Diagnosis Date   Hyperlipidemia    Hypertension    Thyroid disease    underactive   Tremors  of nervous system     Patient Active Problem List   Diagnosis Date Noted   Insomnia 09/25/2020   Seizure disorder ([place]) 09/25/2020   Dyslipidemia    Tremor    Postoperative pain    Glioblastoma multiforme of frontal lobe ([place]) 09/05/2020   Essential hypertension 01/05/2018   Hypothyroidism 08/23/2017   Hyperlipidemia 08/23/2017    Past Surgical History:  Procedure Laterality Date   APPLICATION OF CRANIAL NAVIGATION Right 09/05/2020   Procedure: APPLICATION OF CRANIAL NAVIGATION;  Surgeon: Vallarie Mare, MD;  Location: [facility];  Service: Neurosurgery;  Laterality: Right;   CRANIOTOMY Right 09/05/2020   Procedure: Craniotomy - right Frontal interhemispheric approach for tumor resection with BRAIN LAB;  Surgeon: Vallarie Mare, MD;  Location: [facility];  Service: Neurosurgery;  Laterality: Right;     OB History     Gravida  3   Para  3   Term      Preterm      AB      Living         SAB      IAB      Ectopic      Multiple      Live Births              Family History  Problem Relation Age of Onset   Cancer Mother        Breast cancer   Cancer Sister        Breast   Cancer Sister        Breast    Social History   Tobacco Use   Smoking status:  Never   Smokeless tobacco: Never  Vaping Use   Vaping Use: Never used  Substance Use Topics   Alcohol use: Never   Drug use: Never    Home Medications Prior to Admission medications   Medication Sig Start Date End Date Taking? Authorizing Provider  acetaminophen-codeine (TYLENOL #3) 300-30 MG tablet Take 1 tablet by mouth every 12 (twelve) hours as needed for severe pain. Patient not taking: Reported on 10/29/2020 09/20/20   Love, Ivan Anchors, PA-C  dexamethasone (DECADRON) 4 MG tablet Take 1 tablet (4 mg total) by mouth daily. 11/11/20   Ventura Sellers, MD  levETIRAcetam (KEPPRA) 500 MG tablet Take 1 tablet (500 mg total) by mouth 2 (two) times daily. 09/20/20   Love, Ivan Anchors, PA-C  ondansetron (ZOFRAN)  8 MG tablet Take 1 tablet (8 mg total) by mouth 2 (two) times daily as needed (nausea and vomiting). May take 30-60 minutes prior to Temodar administration if nausea/vomiting occurs. 10/03/20   Ventura Sellers, MD  temozolomide (TEMODAR) 100 MG capsule Take 1 capsule (100 mg total) by mouth daily. May take on an empty stomach to decrease nausea & vomiting. 10/03/20   Ventura Sellers, MD  temozolomide (TEMODAR) 20 MG capsule Take 1 capsule (20 mg total) by mouth daily. May take on an empty stomach to decrease nausea & vomiting. 10/03/20   Ventura Sellers, MD    Allergies    Patient has no known allergies.  Review of Systems   Review of Systems  Constitutional:  Positive for fatigue. Negative for chills and fever.  HENT:  Negative for congestion, rhinorrhea and sore throat.   Eyes:  Negative for visual disturbance.  Respiratory:  Negative for cough and shortness of breath.   Cardiovascular:  Negative for chest pain.  Gastrointestinal:  Negative for abdominal pain, diarrhea, nausea and vomiting.  Genitourinary:  Negative for difficulty urinating, dysuria, frequency, hematuria, vaginal bleeding, vaginal discharge and vaginal pain.  Musculoskeletal:  Negative for back pain and neck pain.  Skin:  Negative for color change and rash.  Allergic/Immunologic: Positive for immunocompromised state.  Neurological:  Positive for weakness. Negative for dizziness, tremors, seizures, syncope, facial asymmetry, speech difficulty, light-headedness, numbness and headaches.  Psychiatric/Behavioral:  Positive for confusion.    Physical Exam Updated Vital Signs BP 135/81 (BP Location: Right Arm)   Pulse 73   Temp 97.8 F (36.6 C) (Oral)   Resp 18   SpO2 100%   Physical Exam Vitals and nursing note reviewed.  Constitutional:      General: She is not in acute distress.    Appearance: She is not ill-appearing, toxic-appearing or diaphoretic.  HENT:     Head: Normocephalic and atraumatic.     Comments:  Patient has well-healed surgical incision to frontal scalp Eyes:     General: No scleral icterus.       Right eye: No discharge.        Left eye: No discharge.     Extraocular Movements: Extraocular movements intact.     Pupils: Pupils are equal, round, and reactive to light.  Cardiovascular:     Rate and Rhythm: Normal rate.  Pulmonary:     Effort: Pulmonary effort is normal. No tachypnea, bradypnea or respiratory distress.     Breath sounds: Decreased air movement present. No wheezing, rhonchi or rales.     Comments: Lungs clear to auscultation bilaterally, creased air movement in all lung fields Abdominal:     General: Abdomen is flat. There is  no distension. There are no signs of injury.     Palpations: Abdomen is soft. There is no mass or pulsatile mass.     Tenderness: There is no abdominal tenderness. There is no guarding or rebound.  Musculoskeletal:     Cervical back: Normal range of motion and neck supple. No edema, erythema, rigidity, torticollis or crepitus. No pain with movement, spinous process tenderness or muscular tenderness. Normal range of motion.     Right lower leg: Normal.     Left lower leg: Normal.     Comments: No deformity or tenderness to cervical, thoracic, or lumbar spine.    Skin:    General: Skin is warm and dry.  Neurological:     General: No focal deficit present.     Mental Status: She is alert.     GCS: GCS eye subscore is 4. GCS verbal subscore is 5. GCS motor subscore is 6.     Cranial Nerves: No cranial nerve deficit or facial asymmetry.     Sensory: Sensation is intact.     Motor: Pronator drift present. No weakness, tremor or seizure activity.     Coordination: Finger-Nose-Finger Test normal.     Comments: Patient is alert to person, place.  Patient able to identify the year however cannot identify the month  CN II-XII intact; performed in supine position, +5 strength to right upper extremity, 5/5 strength to dorsiflexion and plantar flexion  of right foot.    Sensation to light touch intact to bilateral upper and lower extremities  Patient has 3/5 strength to left upper extremity.  Patient has difficulty holding left lower leg against gravity.  3/5 strength to dorsiflexion and plantar flexion of left foot.  Grip strength is unequal, pronator drift positive  bilateral upper extremities, +5 strength to dorsiflexion and plantarflexion, patient able to left both legs against gravity and hold each there without difficulty   Psychiatric:        Behavior: Behavior is cooperative.    ED Results / Procedures / Treatments   Labs (all labs ordered are listed, but only abnormal results are displayed) Labs Reviewed  COMPREHENSIVE METABOLIC PANEL - Abnormal; Notable for the following components:      Result Value   Glucose, Bld 128 (*)    Total Bilirubin 1.5 (*)    All other components within normal limits  URINALYSIS, ROUTINE W REFLEX MICROSCOPIC - Abnormal; Notable for the following components:   APPearance HAZY (*)    Leukocytes,Ua LARGE (*)    Bacteria, UA RARE (*)    All other components within normal limits  CBC WITH DIFFERENTIAL/PLATELET - Abnormal; Notable for the following components:   WBC 15.8 (*)    Hemoglobin 15.8 (*)    Neutro Abs 14.8 (*)    Lymphs Abs 0.5 (*)    All other components within normal limits  CBG MONITORING, ED - Abnormal; Notable for the following components:   Glucose-Capillary 141 (*)    All other components within normal limits  RESP PANEL BY RT-PCR (FLU A&B, COVID) ARPGX2  ETHANOL  PROTIME-INR  APTT  RAPID URINE DRUG SCREEN, HOSP PERFORMED    EKG None  Radiology CT Head Wo Contrast  Result Date: 11/27/2020 CLINICAL DATA:  Dizziness. Previous craniotomy for right frontal brain tumor EXAM: CT HEAD WITHOUT CONTRAST TECHNIQUE: Contiguous axial images were obtained from the base of the skull through the vertex without intravenous contrast. COMPARISON:  Brain MRI April 2022; head CT August 30, 2020 FINDINGS: Brain:  The ventricles and sulci overall are normal in size and configuration. There is vasogenic appearing edema in the mid to upper right frontal lobe anteriorly with mild effacement of a portion of the anterior horn the right lateral ventricle. Go similar edema elsewhere. No well-defined mass on noncontrast enhanced study. No evident hemorrhage. No extra-axial fluid collection or midline shift. Elsewhere brain parenchyma appears unremarkable. Vascular: No hyperdense vessel. There is calcification in the left carotid siphon region. Skull: Patient is status post superior right frontal craniotomy. Bony calvarium otherwise intact. Sinuses/Orbits: Visualized paranasal sinuses are clear. Orbits appear symmetric bilaterally. Other: Mastoid air cells are clear. IMPRESSION: 1. Apparent vasogenic edema in the right frontal lobe region in the mid to superior aspect of the right frontal lobe mild effacement of the anterior horn the right lateral ventricle. Concern for recurrent mass in this area. Advise correlation with brain MRI pre and post-contrast to further evaluate. 2. Elsewhere brain parenchyma appears unremarkable. No acute infarct appreciable the study. No hemorrhage evident. 3.  Status post right frontal craniotomy. 4.  Mild arterial vascular calcification noted. Electronically Signed   By: Lowella Grip III M.D.   On: 11/27/2020 12:26   MR Brain W and Wo Contrast  Result Date: 11/27/2020 CLINICAL DATA:  Glioblastoma. EXAM: MRI HEAD WITHOUT AND WITH CONTRAST TECHNIQUE: Multiplanar, multiecho pulse sequences of the brain and surrounding structures were obtained without and with intravenous contrast. CONTRAST:  5.73mL GADAVIST GADOBUTROL 1 MMOL/ML IV SOLN COMPARISON:  Head CT 11/27/2020 and MRI 09/06/2020 FINDINGS: Brain: Sequelae of right frontal craniotomy and tumor resection are again identified. A resection cavity in the parasagittal right frontal lobe contains chronic blood products and has  collapsed since the prior MRI. There is a new 2.0 x 1.3 x 1.9 cm right frontal lesion located immediately lateral and superior to the collapsed resection cavity which demonstrates thick peripheral enhancement with enhancement extending medially to the falx. Extensive nonenhancing T2 hyperintensity in the surrounding right frontal white matter is greatly increased from the prior MRI with associated gyral widening and sulcal effacement. There is slight mass effect on the right lateral ventricle without significant midline shift. Elsewhere, no acute infarct, acute intracranial hemorrhage, or extra-axial fluid collection is identified. There is no hydrocephalus. Vascular: Major intracranial vascular flow voids are preserved. Skull and upper cervical spine: Right frontal craniotomy with mild underlying dural enhancement. Sinuses/Orbits: Unremarkable orbits. Mild mucosal thickening in the right sphenoid sinus. Clear mastoid air cells. Other: None. IMPRESSION: New 2 cm ring-enhancing right frontal lesion with extensive surrounding edema. This may reflect post radiation changes/radiation necrosis or progressive tumor. Electronically Signed   By: Logan Bores M.D.   On: 11/27/2020 17:14   DG Chest Portable 1 View  Result Date: 11/27/2020 CLINICAL DATA:  Fatigue and weakness, initial encounter EXAM: PORTABLE CHEST 1 VIEW COMPARISON:  None. FINDINGS: The heart size and mediastinal contours are within normal limits. Both lungs are clear. The visualized skeletal structures are unremarkable. IMPRESSION: No active disease. Electronically Signed   By: Inez Catalina M.D.   On: 11/27/2020 11:40    Procedures Procedures   Medications Ordered in ED Medications  dexamethasone (DECADRON) injection 10 mg (10 mg Intravenous Given 11/27/20 1333)  LORazepam (ATIVAN) injection 0.5 mg (0.5 mg Intravenous Given 11/27/20 1553)  gadobutrol (GADAVIST) 1 MMOL/ML injection 5.5 mL (5.5 mLs Intravenous Contrast Given 11/27/20 1614)    ED  Course  I have reviewed the triage vital signs and the nursing notes.  Pertinent labs & imaging results that  were available during my care of the patient were reviewed by me and considered in my medical decision making (see chart for details).    MDM Rules/Calculators/A&P                          Alert 70 year old female no acute distress, nontoxic-appearing.  Patient presents emergency department with a chief complaint of left-sided weakness, fatigue, and confusion.  Patient reports that symptoms began on Saturday.  Patient has a recent history of glioblastoma.  Patient recently completed surgical resection as well as 6 weeks of chemotherapy/radiation.  Patient's last chemotherapy/radiation treatment occurred on 6/21.  Patient had follow-up visit with her neuro oncologist Dr. Mickeal Skinner last week.  Patient was started on high-dose dexamethasone.  On physical exam patient is noted to have weakness to left upper and lower extremity.  Patient found to be alert to person and place.  Patient is able to identify the correct year but cannot identify the correct month.  Will obtain noncontrast head CT to evaluate for possible intracranial hemorrhage or mass causing patient's weakness.  Patient is outside the stroke window at this time.  Will obtain chest x-ray, urinalysis, CMP, CBC, respiratory panel, to evaluate for metabolic cause of patient's symptoms.  CBC shows leukocytosis at 15.8 with increased neutrophil count.  No signs of anemia.  Suspect that patient's leukocytosis is secondary to high-dose steroids. CMP is unremarkable. Urinalysis shows bacteria rare, patient has no urinary symptoms.  Low suspicion for UTI at this time. Respiratory panel negative for COVID-19 and influenza. Chest x-ray shows no active cardiopulmonary disease. UDS and ethanol negative.  Noncontrast head CT shows apparent vasogenic edema in the right frontal lobe region in the mid to superior aspect of the right frontal lobe  mild effacement of the anterior horn the right lateral ventricle.  Concern for recurrent mass in this area.  Advise correlation with brain MRI pre and postcontrast to evaluate further.  No acute infarct appreciable in this study.  No hemorrhage evident.  Spoke to neurosurgeon Dr. Malen Gauze who requests MRI.  MRI shows new 2 cm ring-enhancing right frontal lesion with extensive surrounding edema.  This may reflect postradiation changes/radiation necrosis or progressive tumor.  Spoke to neuro oncologist Dr. Mickeal Skinner reported that changes seen on MRI can be consistent with postradiation changes.  Advised that we can see the patient in the outpatient setting tomorrow.  Advised to continue her prescribed steroids.  Shared decision making with patient and patient's husband who agreed with plan to discharge and follow-up with Dr. Mickeal Skinner tomorrow in the outpatient setting.  Patient was given strict return precautions.  Patient expressed understanding of all instructions and is agreeable with this plan.  Final Clinical Impression(s) / ED Diagnoses Final diagnoses:  Left-sided weakness  Glioblastoma multiforme of frontal lobe [facility])    Rx / DC Orders ED Discharge Orders     None        Dyann Ruddle 11/27/20 2131    Gareth Morgan, MD 11/28/20 [phone]

## 2020-11-27 NOTE — Progress Notes (Signed)
                                                                                                                                                             Patient Name: Darlene Marsh MRN: [id number] DOB: 02/26/1951 Referring Physician:  Date of Service: 11/19/2020 [facility],                                                         End Of Treatment Note  Diagnoses: C71.1-Malignant neoplasm of frontal lobe  Cancer Staging: Glioblastoma of the frontal lobe.  Intent: Curative  Radiation Treatment Dates: 10/07/2020 through 11/19/2020 Site Technique Total Dose (Gy) Dose per Fx (Gy) Completed Fx Beam Energies  Brain: Brain IMRT 46/46 2 23/23 6X  Brain: Brain_Bst IMRT 14/14 2 7/7 6X   Narrative: The patient tolerated radiation therapy relatively well.   Plan: The patient will receive a call in about one month from the radiation oncology department. She will continue follow up with Dr. Mickeal Skinner as well.  ________________________________________________    Carola Rhine, [facility]

## 2020-11-27 NOTE — ED Notes (Signed)
Patient in CT

## 2020-11-27 NOTE — ED Notes (Signed)
Returns from MRI- alert but pleasantly confused

## 2020-11-27 NOTE — ED Notes (Signed)
Pharmacy tech at the bedside for medication reconciliation

## 2020-11-28 ENCOUNTER — Other Ambulatory Visit: Payer: Self-pay

## 2020-11-28 ENCOUNTER — Inpatient Hospital Stay (HOSPITAL_BASED_OUTPATIENT_CLINIC_OR_DEPARTMENT_OTHER): Payer: Medicare Other | Admitting: Internal Medicine

## 2020-11-28 VITALS — BP 131/74 | HR 73 | Temp 97.8°F | Resp 18

## 2020-11-28 DIAGNOSIS — G40909 Epilepsy, unspecified, not intractable, without status epilepticus: Secondary | ICD-10-CM | POA: Diagnosis not present

## 2020-11-28 DIAGNOSIS — C711 Malignant neoplasm of frontal lobe: Secondary | ICD-10-CM

## 2020-11-28 MED ORDER — DEXAMETHASONE 4 MG PO TABS: 4.0000 mg | ORAL_TABLET | Freq: Every day | ORAL | 1 refills | Status: DC

## 2020-11-28 NOTE — Progress Notes (Signed)
[facility] [address], [place] [address] [phone]   Interval Evaluation  Date of Service: 11/28/20 Patient Name: Darlene Marsh Patient MRN: [id number] Patient DOB: July 24, 1950 Provider: Ventura Sellers, MD  Identifying Statement:  Darlene Marsh is a 70 y.o. 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 Marcello Moores; path demonstrates Glioblastoma IDH-wt   10/07/2020 -  Chemotherapy    Patient is on Treatment Plan: BRAIN GLIOBLASTOMA RADIATION THERAPY WITH CONCURRENT TEMOZOLOMIDE 75 MG/M2 DAILY FOLLOWED BY SEQUENTIAL MAINTENANCE TEMOZOLOMIDE X 6-12 CYCLES        Dexamethasone 11/11/20: 75m 11/14/20: 415m06/23/22: 73m27m6/27/22: 73m573mbrupt clinical changes)  Biomarkers:  MGMT Unknown.  IDH 1/2 Wild type.  EGFR Unknown  TERT Unknown   Interval History:  [name] today for follow up after recent clinical changes.  She describes "weakness all over', inability to walk or perform any activities of daily living independently.  She is very fearful of falling.  Had ED visit yesterday.  Does acknowledge some degree of improvement with the higher dose of decadron last 2-3 days.  No recurrence of seizures.  H+P (09/26/20) Patient presented to medical attention earlier this month with new onset seizure episode, characterized as "left arm shaking and tremoring, with some involvement of the leg".  Prior to this she had no complaints, and was independent.  CNS imaging demonstrated nodularly enhancing right frontal mass.  She underwent craniotomy, resection with Dr. ThomMarcello Moores4/7/22; path demonstrated glioblastoma IDH-wt.  Following surgery, she experienced some left sided weakness and was discharged for inpatient rehab course.  After discharge this past week, she is close to her baseline, still using a  walker for some ambulation.  Otherwise no additional seizures, no headaches.  Medications: Current Outpatient Medications on File Prior to Visit  Medication Sig Dispense Refill   dexamethasone (DECADRON) 4 MG tablet Take 1 tablet (4 mg total) by mouth daily. 30 tablet 1   levETIRAcetam (KEPPRA) 500 MG tablet Take 1 tablet (500 mg total) by mouth 2 (two) times daily. 60 tablet 0   ondansetron (ZOFRAN) 8 MG tablet Take 1 tablet (8 mg total) by mouth 2 (two) times daily as needed (nausea and vomiting). May take 30-60 minutes prior to Temodar administration if nausea/vomiting occurs. 30 tablet 1   acetaminophen-codeine (TYLENOL #3) 300-30 MG tablet Take 1 tablet by mouth every 12 (twelve) hours as needed for severe pain. (Patient not taking: No sig reported) 14 tablet 0   temozolomide (TEMODAR) 100 MG capsule Take 1 capsule (100 mg total) by mouth daily. May take on an empty stomach to decrease nausea & vomiting. (Patient not taking: No sig reported) 42 capsule 0   temozolomide (TEMODAR) 20 MG capsule Take 1 capsule (20 mg total) by mouth daily. May take on an empty stomach to decrease nausea & vomiting. (Patient not taking: No sig reported) 42 capsule 0   No current facility-administered medications on file prior to visit.    Allergies: No Known Allergies Past Medical History:  Past Medical History:  Diagnosis Date   Hyperlipidemia    Hypertension    Thyroid disease    underactive   Tremors of nervous system    Past Surgical History:  Past Surgical History:  Procedure Laterality Date   APPLICATION OF CRANIAL NAVIGATION Right 09/05/2020   Procedure: APPLICATION OF CRANIAL NAVIGATION;  Surgeon: Vallarie Mare, MD;  Location: [facility];  Service: Neurosurgery;  Laterality: Right;   CRANIOTOMY Right 09/05/2020   Procedure: Craniotomy - right Frontal interhemispheric approach for tumor resection with BRAIN LAB;  Surgeon: Vallarie Mare, MD;  Location: [facility];  Service: Neurosurgery;   Laterality: Right;   Social History:  Social History   Socioeconomic History   Marital status: Married    Spouse name: Not on file   Number of children: Not on file   Years of education: Not on file   Highest education level: Not on file  Occupational History   Not on file  Tobacco Use   Smoking status: Never   Smokeless tobacco: Never  Vaping Use   Vaping Use: Never used  Substance and Sexual Activity   Alcohol use: Never   Drug use: Never   Sexual activity: Not on file  Other Topics Concern   Not on file  Social History Narrative   Not on file   Social Determinants of Health   Financial Resource Strain: Not on file  Food Insecurity: Not on file  Transportation Needs: Not on file  Physical Activity: Not on file  Stress: Not on file  Social Connections: Not on file  Intimate Partner Violence: Not on file   Family History:  Family History  Problem Relation Age of Onset   Cancer Mother        Breast cancer   Cancer Sister        Breast   Cancer Sister        Breast    Review of Systems: Constitutional: Doesn't report fevers, chills or abnormal weight loss Eyes: Doesn't report blurriness of vision Ears, nose, mouth, throat, and face: Doesn't report sore throat Respiratory: Doesn't report cough, dyspnea or wheezes Cardiovascular: Doesn't report palpitation, chest discomfort  Gastrointestinal:  Doesn't report nausea, constipation, diarrhea GU: Doesn't report incontinence Skin: Doesn't report skin rashes Neurological: Per HPI Musculoskeletal: Doesn't report joint pain Behavioral/Psych: Doesn't report anxiety  Physical Exam: Vitals:   11/28/20 1345  BP: 131/74  Pulse: 73  Resp: 18  Temp: 97.8 F (36.6 C)  SpO2: 98%    KPS: 60. General: Alert, cooperative, pleasant, in no acute distress Head: Normal EENT: No conjunctival injection or scleral icterus.  Lungs: Resp effort normal Cardiac: Regular rate Abdomen: Non-distended abdomen Skin: No rashes  cyanosis or petechiae. Extremities: Left foot lateralized edema  Neurologic Exam: Mental Status: Awake, alert, attentive to examiner. Oriented to self and environment. Language is fluent with intact comprehension.  Cranial Nerves: Visual acuity is grossly normal. Visual fields are full. Extra-ocular movements intact. No ptosis. Face is symmetric Motor: Tone and bulk are normal. Left arm and leg 3/5. Right sided weakness is functional in nature.  Reflexes are symmetric, no pathologic reflexes present.  Sensory: Intact to light touch Gait: Deferred   Labs: I have reviewed the data as listed    Component Value Date/Time   NA 140 11/27/2020 1014   K 3.7 11/27/2020 1014   CL 103 11/27/2020 1014   CO2 30 11/27/2020 1014   GLUCOSE 128 (H) 11/27/2020 1014   BUN 16 11/27/2020 1014   CREATININE 0.55 11/27/2020 1014   CREATININE 0.78 11/11/2020 0951   CREATININE 0.74 03/15/2019 0817   CALCIUM 9.5 11/27/2020 1014   PROT 7.6 11/27/2020 1014   ALBUMIN 4.6 11/27/2020 1014   AST 15 11/27/2020 1014   AST 17 11/11/2020 0951   ALT 18 11/27/2020 1014   ALT 27  11/11/2020 0951   ALKPHOS 69 11/27/2020 1014   BILITOT 1.5 (H) 11/27/2020 1014   BILITOT 0.8 11/11/2020 0951   GFRNONAA >60 11/27/2020 1014   GFRNONAA >60 11/11/2020 0951   GFRNONAA 75 01/19/2018 0939   GFRAA 87 01/19/2018 0939   Lab Results  Component Value Date   WBC 15.8 (H) 11/27/2020   NEUTROABS 14.8 (H) 11/27/2020   HGB 15.8 (H) 11/27/2020   HCT 45.3 11/27/2020   MCV 95.2 11/27/2020   PLT 269 11/27/2020   Imaging:  [facility] Clinician Interpretation: I have personally reviewed the CNS images as listed.  My interpretation, in the context of the patient's clinical presentation, is treatment effect vs true progression  CT Head Wo Contrast  Result Date: 11/27/2020 CLINICAL DATA:  Dizziness. Previous craniotomy for right frontal brain tumor EXAM: CT HEAD WITHOUT CONTRAST TECHNIQUE: Contiguous axial images were obtained from the  base of the skull through the vertex without intravenous contrast. COMPARISON:  Brain MRI April 2022; head CT August 30, 2020 FINDINGS: Brain: The ventricles and sulci overall are normal in size and configuration. There is vasogenic appearing edema in the mid to upper right frontal lobe anteriorly with mild effacement of a portion of the anterior horn the right lateral ventricle. Go similar edema elsewhere. No well-defined mass on noncontrast enhanced study. No evident hemorrhage. No extra-axial fluid collection or midline shift. Elsewhere brain parenchyma appears unremarkable. Vascular: No hyperdense vessel. There is calcification in the left carotid siphon region. Skull: Patient is status post superior right frontal craniotomy. Bony calvarium otherwise intact. Sinuses/Orbits: Visualized paranasal sinuses are clear. Orbits appear symmetric bilaterally. Other: Mastoid air cells are clear. IMPRESSION: 1. Apparent vasogenic edema in the right frontal lobe region in the mid to superior aspect of the right frontal lobe mild effacement of the anterior horn the right lateral ventricle. Concern for recurrent mass in this area. Advise correlation with brain MRI pre and post-contrast to further evaluate. 2. Elsewhere brain parenchyma appears unremarkable. No acute infarct appreciable the study. No hemorrhage evident. 3.  Status post right frontal craniotomy. 4.  Mild arterial vascular calcification noted. Electronically Signed   By: Lowella Grip III M.D.   On: 11/27/2020 12:26   MR Brain W and Wo Contrast  Result Date: 11/27/2020 CLINICAL DATA:  Glioblastoma. EXAM: MRI HEAD WITHOUT AND WITH CONTRAST TECHNIQUE: Multiplanar, multiecho pulse sequences of the brain and surrounding structures were obtained without and with intravenous contrast. CONTRAST:  5.45m GADAVIST GADOBUTROL 1 MMOL/ML IV SOLN COMPARISON:  Head CT 11/27/2020 and MRI 09/06/2020 FINDINGS: Brain: Sequelae of right frontal craniotomy and tumor resection  are again identified. A resection cavity in the parasagittal right frontal lobe contains chronic blood products and has collapsed since the prior MRI. There is a new 2.0 x 1.3 x 1.9 cm right frontal lesion located immediately lateral and superior to the collapsed resection cavity which demonstrates thick peripheral enhancement with enhancement extending medially to the falx. Extensive nonenhancing T2 hyperintensity in the surrounding right frontal white matter is greatly increased from the prior MRI with associated gyral widening and sulcal effacement. There is slight mass effect on the right lateral ventricle without significant midline shift. Elsewhere, no acute infarct, acute intracranial hemorrhage, or extra-axial fluid collection is identified. There is no hydrocephalus. Vascular: Major intracranial vascular flow voids are preserved. Skull and upper cervical spine: Right frontal craniotomy with mild underlying dural enhancement. Sinuses/Orbits: Unremarkable orbits. Mild mucosal thickening in the right sphenoid sinus. Clear mastoid air cells. Other: None. IMPRESSION: New  2 cm ring-enhancing right frontal lesion with extensive surrounding edema. This may reflect post radiation changes/radiation necrosis or progressive tumor. Electronically Signed   By: Logan Bores M.D.   On: 11/27/2020 17:14   DG Chest Portable 1 View  Result Date: 11/27/2020 CLINICAL DATA:  Fatigue and weakness, initial encounter EXAM: PORTABLE CHEST 1 VIEW COMPARISON:  None. FINDINGS: The heart size and mediastinal contours are within normal limits. Both lungs are clear. The visualized skeletal structures are unremarkable. IMPRESSION: No active disease. Electronically Signed   By: Inez Catalina M.D.   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  There is a concurrent pyschosomatic syndrome as  well, as exam demonstrates much better strength than reported subjectively.  She acknowledge significant anxiety and fear regarding her cancer diagnosis and functional impairments.  MRI demonstrates progressive changes most likely consistent with post-radiation inflammatory process.  We recommended continuing decadron, but decreasing dose to 45m in AM, 425min PM.  Ok with home PT, OT and nursing services, will order.   Will place social work referral as well.  Should continue Keppra 50032mID.  We will touch base with her in 1 week via phone to assess response to interventions today, and continue adjusting decadron.  All questions were answered. The patient knows to call the clinic with any problems, questions or concerns. No barriers to learning were detected.  I have spent a total of 40 minutes of face-to-face and non-face-to-face time, excluding clinical staff time, preparing to see patient, ordering tests and/or medications, counseling the patient, and independently interpreting results and communicating results to the patient/family/caregiver    ZacVentura SellersD Medical Director of Neuro-Oncology [facility] WesShubert/30/22 1:59 PM

## 2020-11-29 ENCOUNTER — Other Ambulatory Visit (HOSPITAL_COMMUNITY): Payer: Self-pay

## 2020-12-03 ENCOUNTER — Other Ambulatory Visit: Payer: Self-pay | Admitting: *Deleted

## 2020-12-03 DIAGNOSIS — C711 Malignant neoplasm of frontal lobe: Secondary | ICD-10-CM

## 2020-12-03 NOTE — Progress Notes (Signed)
Home health orders added.  [facility] called.

## 2020-12-04 ENCOUNTER — Telehealth: Payer: Self-pay | Admitting: Internal Medicine

## 2020-12-04 NOTE — Telephone Encounter (Signed)
Called patient regarding 07/07 phone visit, patient is notified.

## 2020-12-05 ENCOUNTER — Other Ambulatory Visit (HOSPITAL_COMMUNITY): Payer: Self-pay

## 2020-12-05 ENCOUNTER — Inpatient Hospital Stay: Payer: Medicare Other | Attending: Internal Medicine | Admitting: Internal Medicine

## 2020-12-05 DIAGNOSIS — C711 Malignant neoplasm of frontal lobe: Secondary | ICD-10-CM | POA: Diagnosis not present

## 2020-12-05 DIAGNOSIS — G40909 Epilepsy, unspecified, not intractable, without status epilepticus: Secondary | ICD-10-CM

## 2020-12-05 NOTE — Progress Notes (Signed)
I connected with Darlene Marsh on 12/05/20 at  2:30 PM EDT by telephone visit and verified that I am speaking with the correct person using two identifiers.  I discussed the limitations, risks, security and privacy concerns of performing an evaluation and management service by telemedicine and the availability of in-person appointments. I also discussed with the patient that there may be a patient responsible charge related to this service. The patient expressed understanding and agreed to proceed.  Other persons participating in the visit and their role in the encounter:  n/a  Patient's location:  Home  Provider's location:  Office  Chief Complaint:  Glioblastoma multiforme of frontal lobe ([place])  Seizure disorder ([place])  History of Present Ilness: Darlene Marsh describes significant improvement in functional status since our visit last week, increasing the steroids.  She is still requiring assistance with day to day activities, but is increasingly engaged and participatory.  No other new or progressive complaints, decadron is at 8mg  AM, 4mg  PM. Observations: Language and cognition at baseline Assessment and Plan: Glioblastoma multiforme of frontal lobe ([facility])  Seizure disorder ([facility])  Clinically improved.  Can decrease decadron to 4mg /4mg  x7 days, then 4mg /2mg  x7 days.   Follow Up Instructions: RTC as scheduled after MRI later this month  I discussed the assessment and treatment plan with the patient.  The patient was provided an opportunity to ask questions and all were answered.  The patient agreed with the plan and demonstrated understanding of the instructions.    The patient was advised to call back or seek an in-person evaluation if the symptoms worsen or if the condition fails to improve as anticipated.  I provided 5-10 minutes of non-face-to-face time during this enocunter.  Ventura Sellers, MD   I provided 15 minutes of non face-to-face telephone visit time during this encounter,  and > 50% was spent counseling as documented under my assessment & plan.

## 2020-12-07 DIAGNOSIS — E039 Hypothyroidism, unspecified: Secondary | ICD-10-CM | POA: Diagnosis not present

## 2020-12-07 DIAGNOSIS — R569 Unspecified convulsions: Secondary | ICD-10-CM | POA: Diagnosis not present

## 2020-12-07 DIAGNOSIS — E785 Hyperlipidemia, unspecified: Secondary | ICD-10-CM | POA: Diagnosis not present

## 2020-12-07 DIAGNOSIS — G8194 Hemiplegia, unspecified affecting left nondominant side: Secondary | ICD-10-CM | POA: Diagnosis not present

## 2020-12-07 DIAGNOSIS — C711 Malignant neoplasm of frontal lobe: Secondary | ICD-10-CM | POA: Diagnosis not present

## 2020-12-07 DIAGNOSIS — I10 Essential (primary) hypertension: Secondary | ICD-10-CM | POA: Diagnosis not present

## 2020-12-07 DIAGNOSIS — G252 Other specified forms of tremor: Secondary | ICD-10-CM | POA: Diagnosis not present

## 2020-12-07 DIAGNOSIS — Z9181 History of falling: Secondary | ICD-10-CM | POA: Diagnosis not present

## 2020-12-09 ENCOUNTER — Other Ambulatory Visit (HOSPITAL_COMMUNITY): Payer: Self-pay

## 2020-12-10 DIAGNOSIS — E785 Hyperlipidemia, unspecified: Secondary | ICD-10-CM | POA: Diagnosis not present

## 2020-12-10 DIAGNOSIS — C711 Malignant neoplasm of frontal lobe: Secondary | ICD-10-CM | POA: Diagnosis not present

## 2020-12-10 DIAGNOSIS — G252 Other specified forms of tremor: Secondary | ICD-10-CM | POA: Diagnosis not present

## 2020-12-10 DIAGNOSIS — I10 Essential (primary) hypertension: Secondary | ICD-10-CM | POA: Diagnosis not present

## 2020-12-10 DIAGNOSIS — R569 Unspecified convulsions: Secondary | ICD-10-CM | POA: Diagnosis not present

## 2020-12-10 DIAGNOSIS — G8194 Hemiplegia, unspecified affecting left nondominant side: Secondary | ICD-10-CM | POA: Diagnosis not present

## 2020-12-11 DIAGNOSIS — C711 Malignant neoplasm of frontal lobe: Secondary | ICD-10-CM | POA: Diagnosis not present

## 2020-12-11 DIAGNOSIS — E785 Hyperlipidemia, unspecified: Secondary | ICD-10-CM | POA: Diagnosis not present

## 2020-12-11 DIAGNOSIS — R569 Unspecified convulsions: Secondary | ICD-10-CM | POA: Diagnosis not present

## 2020-12-11 DIAGNOSIS — G252 Other specified forms of tremor: Secondary | ICD-10-CM | POA: Diagnosis not present

## 2020-12-11 DIAGNOSIS — I10 Essential (primary) hypertension: Secondary | ICD-10-CM | POA: Diagnosis not present

## 2020-12-11 DIAGNOSIS — G8194 Hemiplegia, unspecified affecting left nondominant side: Secondary | ICD-10-CM | POA: Diagnosis not present

## 2020-12-12 DIAGNOSIS — R569 Unspecified convulsions: Secondary | ICD-10-CM | POA: Diagnosis not present

## 2020-12-12 DIAGNOSIS — G8194 Hemiplegia, unspecified affecting left nondominant side: Secondary | ICD-10-CM | POA: Diagnosis not present

## 2020-12-12 DIAGNOSIS — C711 Malignant neoplasm of frontal lobe: Secondary | ICD-10-CM | POA: Diagnosis not present

## 2020-12-12 DIAGNOSIS — G252 Other specified forms of tremor: Secondary | ICD-10-CM | POA: Diagnosis not present

## 2020-12-12 DIAGNOSIS — E785 Hyperlipidemia, unspecified: Secondary | ICD-10-CM | POA: Diagnosis not present

## 2020-12-12 DIAGNOSIS — I10 Essential (primary) hypertension: Secondary | ICD-10-CM | POA: Diagnosis not present

## 2020-12-13 ENCOUNTER — Telehealth: Payer: Self-pay | Admitting: *Deleted

## 2020-12-13 NOTE — Telephone Encounter (Signed)
Received vm message from patient's daughter, christine.  She is asking to talk to Dr. Mickeal Skinner as her mother is having some behavior concerns. She states her mother is aacting erratically, can't sit still and is having very bad mood swings and is yelling and cussing at her family. She states she knew her mother would have mood swings but didn't think they would be as bad as they are. [name] states she has decreased dexamethasone to 1 daily for the next week then plans to decrease to 1/2 tablet the following week.  She would like a call back to talk about this and then to know what to do to help her mother.

## 2020-12-16 DIAGNOSIS — G252 Other specified forms of tremor: Secondary | ICD-10-CM | POA: Diagnosis not present

## 2020-12-16 DIAGNOSIS — I10 Essential (primary) hypertension: Secondary | ICD-10-CM | POA: Diagnosis not present

## 2020-12-16 DIAGNOSIS — G8194 Hemiplegia, unspecified affecting left nondominant side: Secondary | ICD-10-CM | POA: Diagnosis not present

## 2020-12-16 DIAGNOSIS — E785 Hyperlipidemia, unspecified: Secondary | ICD-10-CM | POA: Diagnosis not present

## 2020-12-16 DIAGNOSIS — C711 Malignant neoplasm of frontal lobe: Secondary | ICD-10-CM | POA: Diagnosis not present

## 2020-12-16 DIAGNOSIS — R569 Unspecified convulsions: Secondary | ICD-10-CM | POA: Diagnosis not present

## 2020-12-18 ENCOUNTER — Other Ambulatory Visit: Payer: Self-pay | Admitting: Radiation Therapy

## 2020-12-19 DIAGNOSIS — E785 Hyperlipidemia, unspecified: Secondary | ICD-10-CM | POA: Diagnosis not present

## 2020-12-19 DIAGNOSIS — I10 Essential (primary) hypertension: Secondary | ICD-10-CM | POA: Diagnosis not present

## 2020-12-19 DIAGNOSIS — R569 Unspecified convulsions: Secondary | ICD-10-CM | POA: Diagnosis not present

## 2020-12-19 DIAGNOSIS — C711 Malignant neoplasm of frontal lobe: Secondary | ICD-10-CM | POA: Diagnosis not present

## 2020-12-19 DIAGNOSIS — G8194 Hemiplegia, unspecified affecting left nondominant side: Secondary | ICD-10-CM | POA: Diagnosis not present

## 2020-12-19 DIAGNOSIS — G252 Other specified forms of tremor: Secondary | ICD-10-CM | POA: Diagnosis not present

## 2020-12-20 ENCOUNTER — Ambulatory Visit
Admission: RE | Admit: 2020-12-20 | Discharge: 2020-12-20 | Disposition: A | Discharge status: Home / Self Care | Payer: Medicare Other | Source: Ambulatory Visit | Attending: Internal Medicine | Admitting: Internal Medicine

## 2020-12-20 ENCOUNTER — Other Ambulatory Visit: Payer: Self-pay

## 2020-12-20 DIAGNOSIS — C711 Malignant neoplasm of frontal lobe: Secondary | ICD-10-CM

## 2020-12-20 DIAGNOSIS — G9389 Other specified disorders of brain: Secondary | ICD-10-CM | POA: Diagnosis not present

## 2020-12-20 DIAGNOSIS — C729 Malignant neoplasm of central nervous system, unspecified: Secondary | ICD-10-CM | POA: Diagnosis not present

## 2020-12-20 MED ORDER — GADOBENATE DIMEGLUMINE 529 MG/ML IV SOLN
11.0000 mL | Freq: Once | INTRAVENOUS | Status: AC | PRN
  Administered 2020-12-20: 11 mL via INTRAVENOUS

## 2020-12-23 ENCOUNTER — Encounter: Payer: Medicare Other | Admitting: Physical Medicine and Rehabilitation

## 2020-12-23 ENCOUNTER — Ambulatory Visit: Payer: Medicare Other | Admitting: Internal Medicine

## 2020-12-23 ENCOUNTER — Inpatient Hospital Stay (HOSPITAL_BASED_OUTPATIENT_CLINIC_OR_DEPARTMENT_OTHER): Payer: Medicare Other | Admitting: Internal Medicine

## 2020-12-23 ENCOUNTER — Other Ambulatory Visit (HOSPITAL_COMMUNITY): Payer: Self-pay

## 2020-12-23 ENCOUNTER — Other Ambulatory Visit: Payer: Self-pay

## 2020-12-23 ENCOUNTER — Inpatient Hospital Stay: Payer: Medicare Other

## 2020-12-23 VITALS — BP 117/66 | HR 63 | Temp 96.5°F | Resp 18 | Wt 114.2 lb

## 2020-12-23 DIAGNOSIS — G40909 Epilepsy, unspecified, not intractable, without status epilepticus: Secondary | ICD-10-CM

## 2020-12-23 DIAGNOSIS — C711 Malignant neoplasm of frontal lobe: Secondary | ICD-10-CM

## 2020-12-23 LAB — CBC WITH DIFFERENTIAL (CANCER CENTER ONLY)
Abs Immature Granulocytes: 0.04 10*3/uL (ref 0.00–0.07)
Basophils Absolute: 0 10*3/uL (ref 0.0–0.1)
Basophils Relative: 0 %
Eosinophils Absolute: 0 10*3/uL (ref 0.0–0.5)
Eosinophils Relative: 0 %
HCT: 34.2 % — ABNORMAL LOW (ref 36.0–46.0)
Hemoglobin: 12.4 g/dL (ref 12.0–15.0)
Immature Granulocytes: 1 %
Lymphocytes Relative: 5 %
Lymphs Abs: 0.3 10*3/uL — ABNORMAL LOW (ref 0.7–4.0)
MCH: 33.5 pg (ref 26.0–34.0)
MCHC: 36.3 g/dL — ABNORMAL HIGH (ref 30.0–36.0)
MCV: 92.4 fL (ref 80.0–100.0)
Monocytes Absolute: 0.2 10*3/uL (ref 0.1–1.0)
Monocytes Relative: 3 %
Neutro Abs: 6 10*3/uL (ref 1.7–7.7)
Neutrophils Relative %: 91 %
Platelet Count: 205 10*3/uL (ref 150–400)
RBC: 3.7 MIL/uL — ABNORMAL LOW (ref 3.87–5.11)
RDW: 11.9 % (ref 11.5–15.5)
WBC Count: 6.6 10*3/uL (ref 4.0–10.5)
nRBC: 0 % (ref 0.0–0.2)

## 2020-12-23 LAB — CMP (CANCER CENTER ONLY)
ALT: 14 U/L (ref 0–44)
AST: 11 U/L — ABNORMAL LOW (ref 15–41)
Albumin: 2.7 g/dL — ABNORMAL LOW (ref 3.5–5.0)
Alkaline Phosphatase: 80 U/L (ref 38–126)
Anion gap: 8 (ref 5–15)
BUN: 8 mg/dL (ref 8–23)
CO2: 27 mmol/L (ref 22–32)
Calcium: 8.7 mg/dL — ABNORMAL LOW (ref 8.9–10.3)
Chloride: 103 mmol/L (ref 98–111)
Creatinine: 0.58 mg/dL (ref 0.44–1.00)
GFR, Estimated: >60 mL/min (ref >60)
Glucose, Bld: 108 mg/dL — ABNORMAL HIGH (ref 70–99)
Potassium: 3.9 mmol/L (ref 3.5–5.1)
Sodium: 138 mmol/L (ref 135–145)
Total Bilirubin: 0.9 mg/dL (ref 0.3–1.2)
Total Protein: 6.1 g/dL — ABNORMAL LOW (ref 6.5–8.1)

## 2020-12-23 MED ORDER — ONDANSETRON HCL 8 MG PO TABS
8.0000 mg | ORAL_TABLET | Freq: Two times a day (BID) | ORAL | 1 refills | Status: DC | PRN
  Filled 2020-12-23 – 2020-12-24 (×2): qty 30, 15d supply, fill #0

## 2020-12-23 MED ORDER — TEMOZOLOMIDE 100 MG PO CAPS
200.0000 mg | ORAL_CAPSULE | Freq: Every day | ORAL | 0 refills | Status: DC
  Filled 2020-12-23: qty 10, 5d supply, fill #0
  Filled 2020-12-24 (×2): qty 10, 28d supply, fill #0

## 2020-12-23 MED ORDER — DEXAMETHASONE 1 MG PO TABS
1.0000 mg | ORAL_TABLET | Freq: Every day | ORAL | 1 refills | Status: DC
  Filled 2020-12-23 – 2020-12-24 (×2): qty 30, 30d supply, fill #0

## 2020-12-23 MED ORDER — TEMOZOLOMIDE 20 MG PO CAPS
20.0000 mg | ORAL_CAPSULE | Freq: Every day | ORAL | 0 refills | Status: DC
  Filled 2020-12-23: qty 14, 14d supply, fill #0
  Filled 2020-12-24 (×2): qty 5, 28d supply, fill #0

## 2020-12-23 NOTE — Progress Notes (Signed)
  Radiation Oncology         [phone] ________________________________  Name: Darlene Marsh MRN: [id number]  Date of Service: 12/24/2020  DOB: [birth year]  Post Treatment Telephone Note  Diagnosis:   Glioblastoma of the frontal lobe.  Interval Since Last Radiation:  5 weeks   10/07/2020 through 11/19/2020 Site Technique Total Dose (Gy) Dose per Fx (Gy) Completed Fx Beam Energies  Brain: Brain IMRT 46/46 2 23/23 6X  Brain: Brain_Bst IMRT 14/14 2 7/7 6X    Narrative:  The patient was contacted today for routine follow-up. During treatment she did very well with radiotherapy and did not have significant desquamation.   Impression/Plan: 1. Glioblastoma of the frontal lobe. I was unable to reach the patient but left a message for her daughter to see if they had questions regarding radiation. We discussed that we would be happy to continue to follow her as needed, but she will also continue to follow up with Dr. Mickeal Skinner in neuro oncology.       Carola Rhine, PAC

## 2020-12-23 NOTE — Progress Notes (Signed)
[facility] [place], [place] [address] [phone]   Interval Evaluation  Date of Service: 12/23/20 Patient Name: Darlene Marsh Patient MRN: [id number] Patient DOB: [birth year] Provider: Ventura Sellers, MD  Identifying Statement:  Darlene Marsh is a 70 y.o. female with right frontal glioblastoma   Oncologic History: Oncology History  Glioblastoma multiforme of frontal lobe ([place])  09/05/2020 Surgery   Craniotomy, resection with Dr. Marcello Marsh; path demonstrates Glioblastoma IDH-wt   10/07/2020 -  Chemotherapy    Patient is on Treatment Plan: BRAIN GLIOBLASTOMA RADIATION THERAPY WITH CONCURRENT TEMOZOLOMIDE 75 MG/M2 DAILY FOLLOWED BY SEQUENTIAL MAINTENANCE TEMOZOLOMIDE X 6-12 CYCLES        Dexamethasone 11/11/20: 23m 11/14/20: 443m06/23/22: 3m58m6/27/22: 13m9mbrupt clinical changes) 12/05/20: 8mg 74m14/22: 6mg 033m5/22: 3mg  B19markers:  MGMT Unknown.  IDH 1/2 Wild type.  EGFR Unknown  TERT Unknown   Interval History:  Darlene Marsh today for follow up after recent MRI brain, now 1 month removed from radiation.  She describes no recurrence of weakness episodes described last month.  No recurrence of seizures.  Overall in good spirits, enjoyed month free from radiation therapy.  H+P (09/26/20) Patient presented to medical attention earlier this month with new onset seizure episode, characterized as "left arm shaking and tremoring, with some involvement of the leg".  Prior to this she had no complaints, and was independent.  CNS imaging demonstrated nodularly enhancing right frontal mass.  She underwent craniotomy, resection with Dr. Thomas Darlene Marsh/22; path demonstrated glioblastoma IDH-wt.  Following surgery, she experienced some left sided weakness and was discharged for inpatient rehab course.  After discharge this past week, she is close to her baseline, still using a walker for some ambulation.  Otherwise no  additional seizures, no headaches.  Medications: Current Outpatient Medications on File Prior to Visit  Medication Sig Dispense Refill   acetaminophen-codeine (TYLENOL #3) 300-30 MG tablet Take 1 tablet by mouth every 12 (twelve) hours as needed for severe pain. (Patient not taking: No sig reported) 14 tablet 0   dexamethasone (DECADRON) 4 MG tablet Take 1 tablet (4 mg total) by mouth daily. 60 tablet 1   levETIRAcetam (KEPPRA) 500 MG tablet Take 1 tablet (500 mg total) by mouth 2 (two) times daily. 60 tablet 0   ondansetron (ZOFRAN) 8 MG tablet Take 1 tablet (8 mg total) by mouth 2 (two) times daily as needed (nausea and vomiting). May take 30-60 minutes prior to Temodar administration if nausea/vomiting occurs. 30 tablet 1   temozolomide (TEMODAR) 100 MG capsule Take 1 capsule (100 mg total) by mouth daily. May take on an empty stomach to decrease nausea & vomiting. (Patient not taking: No sig reported) 42 capsule 0   temozolomide (TEMODAR) 20 MG capsule Take 1 capsule (20 mg total) by mouth daily. May take on an empty stomach to decrease nausea & vomiting. (Patient not taking: No sig reported) 42 capsule 0   No current facility-administered medications on file prior to visit.    Allergies: No Known Allergies Past Medical History:  Past Medical History:  Diagnosis Date   Hyperlipidemia    Hypertension    Thyroid disease    underactive   Tremors of nervous system    Past Surgical History:  Past Surgical History:  Procedure Laterality Date   APPLICATION OF CRANIAL NAVIGATION Right 09/05/2020   Procedure: APPLICATION OF CRANIAL NAVIGATION;  Surgeon: Thomas,Vallarie MareLocation: [facility]; [place]  [place]uro[place];  Laterality: Right;   CRANIOTOMY Right 09/05/2020   Procedure: Craniotomy - right Frontal interhemispheric approach for tumor resection with BRAIN LAB;  Surgeon: Vallarie Mare, MD;  Location: [facility];  Service: Neurosurgery;  Laterality: Right;   Social History:  Social  History   Socioeconomic History   Marital status: Married    Spouse name: Not on file   Number of children: Not on file   Years of education: Not on file   Highest education level: Not on file  Occupational History   Not on file  Tobacco Use   Smoking status: Never   Smokeless tobacco: Never  Vaping Use   Vaping Use: Never used  Substance and Sexual Activity   Alcohol use: Never   Drug use: Never   Sexual activity: Not on file  Other Topics Concern   Not on file  Social History Narrative   Not on file   Social Determinants of Health   Financial Resource Strain: Not on file  Food Insecurity: Not on file  Transportation Needs: Not on file  Physical Activity: Not on file  Stress: Not on file  Social Connections: Not on file  Intimate Partner Violence: Not on file   Family History:  Family History  Problem Relation Age of Onset   Cancer Mother        Breast cancer   Cancer Sister        Breast   Cancer Sister        Breast    Review of Systems: Constitutional: Doesn't report fevers, chills or abnormal weight loss Eyes: Doesn't report blurriness of vision Ears, nose, mouth, throat, and face: Doesn't report sore throat Respiratory: Doesn't report cough, dyspnea or wheezes Cardiovascular: Doesn't report palpitation, chest discomfort  Gastrointestinal:  Doesn't report nausea, constipation, diarrhea GU: Doesn't report incontinence Skin: Doesn't report skin rashes Neurological: Per HPI Musculoskeletal: Doesn't report joint pain Behavioral/Psych: Doesn't report anxiety  Physical Exam: Vitals:   12/23/20 1447  BP: 117/66  Pulse: 63  Resp: 18  Temp: (!) 96.5 F (35.8 C)  SpO2: 98%    KPS: 80. General: Alert, cooperative, pleasant, in no acute distress Head: Normal EENT: No conjunctival injection or scleral icterus.  Lungs: Resp effort normal Cardiac: Regular rate Abdomen: Non-distended abdomen Skin: No rashes cyanosis or petechiae. Extremities: Left  foot lateralized edema  Neurologic Exam: Mental Status: Awake, alert, attentive to examiner. Oriented to self and environment. Language is fluent with intact comprehension.  Cranial Nerves: Visual acuity is grossly normal. Visual fields are full. Extra-ocular movements intact. No ptosis. Face is symmetric Motor: Tone and bulk are normal. Left arm and leg 4+/5. Right sided weakness is functional in nature.  Reflexes are symmetric, no pathologic reflexes present.  Sensory: Intact to light touch Gait: Deferred   Labs: I have reviewed the data as listed    Component Value Date/Time   NA 140 11/27/2020 1014   K 3.7 11/27/2020 1014   CL 103 11/27/2020 1014   CO2 30 11/27/2020 1014   GLUCOSE 128 (H) 11/27/2020 1014   BUN 16 11/27/2020 1014   CREATININE 0.55 11/27/2020 1014   CREATININE 0.78 11/11/2020 0951   CREATININE 0.74 03/15/2019 0817   CALCIUM 9.5 11/27/2020 1014   PROT 7.6 11/27/2020 1014   ALBUMIN 4.6 11/27/2020 1014   AST 15 11/27/2020 1014   AST 17 11/11/2020 0951   ALT 18 11/27/2020 1014   ALT 27 11/11/2020 0951   ALKPHOS 69 11/27/2020 1014  BILITOT 1.5 (H) 11/27/2020 1014   BILITOT 0.8 11/11/2020 0951   GFRNONAA >60 11/27/2020 1014   GFRNONAA >60 11/11/2020 0951   GFRNONAA 75 01/19/2018 0939   GFRAA 87 01/19/2018 0939   Lab Results  Component Value Date   WBC 6.6 12/23/2020   NEUTROABS 6.0 12/23/2020   HGB 12.4 12/23/2020   HCT 34.2 (L) 12/23/2020   MCV 92.4 12/23/2020   PLT 205 12/23/2020   Imaging:  [facility] Clinician Interpretation: I have personally reviewed the CNS images as listed.  My interpretation, in the context of the patient's clinical presentation, is treatment effect vs true progression  CT Head Wo Contrast  Result Date: 11/27/2020 CLINICAL DATA:  Dizziness. Previous craniotomy for right frontal brain tumor EXAM: CT HEAD WITHOUT CONTRAST TECHNIQUE: Contiguous axial images were obtained from the base of the skull through the vertex without  intravenous contrast. COMPARISON:  Brain MRI April 2022; head CT August 30, 2020 FINDINGS: Brain: The ventricles and sulci overall are normal in size and configuration. There is vasogenic appearing edema in the mid to upper right frontal lobe anteriorly with mild effacement of a portion of the anterior horn the right lateral ventricle. Go similar edema elsewhere. No well-defined mass on noncontrast enhanced study. No evident hemorrhage. No extra-axial fluid collection or midline shift. Elsewhere brain parenchyma appears unremarkable. Vascular: No hyperdense vessel. There is calcification in the left carotid siphon region. Skull: Patient is status post superior right frontal craniotomy. Bony calvarium otherwise intact. Sinuses/Orbits: Visualized paranasal sinuses are clear. Orbits appear symmetric bilaterally. Other: Mastoid air cells are clear. IMPRESSION: 1. Apparent vasogenic edema in the right frontal lobe region in the mid to superior aspect of the right frontal lobe mild effacement of the anterior horn the right lateral ventricle. Concern for recurrent mass in this area. Advise correlation with brain MRI pre and post-contrast to further evaluate. 2. Elsewhere brain parenchyma appears unremarkable. No acute infarct appreciable the study. No hemorrhage evident. 3.  Status post right frontal craniotomy. 4.  Mild arterial vascular calcification noted. Electronically Signed   By: Lowella Grip III M.D.   On: 11/27/2020 12:26   MR BRAIN W WO CONTRAST  Result Date: 12/21/2020 CLINICAL DATA:  Brain/CNS neoplasm, assess treatment response EXAM: MRI HEAD WITHOUT AND WITH CONTRAST TECHNIQUE: Multiplanar, multiecho pulse sequences of the brain and surrounding structures were obtained without and with intravenous contrast. CONTRAST:  38m MULTIHANCE GADOBENATE DIMEGLUMINE 529 MG/ML IV SOLN COMPARISON:  None. FINDINGS: Brain: Superior right frontal lobe peripherally enhancing lesion measures 2.2 x 1.7 cm, previously  2.0 x 1.4 cm. The degree of hyperintense T2-weighted signal surrounding the lesion has decreased. No acute or chronic hemorrhage. Normal white matter signal, parenchymal volume and CSF spaces. The midline structures are normal. Vascular: Major flow voids are preserved. Skull and upper cervical spine: Normal calvarium and skull base. Visualized upper cervical spine and soft tissues are normal. Sinuses/Orbits:No paranasal sinus fluid levels or advanced mucosal thickening. No mastoid or middle ear effusion. Normal orbits. IMPRESSION: Mixed post-treatment change with increased size of right frontal lobe peripherally enhancing lesion, but decreased surrounding hyperintense T2-weighted signal. Pseudoprogression favored. Electronically Signed   By: KUlyses JarredM.D.   On: 12/21/2020 00:43   MR Brain W and Wo Contrast  Result Date: 11/27/2020 CLINICAL DATA:  Glioblastoma. EXAM: MRI HEAD WITHOUT AND WITH CONTRAST TECHNIQUE: Multiplanar, multiecho pulse sequences of the brain and surrounding structures were obtained without and with intravenous contrast. CONTRAST:  5.565mGADAVIST GADOBUTROL 1 MMOL/ML IV SOLN  COMPARISON:  Head CT 11/27/2020 and MRI 09/06/2020 FINDINGS: Brain: Sequelae of right frontal craniotomy and tumor resection are again identified. A resection cavity in the parasagittal right frontal lobe contains chronic blood products and has collapsed since the prior MRI. There is a new 2.0 x 1.3 x 1.9 cm right frontal lesion located immediately lateral and superior to the collapsed resection cavity which demonstrates thick peripheral enhancement with enhancement extending medially to the falx. Extensive nonenhancing T2 hyperintensity in the surrounding right frontal white matter is greatly increased from the prior MRI with associated gyral widening and sulcal effacement. There is slight mass effect on the right lateral ventricle without significant midline shift. Elsewhere, no acute infarct, acute intracranial  hemorrhage, or extra-axial fluid collection is identified. There is no hydrocephalus. Vascular: Major intracranial vascular flow voids are preserved. Skull and upper cervical spine: Right frontal craniotomy with mild underlying dural enhancement. Sinuses/Orbits: Unremarkable orbits. Mild mucosal thickening in the right sphenoid sinus. Clear mastoid air cells. Other: None. IMPRESSION: New 2 cm ring-enhancing right frontal lesion with extensive surrounding edema. This may reflect post radiation changes/radiation necrosis or progressive tumor. Electronically Signed   By: Logan Bores M.D.   On: 11/27/2020 17:14   DG Chest Portable 1 View  Result Date: 11/27/2020 CLINICAL DATA:  Fatigue and weakness, initial encounter EXAM: PORTABLE CHEST 1 VIEW COMPARISON:  None. FINDINGS: The heart size and mediastinal contours are within normal limits. Both lungs are clear. The visualized skeletal structures are unremarkable. IMPRESSION: No active disease. Electronically Signed   By: Inez Catalina M.D.   On: 11/27/2020 11:40     Assessment/Plan Glioblastoma multiforme of frontal lobe ([place]) [C71.1]  Matsuko Kretz is clinically stable today, now having completed 6 weeks IMRT and Temodar.  MRI demonstrates relative stability of enhancing mass, and clear improvement in burden of inflammation visualized last month.    We recommended initiating treatment with Temozolomide 150 mg/m2, on for five days and off for twenty three days in twenty eight day cycles. The patient will have a complete blood count performed on days 21 and 28 of each cycle, and a comprehensive metabolic panel performed on day 28 of each cycle. Labs may need to be performed more often. Zofran will prescribed for home use for nausea/vomiting.   Informed consent was obtained verbally at bedside to proceed with oral chemotherapy.  Chemotherapy should be held for the following:  ANC less than 1,000  Platelets less than 100,000  LFT or creatinine greater  than 2x ULN  If clinical concerns/contraindications develop  We recommended decreasing decadron to 72m daily.  Should continue Keppra 5083mBID.  We ask that PeShiva Kariseturn to clinic in 1 months with labs for evaluation prior to cycle #2, or sooner as needed.  All questions were answered. The patient knows to call the clinic with any problems, questions or concerns. No barriers to learning were detected.  I have spent a total of 40 minutes of face-to-face and non-face-to-face time, excluding clinical staff time, preparing to see patient, ordering tests and/or medications, counseling the patient, and independently interpreting results and communicating results to the patient/family/caregiver    ZaVentura SellersMD Medical Director of Neuro-Oncology [facility] We[facility]ity7/25/22 2:46 PM

## 2020-12-23 NOTE — Progress Notes (Signed)
DISCONTINUE ON PATHWAY REGIMEN - Neuro     One cycle, concurrent with RT:     Temozolomide   **Always confirm dose/schedule in your pharmacy ordering system**  REASON: Continuation Of Treatment PRIOR TREATMENT: [id number]: Radiation Therapy with Concurrent Temozolomide 75 mg/m2 Daily x 6 Weeks, Followed by Adjuvant Temozolomide TREATMENT RESPONSE: Stable Disease (SD)  START ON PATHWAY REGIMEN - Neuro     A cycle is every 28 days:     Temozolomide      Temozolomide   **Always confirm dose/schedule in your pharmacy ordering system**  Patient Characteristics: Glioblastoma (Grade 4 Glioma), Newly Diagnosed / Treatment Naive, Good Performance Status and/or Younger Patient, MGMT Promoter Unmethylated/Unknown Disease Classification: Glioma Disease Classification: Glioblastoma (Grade 4 Glioma) Disease Status: Newly Diagnosed / Treatment Naive Performance Status: Good Performance Status and/or Younger Patient MGMT Promoter Methylation Status: Awaiting Test Results Intent of Therapy: Non-Curative / Palliative Intent, Discussed with Patient

## 2020-12-24 ENCOUNTER — Other Ambulatory Visit (HOSPITAL_COMMUNITY): Payer: Self-pay

## 2020-12-24 ENCOUNTER — Ambulatory Visit
Admission: RE | Admit: 2020-12-24 | Discharge: 2020-12-24 | Disposition: A | Discharge status: Home / Self Care | Payer: Medicare Other | Source: Ambulatory Visit | Attending: Internal Medicine | Admitting: Internal Medicine

## 2020-12-24 ENCOUNTER — Telehealth: Payer: Self-pay | Admitting: Internal Medicine

## 2020-12-24 ENCOUNTER — Telehealth: Payer: Self-pay | Admitting: Pharmacist

## 2020-12-24 ENCOUNTER — Encounter: Payer: Self-pay | Admitting: Internal Medicine

## 2020-12-24 ENCOUNTER — Telehealth: Payer: Self-pay

## 2020-12-24 DIAGNOSIS — I10 Essential (primary) hypertension: Secondary | ICD-10-CM | POA: Diagnosis not present

## 2020-12-24 DIAGNOSIS — R569 Unspecified convulsions: Secondary | ICD-10-CM | POA: Diagnosis not present

## 2020-12-24 DIAGNOSIS — C711 Malignant neoplasm of frontal lobe: Secondary | ICD-10-CM | POA: Diagnosis not present

## 2020-12-24 DIAGNOSIS — G252 Other specified forms of tremor: Secondary | ICD-10-CM | POA: Diagnosis not present

## 2020-12-24 DIAGNOSIS — G8194 Hemiplegia, unspecified affecting left nondominant side: Secondary | ICD-10-CM | POA: Diagnosis not present

## 2020-12-24 DIAGNOSIS — E785 Hyperlipidemia, unspecified: Secondary | ICD-10-CM | POA: Diagnosis not present

## 2020-12-24 NOTE — Telephone Encounter (Signed)
Scheduled appt per 7/25 sch msg. Pt's daughter is aware.

## 2020-12-24 NOTE — Telephone Encounter (Signed)
Oral Oncology Pharmacist Encounter  Received new prescription for Temodar (temozolomide) for the maintenance-phase treatment of glioblastoma multiforme, planned duration 6-12 months.  Prescription dose and frequency assessed for appropriateness. Appropriate for therapy initiation.   CMP and CBC w/ Diff from 12/23/20 assessed, no relevant lab abnormalities noted.  Current medication list in Epic reviewed, no relevant/significant DDIs with Temodar identified.  Evaluated chart and no patient barriers to medication adherence noted.   Patient agreement for treatment documented in MD note on 12/23/20.  Prescription has been e-scribed to the [facility] for benefits analysis and approval.  Oral Oncology Clinic will continue to follow for insurance authorization, copayment issues, initial counseling and start date.  Leron Croak, PharmD, BCPS Hematology/Oncology Clinical Pharmacist [facility] [phone] 12/24/2020 8:13 AM

## 2020-12-24 NOTE — Telephone Encounter (Signed)
Oral Oncology Patient Advocate Encounter  After completing a benefits investigation, prior authorization for Temodar is not required at this time through [employer].  Patient's copay is $15/each.    [place] Patient [place] Phone [phone] Fax [phone] 12/24/2020 8:40 AM

## 2020-12-25 ENCOUNTER — Ambulatory Visit: Payer: Medicare Other | Admitting: Internal Medicine

## 2020-12-25 ENCOUNTER — Other Ambulatory Visit (HOSPITAL_COMMUNITY): Payer: Self-pay

## 2020-12-25 ENCOUNTER — Other Ambulatory Visit: Payer: Medicare Other

## 2020-12-25 ENCOUNTER — Telehealth: Payer: Self-pay | Admitting: Radiation Oncology

## 2020-12-25 DIAGNOSIS — I10 Essential (primary) hypertension: Secondary | ICD-10-CM | POA: Diagnosis not present

## 2020-12-25 DIAGNOSIS — R569 Unspecified convulsions: Secondary | ICD-10-CM | POA: Diagnosis not present

## 2020-12-25 DIAGNOSIS — E785 Hyperlipidemia, unspecified: Secondary | ICD-10-CM | POA: Diagnosis not present

## 2020-12-25 DIAGNOSIS — G8194 Hemiplegia, unspecified affecting left nondominant side: Secondary | ICD-10-CM | POA: Diagnosis not present

## 2020-12-25 DIAGNOSIS — C711 Malignant neoplasm of frontal lobe: Secondary | ICD-10-CM | POA: Diagnosis not present

## 2020-12-25 DIAGNOSIS — G252 Other specified forms of tremor: Secondary | ICD-10-CM | POA: Diagnosis not present

## 2020-12-25 NOTE — Telephone Encounter (Signed)
The patient's daughter called back and we discussed a few questions about her treatment plans of the future.

## 2020-12-25 NOTE — Telephone Encounter (Signed)
Oral Chemotherapy Pharmacist Encounter  I spoke with patient's daughter for overview of: Temodar (temozolomide) for the maintenance treatment of glioblastoma multiforme, planned duration 6-12 months of treatment.  Counseled on administration, dosing, side effects, monitoring, drug-food interactions, safe handling, storage, and disposal.  Patient will take Temodar '20mg'$  capsules and Temodar '100mg'$  capsules, '220mg'$  total daily dose, by mouth once daily, may take at bedtime and on an empty stomach to decrease nausea and vomiting.  If 1st cycle is well tolerated, daughter informed that Temodar dose may be increased to 200 mg/m2 daily for 5 days on, 23 days off, repeated every 28 days for subsequent cycles   Patient will take Temodar daily for 5 days on, 23 days off, and repeated.  Temodar start date: 12/25/20   Patient will take Zofran '8mg'$  tablet, 1 tablet by mouth 30-60 min prior to Temodar dose to help decrease N/V.   Adverse effects include but are not limited to: nausea, vomiting, anorexia, GI upset, rash, drug fever, and fatigue. Rare but serious adverse effects of pneumocystis pneumonia and secondary malignancy also discussed.  We discussed strategies to manage constipation if they occur secondary to ondansetron dosing.  PCP prophylaxis will not be initiated at this time, but may be added based on lymphocyte count in the future.  Reviewed importance of keeping a medication schedule and plan for any missed doses. No barriers to medication adherence identified.  Medication reconciliation performed and medication/allergy list updated.  Insurance authorization for Temodar has been obtained. Patient's daughter will pick this up from the [facility] on 12/25/20.  Patient's daughter informed the pharmacy will reach out 5-7 days prior to needing next fill of Temodar to coordinate continued medication acquisition to prevent break in therapy.  All questions  answered.  Genevieve Norlander voiced understanding and appreciation.   Medication education handout placed in mail for patient. Patient's family knows to call the office with questions or concerns. Oral Chemotherapy Clinic phone number provided.   Leron Croak, PharmD, BCPS Hematology/Oncology Clinical Pharmacist [facility] [phone] 12/25/2020 10:48 AM

## 2020-12-26 DIAGNOSIS — C711 Malignant neoplasm of frontal lobe: Secondary | ICD-10-CM | POA: Diagnosis not present

## 2020-12-26 DIAGNOSIS — I10 Essential (primary) hypertension: Secondary | ICD-10-CM | POA: Diagnosis not present

## 2020-12-26 DIAGNOSIS — G8194 Hemiplegia, unspecified affecting left nondominant side: Secondary | ICD-10-CM | POA: Diagnosis not present

## 2020-12-26 DIAGNOSIS — E785 Hyperlipidemia, unspecified: Secondary | ICD-10-CM | POA: Diagnosis not present

## 2020-12-26 DIAGNOSIS — R569 Unspecified convulsions: Secondary | ICD-10-CM | POA: Diagnosis not present

## 2020-12-26 DIAGNOSIS — G252 Other specified forms of tremor: Secondary | ICD-10-CM | POA: Diagnosis not present

## 2020-12-31 ENCOUNTER — Inpatient Hospital Stay: Payer: Medicare Other | Admitting: Nutrition

## 2020-12-31 DIAGNOSIS — I10 Essential (primary) hypertension: Secondary | ICD-10-CM | POA: Diagnosis not present

## 2020-12-31 DIAGNOSIS — E785 Hyperlipidemia, unspecified: Secondary | ICD-10-CM | POA: Diagnosis not present

## 2020-12-31 DIAGNOSIS — G8194 Hemiplegia, unspecified affecting left nondominant side: Secondary | ICD-10-CM | POA: Diagnosis not present

## 2020-12-31 DIAGNOSIS — C711 Malignant neoplasm of frontal lobe: Secondary | ICD-10-CM | POA: Diagnosis not present

## 2020-12-31 DIAGNOSIS — G252 Other specified forms of tremor: Secondary | ICD-10-CM | POA: Diagnosis not present

## 2020-12-31 DIAGNOSIS — R569 Unspecified convulsions: Secondary | ICD-10-CM | POA: Diagnosis not present

## 2021-01-02 ENCOUNTER — Other Ambulatory Visit: Payer: Self-pay

## 2021-01-02 ENCOUNTER — Telehealth: Payer: Self-pay | Admitting: *Deleted

## 2021-01-02 ENCOUNTER — Emergency Department (HOSPITAL_COMMUNITY): Payer: Medicare Other

## 2021-01-02 ENCOUNTER — Encounter (HOSPITAL_COMMUNITY): Payer: Self-pay | Admitting: Internal Medicine

## 2021-01-02 ENCOUNTER — Observation Stay (HOSPITAL_COMMUNITY): Payer: Medicare Other

## 2021-01-02 ENCOUNTER — Observation Stay (HOSPITAL_COMMUNITY)
Admission: EM | Admit: 2021-01-02 | Discharge: 2021-01-03 | Disposition: A | Discharge status: Home / Self Care | Payer: Medicare Other | Attending: Internal Medicine | Admitting: Internal Medicine

## 2021-01-02 DIAGNOSIS — E785 Hyperlipidemia, unspecified: Secondary | ICD-10-CM | POA: Diagnosis present

## 2021-01-02 DIAGNOSIS — I10 Essential (primary) hypertension: Secondary | ICD-10-CM | POA: Diagnosis not present

## 2021-01-02 DIAGNOSIS — Z79899 Other long term (current) drug therapy: Secondary | ICD-10-CM | POA: Insufficient documentation

## 2021-01-02 DIAGNOSIS — C719 Malignant neoplasm of brain, unspecified: Secondary | ICD-10-CM | POA: Diagnosis not present

## 2021-01-02 DIAGNOSIS — C711 Malignant neoplasm of frontal lobe: Secondary | ICD-10-CM | POA: Diagnosis present

## 2021-01-02 DIAGNOSIS — G40909 Epilepsy, unspecified, not intractable, without status epilepticus: Secondary | ICD-10-CM | POA: Diagnosis not present

## 2021-01-02 DIAGNOSIS — Z20822 Contact with and (suspected) exposure to covid-19: Secondary | ICD-10-CM | POA: Insufficient documentation

## 2021-01-02 DIAGNOSIS — G9389 Other specified disorders of brain: Secondary | ICD-10-CM | POA: Diagnosis not present

## 2021-01-02 DIAGNOSIS — R251 Tremor, unspecified: Secondary | ICD-10-CM | POA: Diagnosis not present

## 2021-01-02 DIAGNOSIS — E039 Hypothyroidism, unspecified: Secondary | ICD-10-CM | POA: Insufficient documentation

## 2021-01-02 DIAGNOSIS — R4182 Altered mental status, unspecified: Secondary | ICD-10-CM | POA: Diagnosis not present

## 2021-01-02 DIAGNOSIS — R531 Weakness: Secondary | ICD-10-CM | POA: Diagnosis not present

## 2021-01-02 DIAGNOSIS — G252 Other specified forms of tremor: Secondary | ICD-10-CM | POA: Diagnosis not present

## 2021-01-02 DIAGNOSIS — R569 Unspecified convulsions: Secondary | ICD-10-CM | POA: Insufficient documentation

## 2021-01-02 DIAGNOSIS — G8194 Hemiplegia, unspecified affecting left nondominant side: Secondary | ICD-10-CM | POA: Diagnosis not present

## 2021-01-02 LAB — CBC WITH DIFFERENTIAL/PLATELET
Abs Immature Granulocytes: 0.09 10*3/uL — ABNORMAL HIGH (ref 0.00–0.07)
Basophils Absolute: 0 10*3/uL (ref 0.0–0.1)
Basophils Relative: 1 %
Eosinophils Absolute: 0 10*3/uL (ref 0.0–0.5)
Eosinophils Relative: 0 %
HCT: 36.4 % (ref 36.0–46.0)
Hemoglobin: 12.4 g/dL (ref 12.0–15.0)
Immature Granulocytes: 1 %
Lymphocytes Relative: 17 %
Lymphs Abs: 1.4 10*3/uL (ref 0.7–4.0)
MCH: 32.6 pg (ref 26.0–34.0)
MCHC: 34.1 g/dL (ref 30.0–36.0)
MCV: 95.8 fL (ref 80.0–100.0)
Monocytes Absolute: 0.5 10*3/uL (ref 0.1–1.0)
Monocytes Relative: 6 %
Neutro Abs: 6.1 10*3/uL (ref 1.7–7.7)
Neutrophils Relative %: 75 %
Platelets: 447 10*3/uL — ABNORMAL HIGH (ref 150–400)
RBC: 3.8 MIL/uL — ABNORMAL LOW (ref 3.87–5.11)
RDW: 11.9 % (ref 11.5–15.5)
WBC: 8.2 10*3/uL (ref 4.0–10.5)
nRBC: 0 % (ref 0.0–0.2)

## 2021-01-02 LAB — URINALYSIS, COMPLETE (UACMP) WITH MICROSCOPIC
Bilirubin Urine: NEGATIVE
Glucose, UA: NEGATIVE mg/dL
Hgb urine dipstick: NEGATIVE
Ketones, ur: NEGATIVE mg/dL
Leukocytes,Ua: NEGATIVE
Nitrite: NEGATIVE
Protein, ur: NEGATIVE mg/dL
Specific Gravity, Urine: 1.005 (ref 1.005–1.030)
pH: 8 (ref 5.0–8.0)

## 2021-01-02 LAB — COMPREHENSIVE METABOLIC PANEL
ALT: 22 U/L (ref 0–44)
AST: 17 U/L (ref 15–41)
Albumin: 3 g/dL — ABNORMAL LOW (ref 3.5–5.0)
Alkaline Phosphatase: 79 U/L (ref 38–126)
Anion gap: 5 (ref 5–15)
BUN: 9 mg/dL (ref 8–23)
CO2: 31 mmol/L (ref 22–32)
Calcium: 8.7 mg/dL — ABNORMAL LOW (ref 8.9–10.3)
Chloride: 104 mmol/L (ref 98–111)
Creatinine, Ser: 0.55 mg/dL (ref 0.44–1.00)
GFR, Estimated: >60 mL/min (ref >60)
Glucose, Bld: 114 mg/dL — ABNORMAL HIGH (ref 70–99)
Potassium: 3.9 mmol/L (ref 3.5–5.1)
Sodium: 140 mmol/L (ref 135–145)
Total Bilirubin: 0.5 mg/dL (ref 0.3–1.2)
Total Protein: 6.6 g/dL (ref 6.5–8.1)

## 2021-01-02 LAB — I-STAT CHEM 8, ED
BUN: 7 mg/dL — ABNORMAL LOW (ref 8–23)
Calcium, Ion: 1.1 mmol/L — ABNORMAL LOW (ref 1.15–1.40)
Chloride: 100 mmol/L (ref 98–111)
Creatinine, Ser: 0.5 mg/dL (ref 0.44–1.00)
Glucose, Bld: 110 mg/dL — ABNORMAL HIGH (ref 70–99)
HCT: 35 % — ABNORMAL LOW (ref 36.0–46.0)
Hemoglobin: 11.9 g/dL — ABNORMAL LOW (ref 12.0–15.0)
Potassium: 3.9 mmol/L (ref 3.5–5.1)
Sodium: 139 mmol/L (ref 135–145)
TCO2: 32 mmol/L (ref 22–32)

## 2021-01-02 LAB — PROTIME-INR
INR: 0.9 (ref 0.8–1.2)
Prothrombin Time: 12.2 seconds (ref 11.4–15.2)

## 2021-01-02 LAB — APTT: aPTT: 27 seconds (ref 24–36)

## 2021-01-02 MED ORDER — DEXAMETHASONE SODIUM PHOSPHATE 4 MG/ML IJ SOLN
4.0000 mg | Freq: Two times a day (BID) | INTRAMUSCULAR | Status: DC
  Administered 2021-01-02: 4 mg via INTRAVENOUS
  Filled 2021-01-02: qty 1

## 2021-01-02 MED ORDER — ACETAMINOPHEN 650 MG RE SUPP: 650.0000 mg | Freq: Four times a day (QID) | RECTAL | Status: DC | PRN

## 2021-01-02 MED ORDER — LEVETIRACETAM IN NACL 1000 MG/100ML IV SOLN
1000.0000 mg | Freq: Two times a day (BID) | INTRAVENOUS | Status: DC
  Administered 2021-01-02 – 2021-01-03 (×2): 1000 mg via INTRAVENOUS
  Filled 2021-01-02 (×2): qty 100

## 2021-01-02 MED ORDER — ONDANSETRON HCL 4 MG/2ML IJ SOLN: 4.0000 mg | Freq: Four times a day (QID) | INTRAMUSCULAR | Status: DC | PRN

## 2021-01-02 MED ORDER — GADOBUTROL 1 MMOL/ML IV SOLN
5.5000 mL | Freq: Once | INTRAVENOUS | Status: AC | PRN
  Administered 2021-01-02: 5.5 mL via INTRAVENOUS

## 2021-01-02 MED ORDER — TEMOZOLOMIDE 100 MG PO CAPS: 200.0000 mg | ORAL_CAPSULE | Freq: Every day | ORAL | Status: DC

## 2021-01-02 MED ORDER — ACETAMINOPHEN 325 MG PO TABS: 650.0000 mg | ORAL_TABLET | Freq: Four times a day (QID) | ORAL | Status: DC | PRN

## 2021-01-02 MED ORDER — TEMOZOLOMIDE 20 MG PO CAPS
20.0000 mg | ORAL_CAPSULE | Freq: Every day | ORAL | Status: DC
Start: 2021-01-21 — End: 2021-01-03

## 2021-01-02 MED ORDER — ONDANSETRON HCL 4 MG PO TABS: 4.0000 mg | ORAL_TABLET | Freq: Four times a day (QID) | ORAL | Status: DC | PRN

## 2021-01-02 NOTE — ED Notes (Signed)
Patient returned from MRI.

## 2021-01-02 NOTE — ED Triage Notes (Signed)
Pt presents to ED via ems cc LUE tremor. Pt currently being treated for a brain tumor. Pt states onset today. Pt denies Headache, cp, sob, dizziness, n/v/d/c, fever. Respirations equal, unlabored. A&ox4.

## 2021-01-02 NOTE — ED Provider Notes (Signed)
[facility] DEPT Provider Note   CSN: [id number] Arrival date & time: 01/02/21  1428     History Chief Complaint  Patient presents with   Tremors    Darlene Marsh is a 70 y.o. female.  HPI Patient presents for recurrence of LUE tremor.  History is as follows: In April of this year she had a new onset focal tremor of her left arm.  She was diagnosed with a glioblastoma.  She underwent hemotherapy, radiation, and resection.  She had resolution of her symptoms.  She was using a walker or assistance for ambulation.  She finished a steroid taper 1 week ago.  She presents today for recurrence of left arm tremor.  Initially, patient unable to provide much history.  She requested that I speak to her daughter.  Patient's husband subsequently arrived in the ED and joined her at bedside.  Patient's husband reports the following: She was doing well up until midday.  They actually went out to eat around lunchtime.  She was able to walk to the car with his assistance.  While on the way home, patient was holding their food.  She experienced symptoms in her left arm.  The symptoms were consistent with her initial presentation that led to the diagnosis of a brain tumor.  Due to this recurrence of symptoms, patient came to the ED.  Patient's husband states that, even before today, she would have episodes where she would momentarily have difficulty comprehending or following instruction.  At times, her left grip becomes fixed on item.  However, her symptoms today of left arm uncontrolled movements and tremors is new.  Patient denies any current pain.    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   Essential hypertension 01/05/2018   Hypothyroidism 08/23/2017   Hyperlipidemia 08/23/2017    Past Surgical History:  Procedure Laterality Date   APPLICATION OF CRANIAL NAVIGATION Right 09/05/2020   Procedure: APPLICATION OF CRANIAL NAVIGATION;  Surgeon: Vallarie Mare, MD;  Location: [facility];  Service: Neurosurgery;  Laterality: Right;   CRANIOTOMY Right 09/05/2020   Procedure: Craniotomy - right Frontal interhemispheric approach for tumor resection with BRAIN LAB;  Surgeon: Vallarie Mare, MD;  Location: [facility];  Service: Neurosurgery;  Laterality: Right;     OB History     Gravida  3   Para  3   Term      Preterm      AB      Living         SAB      IAB      Ectopic      Multiple      Live Births              Family History  Problem Relation Age of Onset   Cancer Mother        Breast cancer   Cancer Sister        Breast   Cancer Sister        Breast    Social History   Tobacco Use   Smoking status: Never  Smokeless tobacco: Never  Vaping Use   Vaping Use: Never used  Substance Use Topics   Alcohol use: Never   Drug use: Never    Home Medications Prior to Admission medications   Medication Sig Start Date End Date Taking? Authorizing Provider  dexamethasone (DECADRON) 1 MG tablet Take 1 tablet (1 mg total) by mouth daily. 12/23/20  Yes Vaslow, Acey Lav, MD  levETIRAcetam (KEPPRA) 500 MG tablet Take 1 tablet (500 mg total) by mouth 2 (two) times daily. 09/20/20  Yes Love, Ivan Anchors, PA-C  ondansetron (ZOFRAN) 8 MG tablet Take 1 tablet (8 mg total) by mouth 2 (two) times daily as needed (nausea and vomiting). May take 30-60 minutes prior to Temodar administration if nausea/vomiting occurs. Patient taking differently: Take 8 mg by mouth 2 (two) times daily as needed (nausea and vomiting and may take 30-60 minutes prior to Temodar administration if nausea/vomiting occurs). 12/23/20  Yes Vaslow, Acey Lav, MD   temozolomide (TEMODAR) 100 MG capsule Take 2 capsules (200 mg total) by mouth daily. May take on an empty stomach to decrease nausea & vomiting. Patient taking differently: Take 200 mg by mouth See admin instructions. Take 200 mg by mouth once a day for 5 days a month/off for 23 days- may take on an empty stomach to decrease nausea & vomiting 12/23/20  Yes Vaslow, Acey Lav, MD  temozolomide (TEMODAR) 20 MG capsule Take 1 capsule (20 mg total) by mouth daily. May take on an empty stomach to decrease nausea & vomiting. Patient taking differently: Take 20 mg by mouth See admin instructions. Take 20 mg by mouth once a day for 5 days a month/off for 23 days- may take on an empty stomach to decrease nausea & vomiting 12/23/20  Yes Vaslow, Acey Lav, MD    Allergies    Patient has no known allergies.  Review of Systems   Review of Systems  Constitutional:  Negative for activity change, appetite change, chills and fever.  HENT:  Negative for congestion, facial swelling and voice change.   Eyes:  Negative for visual disturbance.  Respiratory:  Negative for cough, chest tightness and shortness of breath.   Cardiovascular:  Negative for chest pain.  Gastrointestinal:  Negative for abdominal pain, nausea and vomiting.  Genitourinary:  Negative for flank pain.  Musculoskeletal:  Positive for gait problem. Negative for arthralgias, back pain, joint swelling, myalgias and neck pain.  Skin:  Negative for color change and rash.  Neurological:  Positive for tremors and weakness. Negative for syncope.  Hematological:  Does not bruise/bleed easily.  Psychiatric/Behavioral:  Positive for confusion.   All other systems reviewed and are negative.  Physical Exam Updated Vital Signs BP (!) 138/95 (BP Location: Right Arm)   Pulse 76   Temp 98.4 F (36.9 C) (Oral)   Resp 16   Ht '5\' 6"'$  (1.676 m)   Wt 54.4 kg   SpO2 98%   BMI 19.37 kg/m   Physical Exam Vitals and nursing note reviewed.  Constitutional:       General: She is not in acute distress.    Appearance: She is well-developed and underweight.  HENT:     Head: Normocephalic and atraumatic.     Right Ear: External ear normal.     Left Ear: External ear normal.     Nose: Nose normal.  Eyes:     Conjunctiva/sclera: Conjunctivae normal.     Comments: Initially, gaze fixed to the left.  Subsequently had normal EOM.  Neck:  Comments: Initially, neck rotated to the left, subsequently had normal range of motion. Cardiovascular:     Rate and Rhythm: Normal rate and regular rhythm.     Heart sounds: No murmur heard. Pulmonary:     Effort: Pulmonary effort is normal. No respiratory distress.     Breath sounds: Normal breath sounds.  Abdominal:     Palpations: Abdomen is soft.     Tenderness: There is no abdominal tenderness. There is no guarding.  Musculoskeletal:        General: No swelling or signs of injury.     Cervical back: Neck supple.  Skin:    General: Skin is warm and dry.     Coloration: Skin is not jaundiced or pale.  Neurological:     Mental Status: She is alert.     Comments: On initial presentation, patient had eyes and neck deviated to the left.  This appeared to be involuntary.  She also had her left arm raised into the air and flexed at the elbow.  Left arm was rigid.  Left arm positioning also appeared to be involuntary.  She was also unable to elevation of her left leg and left leg was straight and rigid.  Strength intact in right hemibody.  During this time, she was able to engage in conversation, however, did have moments where she was slow to respond.  Psychiatric:        Mood and Affect: Mood normal.    ED Results / Procedures / Treatments   Labs (all labs ordered are listed, but only abnormal results are displayed) Labs Reviewed  COMPREHENSIVE METABOLIC PANEL - Abnormal; Notable for the following components:      Result Value   Glucose, Bld 114 (*)    Calcium 8.7 (*)    Albumin 3.0 (*)    All other  components within normal limits  CBC WITH DIFFERENTIAL/PLATELET - Abnormal; Notable for the following components:   RBC 3.80 (*)    Platelets 447 (*)    Abs Immature Granulocytes 0.09 (*)    All other components within normal limits  URINALYSIS, COMPLETE (UACMP) WITH MICROSCOPIC - Abnormal; Notable for the following components:   Color, Urine STRAW (*)    Bacteria, UA RARE (*)    All other components within normal limits  I-STAT CHEM 8, ED - Abnormal; Notable for the following components:   BUN 7 (*)    Glucose, Bld 110 (*)    Calcium, Ion 1.10 (*)    Hemoglobin 11.9 (*)    HCT 35.0 (*)    All other components within normal limits  SARS CORONAVIRUS 2 (TAT 6-24 HRS)  PROTIME-INR  APTT    EKG None  Radiology CT HEAD WO CONTRAST  Result Date: 01/02/2021 CLINICAL DATA:  Mental status change. Unknown cause. Left upper extremity tremor EXAM: CT HEAD WITHOUT CONTRAST TECHNIQUE: Contiguous axial images were obtained from the base of the skull through the vertex without intravenous contrast. COMPARISON:  CT head 11/27/2020, MR head 12/20/2020 FINDINGS: Brain: Redemonstration of right frontal lobe vasogenic edema consistent with known underlying mass that is better appreciated on MR head 12/20/2020. No evidence of large-territorial acute infarction. No parenchymal hemorrhage. No mass lesion. No extra-axial collection. No mass effect or midline shift. No hydrocephalus. Basilar cisterns are patent. Vascular: No hyperdense vessel. Skull: No acute fracture or focal lesion. Right frontal craniectomy. Sinuses/Orbits: Paranasal sinuses and mastoid air cells are clear. The orbits are unremarkable. Other: None. IMPRESSION: No acute intracranial abnormality in  a patient with known right frontal lobe lesion that is incompletely evaluated on this study but better visualized on MR head 12/20/2020. Electronically Signed   By: Iven Finn M.D.   On: 01/02/2021 15:55    Procedures Procedures   Medications  Ordered in ED Medications  dexamethasone (DECADRON) injection 4 mg (4 mg Intravenous Given 01/02/21 1650)  levETIRAcetam (KEPPRA) IVPB 1000 mg/100 mL premix (0 mg Intravenous Stopped 01/02/21 1731)    ED Course  I have reviewed the triage vital signs and the nursing notes.  Pertinent labs & imaging results that were available during my care of the patient were reviewed by me and considered in my medical decision making (see chart for details).    MDM Rules/Calculators/A&P                           Patient is a 70 year old female with history of glioblastoma, s/p chemotherapy, radiation, and resection, presents for recurrence of left-sided neurologic symptoms.  Per her husband, onset of left arm tremoring was today.  Upon arrival in the ED, vital signs are normal.  On initial examination, patient was resting on stretcher, awake, with left arm elevated, flexed, and rigid.  She also had neck and eyes deviated to the left.  Left leg was rigid and straight.  She was able to engage in conversation, however, at times was slow to respond.  She requested that I speak to her daughter to get history.  No focal deficits appreciated on the right hemibody.  Presentation consistent with possible partial seizure.  Noncontrasted CT scan was ordered.  Laboratory work-up was initiated.  Patient was subsequently accompanied in the ED by her husband, who provided more history.  At the time of conversation with her husband, patient's left hemibody symptoms had improved.  She now had normal range of motion with her EOM and neck.  She was able to rest her left arm.  She was able to ambulate with assistance, which is her baseline.  Given resolution of her initial symptoms, further suspicion of partial seizure.  Noncon CT was unrevealing.  MRI was ordered.  I spoke with her neurology oncologist, who recommended 4 mg twice daily Decadron and increasing Keppra to 1 g twice daily.  These were ordered.  I spoke with the patient and  her husband in a shared decision-making conversation.  Patient and husband felt more comfortable if the patient was admitted.  Patient was admitted to hospitalist service for further management.  Final Clinical Impression(s) / ED Diagnoses Final diagnoses:  Glioblastoma multiforme [facility])    Rx / DC Orders ED Discharge Orders     None        Godfrey Pick, MD 01/02/21 1902

## 2021-01-02 NOTE — ED Notes (Signed)
ED TO INPATIENT HANDOFF REPORT  ED Nurse Name and Phone #: Erick Colace, RN [phone]  S Name/Age/Gender Darlene Marsh 70 y.o. female Room/Bed: WA20/WA20  Code Status   Code Status: Prior  Home/SNF/Other Home Patient oriented to: self, place, time and situation Is this baseline? Yes   Triage Complete: Triage complete  Chief Complaint Seizure disorder [facility]) [id number]  Triage Note Pt presents to ED via ems cc LUE tremor. Pt currently being treated for a brain tumor. Pt states onset today. Pt denies Headache, cp, sob, dizziness, n/v/d/c, fever. Respirations equal, unlabored. A&ox4.    Allergies No Known Allergies  Level of Care/Admitting Diagnosis ED Disposition    ED Disposition  Admit   Condition  --   Comment  Hospital Area: [facility] [id number]  Level of Care: Telemetry [5]  Admit to tele based on following criteria: Eval of Syncope  May place patient in observation at [facility] or [facility] if equivalent level of care is available:: Yes  Covid Evaluation: Asymptomatic Screening Protocol (No Symptoms)  Diagnosis: Seizure disorder [facility]) [id number]  Admitting Physician: Barb Merino [id number]  Attending Physician: Barb Merino [id number]         B Medical/Surgery History Past Medical History:  Diagnosis Date  . Hyperlipidemia   . Hypertension   . Thyroid disease    underactive  . Tremors of nervous system    Past Surgical History:  Procedure Laterality Date  . APPLICATION OF CRANIAL NAVIGATION Right 09/05/2020   Procedure: APPLICATION OF CRANIAL NAVIGATION;  Surgeon: Vallarie Mare, MD;  Location: [facility];  Service: Neurosurgery;  Laterality: Right;  . CRANIOTOMY Right 09/05/2020   Procedure: Craniotomy - right Frontal interhemispheric approach for tumor resection with BRAIN LAB;  Surgeon: Vallarie Mare, MD;  Location: [facility];  Service: Neurosurgery;  Laterality: Right;     A IV Location/Drains/Wounds Patient  Lines/Drains/Airways Status    Active Line/Drains/Airways    Name Placement date Placement time Site Days   Peripheral IV 01/02/21 20 G Anterior;Right Forearm 01/02/21  1551  Forearm  less than 1   Incision (Closed) 09/05/20 Head 09/05/20  1555  -- 119          Intake/Output Last 24 hours No intake or output data in the 24 hours ending 01/02/21 2039  Labs/Imaging Results for orders placed or performed during the hospital encounter of 01/02/21 (from the past 48 hour(s))  Comprehensive metabolic panel     Status: Abnormal   Collection Time: 01/02/21  3:10 PM  Result Value Ref Range   Sodium 140 135 - 145 mmol/L   Potassium 3.9 3.5 - 5.1 mmol/L   Chloride 104 98 - 111 mmol/L   CO2 31 22 - 32 mmol/L   Glucose, Bld 114 (H) 70 - 99 mg/dL    Comment: Glucose reference range applies only to samples taken after fasting for at least 8 hours.   BUN 9 8 - 23 mg/dL   Creatinine, Ser 0.55 0.44 - 1.00 mg/dL   Calcium 8.7 (L) 8.9 - 10.3 mg/dL   Total Protein 6.6 6.5 - 8.1 g/dL   Albumin 3.0 (L) 3.5 - 5.0 g/dL   AST 17 15 - 41 U/L   ALT 22 0 - 44 U/L   Alkaline Phosphatase 79 38 - 126 U/L   Total Bilirubin 0.5 0.3 - 1.2 mg/dL   GFR, Estimated >60 >60 mL/min    Comment: (NOTE) Calculated using the CKD-EPI Creatinine Equation (2021)    Anion gap  5 5 - 15    Comment: Performed at [facility], [place] [address] [place], [place] [address]  CBC WITH DIFFERENTIAL     Status: Abnormal   Collection Time: 01/02/21  3:10 PM  Result Value Ref Range   WBC 8.2 4.0 - 10.5 K/uL   RBC 3.80 (L) 3.87 - 5.11 MIL/uL   Hemoglobin 12.4 12.0 - 15.0 g/dL   HCT 36.4 36.0 - 46.0 %   MCV 95.8 80.0 - 100.0 fL   MCH 32.6 26.0 - 34.0 pg   MCHC 34.1 30.0 - 36.0 g/dL   RDW 11.9 11.5 - 15.5 %   Platelets 447 (H) 150 - 400 K/uL   nRBC 0.0 0.0 - 0.2 %   Neutrophils Relative % 75 %   Neutro Abs 6.1 1.7 - 7.7 K/uL   Lymphocytes Relative 17 %   Lymphs Abs 1.4 0.7 - 4.0 K/uL   Monocytes Relative 6 %    Monocytes Absolute 0.5 0.1 - 1.0 K/uL   Eosinophils Relative 0 %   Eosinophils Absolute 0.0 0.0 - 0.5 K/uL   Basophils Relative 1 %   Basophils Absolute 0.0 0.0 - 0.1 K/uL   Immature Granulocytes 1 %   Abs Immature Granulocytes 0.09 (H) 0.00 - 0.07 K/uL    Comment: Performed at [facility], [place] [address], [place], [place] [address]  Urinalysis, Complete w Microscopic Urine, Clean Catch     Status: Abnormal   Collection Time: 01/02/21  3:10 PM  Result Value Ref Range   Color, Urine STRAW (A) YELLOW   APPearance CLEAR CLEAR   Specific Gravity, Urine 1.005 1.005 - 1.030   pH 8.0 5.0 - 8.0   Glucose, UA NEGATIVE NEGATIVE mg/dL   Hgb urine dipstick NEGATIVE NEGATIVE   Bilirubin Urine NEGATIVE NEGATIVE   Ketones, ur NEGATIVE NEGATIVE mg/dL   Protein, ur NEGATIVE NEGATIVE mg/dL   Nitrite NEGATIVE NEGATIVE   Leukocytes,Ua NEGATIVE NEGATIVE   RBC / HPF 0-5 0 - 5 RBC/hpf   WBC, UA 0-5 0 - 5 WBC/hpf   Bacteria, UA RARE (A) NONE SEEN    Comment: Performed at [facility], [place] [address], [place], [place] [address]  Protime-INR     Status: None   Collection Time: 01/02/21  3:10 PM  Result Value Ref Range   Prothrombin Time 12.2 11.4 - 15.2 seconds   INR 0.9 0.8 - 1.2    Comment: (NOTE) INR goal varies based on device and disease states. Performed at [facility], [place] [address], [place], [place] [address]   APTT     Status: None   Collection Time: 01/02/21  3:10 PM  Result Value Ref Range   aPTT 27 24 - 36 seconds    Comment: Performed at [facility], [place] [address], [place], [place] [address]  I-Stat Chem 8, ED     Status: Abnormal   Collection Time: 01/02/21  3:42 PM  Result Value Ref Range   Sodium 139 135 - 145 mmol/L   Potassium 3.9 3.5 - 5.1 mmol/L   Chloride 100 98 - 111 mmol/L   BUN 7 (L) 8 - 23 mg/dL   Creatinine, Ser 0.50 0.44 - 1.00 mg/dL   Glucose, Bld 110 (H) 70 - 99 mg/dL    Comment:  Glucose reference range applies only to samples taken after fasting for at least 8 hours.   Calcium, Ion 1.10 (L) 1.15 - 1.40 mmol/L   TCO2  32 22 - 32 mmol/L   Hemoglobin 11.9 (L) 12.0 - 15.0 g/dL   HCT 35.0 (L) 36.0 - 46.0 %   CT HEAD WO CONTRAST  Result Date: 01/02/2021 CLINICAL DATA:  Mental status change. Unknown cause. Left upper extremity tremor EXAM: CT HEAD WITHOUT CONTRAST TECHNIQUE: Contiguous axial images were obtained from the base of the skull through the vertex without intravenous contrast. COMPARISON:  CT head 11/27/2020, MR head 12/20/2020 FINDINGS: Brain: Redemonstration of right frontal lobe vasogenic edema consistent with known underlying mass that is better appreciated on MR head 12/20/2020. No evidence of large-territorial acute infarction. No parenchymal hemorrhage. No mass lesion. No extra-axial collection. No mass effect or midline shift. No hydrocephalus. Basilar cisterns are patent. Vascular: No hyperdense vessel. Skull: No acute fracture or focal lesion. Right frontal craniectomy. Sinuses/Orbits: Paranasal sinuses and mastoid air cells are clear. The orbits are unremarkable. Other: None. IMPRESSION: No acute intracranial abnormality in a patient with known right frontal lobe lesion that is incompletely evaluated on this study but better visualized on MR head 12/20/2020. Electronically Signed   By: Iven Finn M.D.   On: 01/02/2021 15:55    Pending Labs Unresulted Labs (From admission, onward)    Start     Ordered   01/02/21 1724  SARS CORONAVIRUS 2 (TAT 6-24 HRS) Nasopharyngeal Nasopharyngeal Swab  (Tier 3 - Symptomatic/asymptomatic)  Once,   STAT       Question Answer Comment  Is this test for diagnosis or screening Screening   Symptomatic for COVID-19 as defined by CDC No   Hospitalized for COVID-19 No   Admitted to ICU for COVID-19 No   Previously tested for COVID-19 Yes   Resident in a congregate (group) care setting No   Employed in healthcare setting No    Pregnant No   Has patient completed COVID vaccination(s) (2 doses of Pfizer/Moderna 1 dose of [employer]) Unknown      01/02/21 1724          Vitals/Pain Today's Vitals   01/02/21 1630 01/02/21 1645 01/02/21 1900 01/02/21 1930  BP: (!) 138/95 (!) 138/95 114/79 118/87  Pulse: 83 76 77 79  Resp:  '16 17 20  '$ Temp:      TempSrc:      SpO2: 98% 98% 97% 95%  Weight:      Height:      PainSc:        Isolation Precautions No active isolations  Medications Medications  dexamethasone (DECADRON) injection 4 mg (4 mg Intravenous Given 01/02/21 1650)  levETIRAcetam (KEPPRA) IVPB 1000 mg/100 mL premix (0 mg Intravenous Stopped 01/02/21 1731)  gadobutrol (GADAVIST) 1 MMOL/ML injection 5.5 mL (5.5 mLs Intravenous Contrast Given 01/02/21 2014)    Mobility walks with person assist Low fall risk   Focused Assessments   R Recommendations: See Admitting Provider Note  Report given to:   Additional Notes:

## 2021-01-02 NOTE — Telephone Encounter (Signed)
Patients daughter called to report that patient was having shaking of her arms and legs.  She noticed that it was on the left side when she saw it but her father reported that it was visible on both sides.  It has lasted for over 1 hour off and on.  Towards the end of the 1 hour patient started to complain of having difficulty swallowing and they called 911 for transport to [facility].  Patient started Decadron taper 12/23/2020 Decadron 2 mg to 1 hour and only takes Keppra 500 mg BID.    Notified MD.

## 2021-01-02 NOTE — H&P (Signed)
History and Physical    Darlene Marsh [id number] DOB: [birth year] DOA: 01/02/2021  PCP: Pcp, No  Patient coming from: Home  I have personally briefly reviewed patient's old medical records available.   Chief Complaint: Left-sided arm weakness and jerking movements.  HPI: Darlene Marsh is a 71 y.o. 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  According to the patient, she woke up today morning with tremors of left upper extremity.  She had similar symptom when she was diagnosed with right-sided frontal lobe tumor in April of this year.  Currently on maintenance chemotherapy as well on Keppra.  Patient continued to have shaking of left arm, lasted about 1 hour.  Patient was also noted to be complaining of having difficulty swallowing so family called 911.  Patient tells me that about 1 hour she had weakness on the left side, she did not lose any consciousness.  No postictal confusion.  Denies any other exacerbating factors including fever, nausea, vomiting, headache, visual disturbances, dizziness or lightheadedness.  Denies weakness of any other extremities including left lower extremity.  All of her symptoms are improved by the time she comes to the ER. ED Course: Hemodynamically neurologically stable.  No acute findings.  Electrolytes are normal.  Case discussed with neurooncology, recommended to increase dose of steroids and increased dose of seizure medications and monitor in the hospital. ER physician also ordered MRI of the brain, last MRI on 12/20/20 with slight increase in size of right frontal lobe glioblastoma.  Review of Systems: all systems are  reviewed and pertinent positive as per HPI otherwise rest are negative.    Past Medical History:  Diagnosis Date   Hyperlipidemia    Hypertension    Thyroid disease    underactive   Tremors of nervous system     Past Surgical History:  Procedure Laterality Date   APPLICATION OF CRANIAL NAVIGATION Right 09/05/2020   Procedure: APPLICATION OF CRANIAL NAVIGATION;  Surgeon: Vallarie Mare, MD;  Location: [place];  Service: Neurosurgery;  Laterality: Right;   CRANIOTOMY Right 09/05/2020   Procedure: Craniotomy - right Frontal interhemispheric approach for tumor resection with BRAIN LAB;  Surgeon: Vallarie Mare, MD;  Location: [place];  Service: Neurosurgery;  Laterality: Right;    Social history   reports that she has never smoked. She has never used smokeless tobacco. She reports that she does not drink alcohol and does not use drugs.  No Known Allergies  Family History  Problem Relation Age of Onset   Cancer Mother        Breast cancer   Cancer Sister        Breast   Cancer Sister        Breast     Prior to Admission medications   Medication Sig Start Date End Date Taking? Authorizing Provider  dexamethasone (DECADRON) 1 MG tablet Take 1 tablet (1 mg total) by mouth daily. 12/23/20   Ventura Sellers, MD  levETIRAcetam (KEPPRA) 500 MG tablet Take 1 tablet (500 mg total) by mouth 2 (two) times daily. 09/20/20   Love, Ivan Anchors, PA-C  ondansetron (ZOFRAN) 8 MG tablet Take 1 tablet (8 mg total) by mouth 2 (two) times  daily as needed (nausea and vomiting). May take 30-60 minutes prior to Temodar administration if nausea/vomiting occurs. 12/23/20   Ventura Sellers, MD  temozolomide (TEMODAR) 100 MG capsule Take 2 capsules (200 mg total) by mouth daily. May take on an empty stomach to decrease nausea & vomiting. 12/23/20   Ventura Sellers, MD  temozolomide (TEMODAR) 20 MG capsule Take 1 capsule (20 mg total) by mouth daily. May take on an empty stomach to decrease nausea & vomiting.  12/23/20   Ventura Sellers, MD    Physical Exam: Vitals:   01/02/21 1442 01/02/21 1443 01/02/21 1630 01/02/21 1645  BP: (!) 135/98  (!) 138/95 (!) 138/95  Pulse: 88  83 76  Resp: 16   16  Temp: 98.4 F (36.9 C)     TempSrc: Oral     SpO2: 98%  98% 98%  Weight:  54.4 kg    Height:  '5\' 6"'$  (1.676 m)      Constitutional: NAD, calm, comfortable Vitals:   01/02/21 1442 01/02/21 1443 01/02/21 1630 01/02/21 1645  BP: (!) 135/98  (!) 138/95 (!) 138/95  Pulse: 88  83 76  Resp: 16   16  Temp: 98.4 F (36.9 C)     TempSrc: Oral     SpO2: 98%  98% 98%  Weight:  54.4 kg    Height:  '5\' 6"'$  (1.676 m)     Eyes: PERRL, lids and conjunctivae normal ENMT: Mucous membranes are moist. Posterior pharynx clear of any exudate or lesions.Normal dentition.  Neck: normal, supple, no masses, no thyromegaly Respiratory: clear to auscultation bilaterally, no wheezing, no crackles. Normal respiratory effort. No accessory muscle use.  Cardiovascular: Regular rate and rhythm, no murmurs / rubs / gallops. No extremity edema. 2+ pedal pulses. No carotid bruits.  Abdomen: no tenderness, no masses palpated. No hepatosplenomegaly. Bowel sounds positive.  Musculoskeletal: no clubbing / cyanosis. No joint deformity upper and lower extremities. Good ROM, no contractures. Normal muscle tone.  Skin: no rashes, lesions, ulcers. No induration Neurologic: CN 2-12 grossly intact. Sensation intact, DTR normal. Strength 5/5 in all 4.  Psychiatric: Normal judgment and insight. Alert and oriented x 3. Normal mood.     Labs on Admission: I have personally reviewed following labs and imaging studies  CBC: Recent Labs  Lab 01/02/21 1510 01/02/21 1542  WBC 8.2  --   NEUTROABS 6.1  --   HGB 12.4 11.9*  HCT 36.4 35.0*  MCV 95.8  --   PLT 447*  --    Basic Metabolic Panel: Recent Labs  Lab 01/02/21 1510 01/02/21 1542  NA 140 139  K 3.9 3.9  CL 104 100  CO2 31  --   GLUCOSE 114* 110*  BUN 9 7*  CREATININE  0.55 0.50  CALCIUM 8.7*  --    GFR: Estimated Creatinine Clearance: 54.6 mL/min (by C-G formula based on SCr of 0.5 mg/dL). Liver Function Tests: Recent Labs  Lab 01/02/21 1510  AST 17  ALT 22  ALKPHOS 79  BILITOT 0.5  PROT 6.6  ALBUMIN 3.0*   No results for input(s): LIPASE, AMYLASE in the last 168 hours. No results for input(s): AMMONIA in the last 168 hours. Coagulation Profile: Recent Labs  Lab 01/02/21 1510  INR 0.9   Cardiac Enzymes: No results for input(s): CKTOTAL, CKMB, CKMBINDEX, TROPONINI in the last 168 hours. BNP (last 3 results) No results for input(s): PROBNP in the last 8760 hours. HbA1C: No results for input(s): HGBA1C in the  last 72 hours. CBG: No results for input(s): GLUCAP in the last 168 hours. Lipid Profile: No results for input(s): CHOL, HDL, LDLCALC, TRIG, CHOLHDL, LDLDIRECT in the last 72 hours. Thyroid Function Tests: No results for input(s): TSH, T4TOTAL, FREET4, T3FREE, THYROIDAB in the last 72 hours. Anemia Panel: No results for input(s): VITAMINB12, FOLATE, FERRITIN, TIBC, IRON, RETICCTPCT in the last 72 hours. Urine analysis:    Component Value Date/Time   COLORURINE STRAW (A) 01/02/2021 1510   APPEARANCEUR CLEAR 01/02/2021 1510   LABSPEC 1.005 01/02/2021 1510   PHURINE 8.0 01/02/2021 1510   GLUCOSEU NEGATIVE 01/02/2021 1510   HGBUR NEGATIVE 01/02/2021 1510   BILIRUBINUR NEGATIVE 01/02/2021 1510   KETONESUR NEGATIVE 01/02/2021 1510   PROTEINUR NEGATIVE 01/02/2021 1510   NITRITE NEGATIVE 01/02/2021 1510   LEUKOCYTESUR NEGATIVE 01/02/2021 1510    Radiological Exams on Admission: CT HEAD WO CONTRAST  Result Date: 01/02/2021 CLINICAL DATA:  Mental status change. Unknown cause. Left upper extremity tremor EXAM: CT HEAD WITHOUT CONTRAST TECHNIQUE: Contiguous axial images were obtained from the base of the skull through the vertex without intravenous contrast. COMPARISON:  CT head 11/27/2020, MR head 12/20/2020 FINDINGS: Brain:  Redemonstration of right frontal lobe vasogenic edema consistent with known underlying mass that is better appreciated on MR head 12/20/2020. No evidence of large-territorial acute infarction. No parenchymal hemorrhage. No mass lesion. No extra-axial collection. No mass effect or midline shift. No hydrocephalus. Basilar cisterns are patent. Vascular: No hyperdense vessel. Skull: No acute fracture or focal lesion. Right frontal craniectomy. Sinuses/Orbits: Paranasal sinuses and mastoid air cells are clear. The orbits are unremarkable. Other: None. IMPRESSION: No acute intracranial abnormality in a patient with known right frontal lobe lesion that is incompletely evaluated on this study but better visualized on MR head 12/20/2020. Electronically Signed   By: Iven Finn M.D.   On: 01/02/2021 15:55    EKG: Independently reviewed.  Not done today.  No indication.  Assessment/Plan Principal Problem:   Seizure disorder [facility]) Active Problems:   Hypothyroidism   Hyperlipidemia   Essential hypertension   Glioblastoma multiforme of frontal lobe ([place])     1.  Seizure disorder in a patient with left sided focal seizure with known right frontal lobe intracranial tumor: Agree with observation in the hospital.  Seizure precautions. Started on increased dose of dexamethasone, 4 mg twice a day, will give first dose IV and subsequently changed to oral. Increased dose of Keppra for better seizure control with known seizure disorder. If stable, will change to increased dose of seizure medications and steroids and discharged home with plan to follow-up with oncology next week.  2.  Right frontal lobe brain tumor: Status postsurgery and chemoradiation.  Currently on maintenance chemotherapy with temozolomide 220 mg daily, will continue.  3.  Hypothyroidism: On Synthroid continue.  4.  Essential hypertension: Blood pressure is stable.   DVT prophylaxis: SCDs Code Status: Full code Family Communication:  Husband at bedside Disposition Plan: Home.  Anticipate tomorrow Consults called: Neuro oncology, Dr. Mickeal Skinner, called and discussed case.  He recommended if no more seizures to discharge patient home tomorrow on Keppra 1000 mg twice daily and dexamethasone 4 mg twice daily to follow-up with him next week. Admission status: Observation.  MedSurg bed.   Barb Merino MD [facility] Pager [phone]

## 2021-01-03 DIAGNOSIS — G40909 Epilepsy, unspecified, not intractable, without status epilepticus: Secondary | ICD-10-CM | POA: Diagnosis not present

## 2021-01-03 LAB — SARS CORONAVIRUS 2 (TAT 6-24 HRS): SARS Coronavirus 2: NEGATIVE

## 2021-01-03 MED ORDER — LEVETIRACETAM 1000 MG PO TABS: 1000.0000 mg | ORAL_TABLET | Freq: Two times a day (BID) | ORAL | 0 refills | Status: DC

## 2021-01-03 MED ORDER — DEXAMETHASONE 4 MG PO TABS: 4.0000 mg | ORAL_TABLET | Freq: Two times a day (BID) | ORAL | 0 refills | Status: AC

## 2021-01-03 MED ORDER — DEXAMETHASONE 4 MG PO TABS
4.0000 mg | ORAL_TABLET | Freq: Two times a day (BID) | ORAL | Status: DC
  Administered 2021-01-03: 4 mg via ORAL
  Filled 2021-01-03: qty 1

## 2021-01-03 MED ORDER — LEVETIRACETAM 500 MG PO TABS
1000.0000 mg | ORAL_TABLET | Freq: Two times a day (BID) | ORAL | Status: DC
  Administered 2021-01-03: 1000 mg via ORAL
  Filled 2021-01-03: qty 4
  Filled 2021-01-03: qty 2

## 2021-01-03 NOTE — Progress Notes (Signed)
Patient is stable, no change from am assessment. Pt is alert, oriented(x4) ambulatory. Discharge instructions were reviewed with the pt/daughter (christine Torosian). Questions, concerns were denied at this time.

## 2021-01-03 NOTE — Discharge Summary (Signed)
70 year old with a right frontal lobe glioblastoma status postresection and chemoradiation, recently tapered off his steroids brought to the ER with persistent left upper extremity tremor with focal seizure.  No generalization.  Observed overnight.  Plan: Discharge home with increased dose of Keppra 1000 mg twice a day Increased dose of dexamethasone 4 mg twice a day. Will be seen by her neuro-oncology Dr. Mickeal Skinner next week to decide further course of action. No events overnight.  Patient seen and examined.  Her daughter was at the bedside.  Patient is up about and eager to go home.  No overnight events.  General: Looks very comfortable. Cardiovascular: S1-S2 normal. Respiratory: Bilateral clear.  No added sounds Gastrointestinal: Soft and nontender. Ext: No swelling or edema. Neuro: AAOx4.  No focal deficits.   Time spent coordinating discharge: 20 minutes

## 2021-01-06 DIAGNOSIS — G252 Other specified forms of tremor: Secondary | ICD-10-CM | POA: Diagnosis not present

## 2021-01-06 DIAGNOSIS — C711 Malignant neoplasm of frontal lobe: Secondary | ICD-10-CM | POA: Diagnosis not present

## 2021-01-06 DIAGNOSIS — Z9181 History of falling: Secondary | ICD-10-CM | POA: Diagnosis not present

## 2021-01-06 DIAGNOSIS — R569 Unspecified convulsions: Secondary | ICD-10-CM | POA: Diagnosis not present

## 2021-01-06 DIAGNOSIS — E785 Hyperlipidemia, unspecified: Secondary | ICD-10-CM | POA: Diagnosis not present

## 2021-01-06 DIAGNOSIS — G8194 Hemiplegia, unspecified affecting left nondominant side: Secondary | ICD-10-CM | POA: Diagnosis not present

## 2021-01-06 DIAGNOSIS — E039 Hypothyroidism, unspecified: Secondary | ICD-10-CM | POA: Diagnosis not present

## 2021-01-06 DIAGNOSIS — I10 Essential (primary) hypertension: Secondary | ICD-10-CM | POA: Diagnosis not present

## 2021-01-07 ENCOUNTER — Telehealth: Payer: Self-pay | Admitting: Internal Medicine

## 2021-01-07 NOTE — Telephone Encounter (Signed)
Called patient regarding upcoming appointments, patient is notified. 

## 2021-01-09 DIAGNOSIS — E785 Hyperlipidemia, unspecified: Secondary | ICD-10-CM | POA: Diagnosis not present

## 2021-01-09 DIAGNOSIS — R569 Unspecified convulsions: Secondary | ICD-10-CM | POA: Diagnosis not present

## 2021-01-09 DIAGNOSIS — C711 Malignant neoplasm of frontal lobe: Secondary | ICD-10-CM | POA: Diagnosis not present

## 2021-01-09 DIAGNOSIS — I10 Essential (primary) hypertension: Secondary | ICD-10-CM | POA: Diagnosis not present

## 2021-01-09 DIAGNOSIS — G8194 Hemiplegia, unspecified affecting left nondominant side: Secondary | ICD-10-CM | POA: Diagnosis not present

## 2021-01-09 DIAGNOSIS — G252 Other specified forms of tremor: Secondary | ICD-10-CM | POA: Diagnosis not present

## 2021-01-14 ENCOUNTER — Inpatient Hospital Stay: Payer: Medicare Other | Attending: Internal Medicine | Admitting: Nutrition

## 2021-01-14 ENCOUNTER — Telehealth: Payer: Self-pay

## 2021-01-14 ENCOUNTER — Encounter: Payer: Self-pay | Admitting: Nutrition

## 2021-01-14 DIAGNOSIS — C711 Malignant neoplasm of frontal lobe: Secondary | ICD-10-CM | POA: Insufficient documentation

## 2021-01-14 NOTE — Telephone Encounter (Signed)
Pts daughter LM stating pt was given rx of Temodar and does not have any to restart on 8/21.   She wants to know if the pt should be sent a refill before or wait to restart after her follow-up appt on 01/27/21?

## 2021-01-14 NOTE — Progress Notes (Signed)
Patient did not show for nutrition appointment. 

## 2021-01-15 ENCOUNTER — Other Ambulatory Visit (HOSPITAL_COMMUNITY): Payer: Self-pay

## 2021-01-16 DIAGNOSIS — R569 Unspecified convulsions: Secondary | ICD-10-CM | POA: Diagnosis not present

## 2021-01-16 DIAGNOSIS — E785 Hyperlipidemia, unspecified: Secondary | ICD-10-CM | POA: Diagnosis not present

## 2021-01-16 DIAGNOSIS — G8194 Hemiplegia, unspecified affecting left nondominant side: Secondary | ICD-10-CM | POA: Diagnosis not present

## 2021-01-16 DIAGNOSIS — G252 Other specified forms of tremor: Secondary | ICD-10-CM | POA: Diagnosis not present

## 2021-01-16 DIAGNOSIS — I10 Essential (primary) hypertension: Secondary | ICD-10-CM | POA: Diagnosis not present

## 2021-01-16 DIAGNOSIS — C711 Malignant neoplasm of frontal lobe: Secondary | ICD-10-CM | POA: Diagnosis not present

## 2021-01-17 ENCOUNTER — Other Ambulatory Visit (HOSPITAL_COMMUNITY): Payer: Self-pay

## 2021-01-17 ENCOUNTER — Other Ambulatory Visit: Payer: Self-pay | Admitting: Internal Medicine

## 2021-01-17 DIAGNOSIS — C711 Malignant neoplasm of frontal lobe: Secondary | ICD-10-CM

## 2021-01-20 ENCOUNTER — Encounter: Payer: Self-pay | Admitting: Internal Medicine

## 2021-01-20 ENCOUNTER — Other Ambulatory Visit (HOSPITAL_COMMUNITY): Payer: Self-pay

## 2021-01-20 NOTE — Telephone Encounter (Signed)
Patient needs to see MD and have labs prior to having Temodar refilled.  Rx refill refused.  Appt is already scheduled.

## 2021-01-23 ENCOUNTER — Other Ambulatory Visit: Payer: Self-pay | Admitting: *Deleted

## 2021-01-23 ENCOUNTER — Telehealth: Payer: Self-pay | Admitting: *Deleted

## 2021-01-23 NOTE — Telephone Encounter (Signed)
Patient missed therapy appt today stating they had a funeral to attend.

## 2021-01-27 ENCOUNTER — Inpatient Hospital Stay (HOSPITAL_BASED_OUTPATIENT_CLINIC_OR_DEPARTMENT_OTHER): Payer: Medicare Other | Admitting: Internal Medicine

## 2021-01-27 ENCOUNTER — Other Ambulatory Visit (HOSPITAL_COMMUNITY): Payer: Self-pay

## 2021-01-27 ENCOUNTER — Inpatient Hospital Stay: Payer: Medicare Other

## 2021-01-27 ENCOUNTER — Other Ambulatory Visit: Payer: Self-pay

## 2021-01-27 VITALS — BP 115/76 | HR 68 | Temp 97.9°F | Resp 16 | Ht 66.0 in | Wt 128.5 lb

## 2021-01-27 DIAGNOSIS — G40909 Epilepsy, unspecified, not intractable, without status epilepticus: Secondary | ICD-10-CM | POA: Diagnosis not present

## 2021-01-27 DIAGNOSIS — C711 Malignant neoplasm of frontal lobe: Secondary | ICD-10-CM

## 2021-01-27 LAB — CBC WITH DIFFERENTIAL (CANCER CENTER ONLY)
Abs Immature Granulocytes: 0.34 10*3/uL — ABNORMAL HIGH (ref 0.00–0.07)
Basophils Absolute: 0 10*3/uL (ref 0.0–0.1)
Basophils Relative: 0 %
Eosinophils Absolute: 0 10*3/uL (ref 0.0–0.5)
Eosinophils Relative: 0 %
HCT: 37.2 % (ref 36.0–46.0)
Hemoglobin: 13.1 g/dL (ref 12.0–15.0)
Immature Granulocytes: 2 %
Lymphocytes Relative: 7 %
Lymphs Abs: 1.1 10*3/uL (ref 0.7–4.0)
MCH: 33.9 pg (ref 26.0–34.0)
MCHC: 35.2 g/dL (ref 30.0–36.0)
MCV: 96.1 fL (ref 80.0–100.0)
Monocytes Absolute: 0.7 10*3/uL (ref 0.1–1.0)
Monocytes Relative: 5 %
Neutro Abs: 12.6 10*3/uL — ABNORMAL HIGH (ref 1.7–7.7)
Neutrophils Relative %: 86 %
Platelet Count: 168 10*3/uL (ref 150–400)
RBC: 3.87 MIL/uL (ref 3.87–5.11)
RDW: 13.7 % (ref 11.5–15.5)
WBC Count: 14.7 10*3/uL — ABNORMAL HIGH (ref 4.0–10.5)
nRBC: 0 % (ref 0.0–0.2)

## 2021-01-27 LAB — CMP (CANCER CENTER ONLY)
ALT: 22 U/L (ref 0–44)
AST: 11 U/L — ABNORMAL LOW (ref 15–41)
Albumin: 3 g/dL — ABNORMAL LOW (ref 3.5–5.0)
Alkaline Phosphatase: 87 U/L (ref 38–126)
Anion gap: 11 (ref 5–15)
BUN: 16 mg/dL (ref 8–23)
CO2: 26 mmol/L (ref 22–32)
Calcium: 8.3 mg/dL — ABNORMAL LOW (ref 8.9–10.3)
Chloride: 100 mmol/L (ref 98–111)
Creatinine: 0.62 mg/dL (ref 0.44–1.00)
GFR, Estimated: >60 mL/min (ref >60)
Glucose, Bld: 105 mg/dL — ABNORMAL HIGH (ref 70–99)
Potassium: 4.6 mmol/L (ref 3.5–5.1)
Sodium: 137 mmol/L (ref 135–145)
Total Bilirubin: 1.2 mg/dL (ref 0.3–1.2)
Total Protein: 5.6 g/dL — ABNORMAL LOW (ref 6.5–8.1)

## 2021-01-27 MED ORDER — TEMOZOLOMIDE 100 MG PO CAPS
200.0000 mg | ORAL_CAPSULE | Freq: Every day | ORAL | 0 refills | Status: DC
  Filled 2021-01-27: qty 10, 5d supply, fill #0

## 2021-01-27 MED ORDER — TEMOZOLOMIDE 20 MG PO CAPS
20.0000 mg | ORAL_CAPSULE | Freq: Every day | ORAL | 0 refills | Status: DC
  Filled 2021-01-27: qty 5, 5d supply, fill #0

## 2021-01-27 NOTE — Progress Notes (Signed)
[place] at [place], [place] [address] [phone]   Interval Evaluation  Date of Service: 01/27/21 Patient Name: Darlene Marsh Patient MRN: [id number] Patient DOB: Oct 09, 1950 Provider: Ventura Sellers, MD  Identifying Statement:  Darlene Marsh is a 70 y.o. female with right frontal glioblastoma   Oncologic History: Oncology History  Glioblastoma multiforme of frontal lobe ([place])  09/05/2020 Surgery   Craniotomy, resection with Dr. Marcello Moores; path demonstrates Glioblastoma IDH-wt   10/07/2020 - 11/19/2020 Radiation Therapy   IMRT and Temozolomide 26m/m2 with Dr. SIsidore Moos  12/26/2020 -  Chemotherapy    Patient is on Treatment Plan: BRAIN GLIOBLASTOMA CONSOLIDATION TEMOZOLOMIDE DAYS 1-5 Q28 DAYS         Dexamethasone 11/11/20: 1450m06/16/22: 50m350m6/23/22: 2mg28m/27/22: 12mg95mrupt clinical changes) 12/05/20: 8mg 065m4/22: 6mg 07150m/22: 2mg  Bi47mrkers:  MGMT Unknown.  IDH 1/2 Wild type.  EGFR Unknown  TERT Unknown   Interval History:  Darlene Marsh today for follow up, now having completed first cycle of 5-day Temodar.  She did experience a seizure cluster several weeks ago, had Keppra increased and was started on decadron 50mg twic66mer day.  She mainly complains of weight gain, fatigue since that time.  No further recurrence of seizures.  No issues tolerating Temodar.  H+P (09/26/20) Patient presented to medical attention earlier this month with new onset seizure episode, characterized as "left arm shaking and tremoring, with some involvement of the leg".  Prior to this she had no complaints, and was independent.  CNS imaging demonstrated nodularly enhancing right frontal mass.  She underwent craniotomy, resection with Dr. Thomas onMarcello Moores2; path demonstrated glioblastoma IDH-wt.  Following surgery, she experienced some left sided weakness and was discharged for inpatient rehab course.  After discharge this past  week, she is close to her baseline, still using a walker for some ambulation.  Otherwise no additional seizures, no headaches.  Medications: Current Outpatient Medications on File Prior to Visit  Medication Sig Dispense Refill   levETIRAcetam (KEPPRA) 1000 MG tablet Take 1 tablet (1,000 mg total) by mouth 2 (two) times daily. 60 tablet 0   dexamethasone (DECADRON) 4 MG tablet Take 1 tablet by mouth 2 (two) times daily.     ondansetron (ZOFRAN) 8 MG tablet Take 1 tablet (8 mg total) by mouth 2 (two) times daily as needed (nausea and vomiting). May take 30-60 minutes prior to Temodar administration if nausea/vomiting occurs. (Patient not taking: Reported on 01/27/2021) 30 tablet 1   temozolomide (TEMODAR) 100 MG capsule Take 2 capsules (200 mg total) by mouth daily. May take on an empty stomach to decrease nausea & vomiting. (Patient not taking: Reported on 01/27/2021) 10 capsule 0   temozolomide (TEMODAR) 20 MG capsule Take 1 capsule (20 mg total) by mouth daily. May take on an empty stomach to decrease nausea & vomiting. (Patient not taking: Reported on 01/27/2021) 5 capsule 0   No current facility-administered medications on file prior to visit.    Allergies: No Known Allergies Past Medical History:  Past Medical History:  Diagnosis Date   Hyperlipidemia    Hypertension    Thyroid disease    underactive   Tremors of nervous system    Past Surgical History:  Past Surgical History:  Procedure Laterality Date   APPLICATION OF CRANIAL NAVIGATION Right 09/05/2020   Procedure: APPLICATION OF CRANIAL NAVIGATION;  Surgeon: Thomas, JVallarie Marecation: [facility] OR;  [place]: Neurosurgery;  Laterality: Right;  CRANIOTOMY Right 09/05/2020   Procedure: Craniotomy - right Frontal interhemispheric approach for tumor resection with BRAIN LAB;  Surgeon: Vallarie Mare, MD;  Location: [place];  Service: Neurosurgery;  Laterality: Right;   Social History:  Social History   Socioeconomic History    Marital status: Married    Spouse name: Not on file   Number of children: Not on file   Years of education: Not on file   Highest education level: Not on file  Occupational History   Not on file  Tobacco Use   Smoking status: Never   Smokeless tobacco: Never  Vaping Use   Vaping Use: Never used  Substance and Sexual Activity   Alcohol use: Never   Drug use: Never   Sexual activity: Not on file  Other Topics Concern   Not on file  Social History Narrative   Not on file   Social Determinants of Health   Financial Resource Strain: Not on file  Food Insecurity: Not on file  Transportation Needs: Not on file  Physical Activity: Not on file  Stress: Not on file  Social Connections: Not on file  Intimate Partner Violence: Not on file   Family History:  Family History  Problem Relation Age of Onset   Cancer Mother        Breast cancer   Cancer Sister        Breast   Cancer Sister        Breast    Review of Systems: Constitutional: Doesn't report fevers, chills or abnormal weight loss Eyes: Doesn't report blurriness of vision Ears, nose, mouth, throat, and face: Doesn't report sore throat Respiratory: Doesn't report cough, dyspnea or wheezes Cardiovascular: Doesn't report palpitation, chest discomfort  Gastrointestinal:  Doesn't report nausea, constipation, diarrhea GU: Doesn't report incontinence Skin: Doesn't report skin rashes Neurological: Per HPI Musculoskeletal: Doesn't report joint pain Behavioral/Psych: Doesn't report anxiety  Physical Exam: Vitals:   01/27/21 1017  BP: 115/76  Pulse: 68  Resp: 16  Temp: 97.9 F (36.6 C)  SpO2: 100%    KPS: 80. General: Alert, cooperative, pleasant, in no acute distress Head: Normal EENT: No conjunctival injection or scleral icterus.  Lungs: Resp effort normal Cardiac: Regular rate Abdomen: Non-distended abdomen Skin: No rashes cyanosis or petechiae. Extremities: Left foot lateralized edema  Neurologic  Exam: Mental Status: Awake, alert, attentive to examiner. Oriented to self and environment. Language is fluent with intact comprehension.  Cranial Nerves: Visual acuity is grossly normal. Visual fields are full. Extra-ocular movements intact. No ptosis. Face is symmetric Motor: Tone and bulk are normal. Left arm and leg 4+/5. Right sided weakness is functional in nature.  Reflexes are symmetric, no pathologic reflexes present.  Sensory: Intact to light touch Gait: Deferred   Labs: I have reviewed the data as listed    Component Value Date/Time   NA 137 01/27/2021 0956   K 4.6 01/27/2021 0956   CL 100 01/27/2021 0956   CO2 26 01/27/2021 0956   GLUCOSE 105 (H) 01/27/2021 0956   BUN 16 01/27/2021 0956   CREATININE 0.62 01/27/2021 0956   CREATININE 0.74 03/15/2019 0817   CALCIUM 8.3 (L) 01/27/2021 0956   PROT 5.6 (L) 01/27/2021 0956   ALBUMIN 3.0 (L) 01/27/2021 0956   AST 11 (L) 01/27/2021 0956   ALT 22 01/27/2021 0956   ALKPHOS 87 01/27/2021 0956   BILITOT 1.2 01/27/2021 0956   GFRNONAA >60 01/27/2021 0956   GFRNONAA 75 01/19/2018 0939   GFRAA 87 01/19/2018  4144   Lab Results  Component Value Date   WBC 14.7 (H) 01/27/2021   NEUTROABS 12.6 (H) 01/27/2021   HGB 13.1 01/27/2021   HCT 37.2 01/27/2021   MCV 96.1 01/27/2021   PLT 168 01/27/2021    Assessment/Plan Glioblastoma multiforme of frontal lobe ([facility]) [C71.1]  Leahna Hewson is clinically stable today, now having completed 1st cycle of 5-day Temozolomide.  Seizures stabilized on increased dose of Keppra and decadron.  We recommended continuing treatment with Temozolomide 150 mg/m2, on for five days and off for twenty three days in twenty eight day cycles. The patient will have a complete blood count performed on days 21 and 28 of each cycle, and a comprehensive metabolic panel performed on day 28 of each cycle. Labs may need to be performed more often. Zofran will prescribed for home use for nausea/vomiting.    Chemotherapy should be held for the following:  ANC less than 1,000  Platelets less than 100,000  LFT or creatinine greater than 2x ULN  If clinical concerns/contraindications develop  We recommended decreasing decadron to 2m daily x5 days, then 2615mdaily x5 days, then 15m80maily x5 days, then stopping.  Should continue Keppra 1000m45mD.  We ask that PeggJoanny Dupreeurn to clinic in 1 months with MRI brain for evaluation prior to cycle #3, or sooner as needed.  All questions were answered. The patient knows to call the clinic with any problems, questions or concerns. No barriers to learning were detected.  I have spent a total of 30 minutes of face-to-face and non-face-to-face time, excluding clinical staff time, preparing to see patient, ordering tests and/or medications, counseling the patient, and independently interpreting results and communicating results to the patient/family/caregiver    ZachVentura Sellers Medical Director of Neuro-Oncology [facility]29/22 11:49 AM

## 2021-01-31 DIAGNOSIS — I10 Essential (primary) hypertension: Secondary | ICD-10-CM | POA: Diagnosis not present

## 2021-01-31 DIAGNOSIS — E785 Hyperlipidemia, unspecified: Secondary | ICD-10-CM | POA: Diagnosis not present

## 2021-01-31 DIAGNOSIS — R569 Unspecified convulsions: Secondary | ICD-10-CM | POA: Diagnosis not present

## 2021-01-31 DIAGNOSIS — G8194 Hemiplegia, unspecified affecting left nondominant side: Secondary | ICD-10-CM | POA: Diagnosis not present

## 2021-01-31 DIAGNOSIS — G252 Other specified forms of tremor: Secondary | ICD-10-CM | POA: Diagnosis not present

## 2021-01-31 DIAGNOSIS — C711 Malignant neoplasm of frontal lobe: Secondary | ICD-10-CM | POA: Diagnosis not present

## 2021-02-04 DIAGNOSIS — C711 Malignant neoplasm of frontal lobe: Secondary | ICD-10-CM | POA: Diagnosis not present

## 2021-02-04 DIAGNOSIS — E785 Hyperlipidemia, unspecified: Secondary | ICD-10-CM | POA: Diagnosis not present

## 2021-02-04 DIAGNOSIS — R569 Unspecified convulsions: Secondary | ICD-10-CM | POA: Diagnosis not present

## 2021-02-04 DIAGNOSIS — I10 Essential (primary) hypertension: Secondary | ICD-10-CM | POA: Diagnosis not present

## 2021-02-04 DIAGNOSIS — G8194 Hemiplegia, unspecified affecting left nondominant side: Secondary | ICD-10-CM | POA: Diagnosis not present

## 2021-02-04 DIAGNOSIS — G252 Other specified forms of tremor: Secondary | ICD-10-CM | POA: Diagnosis not present

## 2021-02-07 ENCOUNTER — Telehealth: Payer: Self-pay | Admitting: Internal Medicine

## 2021-02-07 NOTE — Telephone Encounter (Signed)
Sch per 8/29 los, pt aware

## 2021-02-10 ENCOUNTER — Telehealth: Payer: Self-pay | Admitting: *Deleted

## 2021-02-10 ENCOUNTER — Other Ambulatory Visit: Payer: Self-pay | Admitting: Internal Medicine

## 2021-02-10 MED ORDER — FUROSEMIDE 20 MG PO TABS: 20.0000 mg | ORAL_TABLET | Freq: Every day | ORAL | 0 refills | Status: DC

## 2021-02-10 NOTE — Telephone Encounter (Signed)
Patients daughter Margreta Journey called.  Patient is almost done with her Decadron taper and is currently on 0.5 mg daily and will finish this Friday.  Daughter states that the swelling in her lower legs has continued to get worse.  Legs are equal in swelling.  Denies having any pain.    Daughter wanted to know what she can do to alleviate the swelling.  They state they currently elevate the legs but that doesn't help with the swelling.    Routed to MD to advise.

## 2021-02-11 ENCOUNTER — Other Ambulatory Visit (HOSPITAL_BASED_OUTPATIENT_CLINIC_OR_DEPARTMENT_OTHER): Payer: Self-pay

## 2021-02-11 ENCOUNTER — Other Ambulatory Visit: Payer: Self-pay | Admitting: Radiation Therapy

## 2021-02-12 ENCOUNTER — Other Ambulatory Visit (HOSPITAL_COMMUNITY): Payer: Self-pay

## 2021-02-12 ENCOUNTER — Telehealth: Payer: Self-pay | Admitting: Internal Medicine

## 2021-02-12 NOTE — Telephone Encounter (Signed)
Sch per 8/29 los, spoke with daughter aware

## 2021-02-13 DIAGNOSIS — I872 Venous insufficiency (chronic) (peripheral): Secondary | ICD-10-CM | POA: Diagnosis not present

## 2021-02-13 DIAGNOSIS — R600 Localized edema: Secondary | ICD-10-CM | POA: Diagnosis not present

## 2021-02-21 ENCOUNTER — Telehealth: Payer: Self-pay

## 2021-02-21 NOTE — Telephone Encounter (Signed)
Margreta Journey made aware of Dr. Renda Rolls recommendations and verbalized understanding.

## 2021-02-21 NOTE — Telephone Encounter (Signed)
-----   Message from Ventura Sellers, MD sent at 02/21/2021 11:21 AM EDT ----- Yes, they can resume 1mg  daily, and increase to 2mg  if that is ineffective.  They should have pills leftover but if not can refill  Ventura Sellers, MD ----- Message ----- From: Burna Mortimer, CMA Sent: 02/21/2021  11:11 AM EDT To: Otila Kluver, RN, Ventura Sellers, MD  Dr. Mickeal Skinner,  I received a voicemail from [place]. Margreta Journey states that [place] has been off of steroids for roughly two weeks. Margreta Journey states that [place] has gotten back to the point where she is having difficulty moving around and standing up/sitting down. Margreta Journey would like to know if you would recommend putting [place] back on the steroids. She also wanted me to let you know that her fluid pills are working great.   Thanks, Myriam Jacobson

## 2021-02-27 ENCOUNTER — Emergency Department (HOSPITAL_COMMUNITY): Payer: Medicare Other

## 2021-02-27 ENCOUNTER — Encounter (HOSPITAL_COMMUNITY)
Admission: EM | Disposition: A | Discharge status: Skilled Nursing Facility | Payer: Self-pay | Source: Home / Self Care | Attending: Internal Medicine

## 2021-02-27 ENCOUNTER — Inpatient Hospital Stay (HOSPITAL_COMMUNITY): Payer: Medicare Other

## 2021-02-27 ENCOUNTER — Encounter (HOSPITAL_COMMUNITY): Payer: Self-pay

## 2021-02-27 ENCOUNTER — Inpatient Hospital Stay (HOSPITAL_COMMUNITY): Payer: Medicare Other | Admitting: Anesthesiology

## 2021-02-27 ENCOUNTER — Other Ambulatory Visit (HOSPITAL_COMMUNITY): Payer: Self-pay

## 2021-02-27 ENCOUNTER — Other Ambulatory Visit: Payer: Self-pay

## 2021-02-27 ENCOUNTER — Inpatient Hospital Stay (HOSPITAL_COMMUNITY)
Admission: EM | Admit: 2021-02-27 | Discharge: 2021-03-04 | DRG: 522 | Disposition: A | Discharge status: Skilled Nursing Facility | Payer: Medicare Other | Attending: Internal Medicine | Admitting: Internal Medicine

## 2021-02-27 DIAGNOSIS — Y92009 Unspecified place in unspecified non-institutional (private) residence as the place of occurrence of the external cause: Secondary | ICD-10-CM

## 2021-02-27 DIAGNOSIS — I10 Essential (primary) hypertension: Secondary | ICD-10-CM | POA: Diagnosis not present

## 2021-02-27 DIAGNOSIS — S72012A Unspecified intracapsular fracture of left femur, initial encounter for closed fracture: Secondary | ICD-10-CM | POA: Diagnosis not present

## 2021-02-27 DIAGNOSIS — Z79899 Other long term (current) drug therapy: Secondary | ICD-10-CM | POA: Diagnosis not present

## 2021-02-27 DIAGNOSIS — Z4789 Encounter for other orthopedic aftercare: Secondary | ICD-10-CM | POA: Diagnosis not present

## 2021-02-27 DIAGNOSIS — D62 Acute posthemorrhagic anemia: Secondary | ICD-10-CM | POA: Diagnosis not present

## 2021-02-27 DIAGNOSIS — R0902 Hypoxemia: Secondary | ICD-10-CM | POA: Diagnosis not present

## 2021-02-27 DIAGNOSIS — S72042A Displaced fracture of base of neck of left femur, initial encounter for closed fracture: Secondary | ICD-10-CM | POA: Diagnosis not present

## 2021-02-27 DIAGNOSIS — R278 Other lack of coordination: Secondary | ICD-10-CM | POA: Diagnosis not present

## 2021-02-27 DIAGNOSIS — S72002D Fracture of unspecified part of neck of left femur, subsequent encounter for closed fracture with routine healing: Secondary | ICD-10-CM | POA: Diagnosis not present

## 2021-02-27 DIAGNOSIS — Z471 Aftercare following joint replacement surgery: Secondary | ICD-10-CM | POA: Diagnosis not present

## 2021-02-27 DIAGNOSIS — W010XXA Fall on same level from slipping, tripping and stumbling without subsequent striking against object, initial encounter: Secondary | ICD-10-CM | POA: Diagnosis present

## 2021-02-27 DIAGNOSIS — C711 Malignant neoplasm of frontal lobe: Secondary | ICD-10-CM | POA: Diagnosis present

## 2021-02-27 DIAGNOSIS — E785 Hyperlipidemia, unspecified: Secondary | ICD-10-CM | POA: Diagnosis not present

## 2021-02-27 DIAGNOSIS — R41841 Cognitive communication deficit: Secondary | ICD-10-CM | POA: Diagnosis not present

## 2021-02-27 DIAGNOSIS — W19XXXA Unspecified fall, initial encounter: Secondary | ICD-10-CM | POA: Diagnosis not present

## 2021-02-27 DIAGNOSIS — R5381 Other malaise: Secondary | ICD-10-CM | POA: Diagnosis not present

## 2021-02-27 DIAGNOSIS — Z20822 Contact with and (suspected) exposure to covid-19: Secondary | ICD-10-CM | POA: Diagnosis present

## 2021-02-27 DIAGNOSIS — S72002A Fracture of unspecified part of neck of left femur, initial encounter for closed fracture: Secondary | ICD-10-CM | POA: Diagnosis not present

## 2021-02-27 DIAGNOSIS — Z803 Family history of malignant neoplasm of breast: Secondary | ICD-10-CM | POA: Diagnosis not present

## 2021-02-27 DIAGNOSIS — M25552 Pain in left hip: Secondary | ICD-10-CM | POA: Diagnosis not present

## 2021-02-27 DIAGNOSIS — S7292XA Unspecified fracture of left femur, initial encounter for closed fracture: Secondary | ICD-10-CM | POA: Diagnosis not present

## 2021-02-27 DIAGNOSIS — G40909 Epilepsy, unspecified, not intractable, without status epilepticus: Secondary | ICD-10-CM

## 2021-02-27 DIAGNOSIS — C719 Malignant neoplasm of brain, unspecified: Secondary | ICD-10-CM | POA: Diagnosis not present

## 2021-02-27 DIAGNOSIS — Z96642 Presence of left artificial hip joint: Secondary | ICD-10-CM | POA: Diagnosis not present

## 2021-02-27 DIAGNOSIS — E876 Hypokalemia: Secondary | ICD-10-CM | POA: Diagnosis not present

## 2021-02-27 DIAGNOSIS — E039 Hypothyroidism, unspecified: Secondary | ICD-10-CM | POA: Diagnosis present

## 2021-02-27 DIAGNOSIS — M24562 Contracture, left knee: Secondary | ICD-10-CM | POA: Diagnosis not present

## 2021-02-27 DIAGNOSIS — D72829 Elevated white blood cell count, unspecified: Secondary | ICD-10-CM | POA: Diagnosis not present

## 2021-02-27 DIAGNOSIS — M6281 Muscle weakness (generalized): Secondary | ICD-10-CM | POA: Diagnosis not present

## 2021-02-27 DIAGNOSIS — R404 Transient alteration of awareness: Secondary | ICD-10-CM | POA: Diagnosis not present

## 2021-02-27 DIAGNOSIS — Z419 Encounter for procedure for purposes other than remedying health state, unspecified: Secondary | ICD-10-CM

## 2021-02-27 DIAGNOSIS — R2689 Other abnormalities of gait and mobility: Secondary | ICD-10-CM | POA: Diagnosis not present

## 2021-02-27 DIAGNOSIS — R251 Tremor, unspecified: Secondary | ICD-10-CM | POA: Diagnosis present

## 2021-02-27 DIAGNOSIS — R569 Unspecified convulsions: Secondary | ICD-10-CM | POA: Diagnosis not present

## 2021-02-27 DIAGNOSIS — M25562 Pain in left knee: Secondary | ICD-10-CM | POA: Diagnosis not present

## 2021-02-27 HISTORY — PX: TOTAL HIP ARTHROPLASTY: SHX124

## 2021-02-27 LAB — COMPREHENSIVE METABOLIC PANEL
ALT: 18 U/L (ref 0–44)
AST: 16 U/L (ref 15–41)
Albumin: 3.5 g/dL (ref 3.5–5.0)
Alkaline Phosphatase: 83 U/L (ref 38–126)
Anion gap: 8 (ref 5–15)
BUN: 9 mg/dL (ref 8–23)
CO2: 30 mmol/L (ref 22–32)
Calcium: 8.7 mg/dL — ABNORMAL LOW (ref 8.9–10.3)
Chloride: 106 mmol/L (ref 98–111)
Creatinine, Ser: 0.56 mg/dL (ref 0.44–1.00)
GFR, Estimated: >60 mL/min (ref >60)
Glucose, Bld: 108 mg/dL — ABNORMAL HIGH (ref 70–99)
Potassium: 3.2 mmol/L — ABNORMAL LOW (ref 3.5–5.1)
Sodium: 144 mmol/L (ref 135–145)
Total Bilirubin: 0.9 mg/dL (ref 0.3–1.2)
Total Protein: 6.3 g/dL — ABNORMAL LOW (ref 6.5–8.1)

## 2021-02-27 LAB — CBC WITH DIFFERENTIAL/PLATELET
Abs Immature Granulocytes: 0.34 10*3/uL — ABNORMAL HIGH (ref 0.00–0.07)
Basophils Absolute: 0.1 10*3/uL (ref 0.0–0.1)
Basophils Relative: 1 %
Eosinophils Absolute: 0 10*3/uL (ref 0.0–0.5)
Eosinophils Relative: 0 %
HCT: 34 % — ABNORMAL LOW (ref 36.0–46.0)
Hemoglobin: 11.5 g/dL — ABNORMAL LOW (ref 12.0–15.0)
Immature Granulocytes: 3 %
Lymphocytes Relative: 13 %
Lymphs Abs: 1.8 10*3/uL (ref 0.7–4.0)
MCH: 33.8 pg (ref 26.0–34.0)
MCHC: 33.8 g/dL (ref 30.0–36.0)
MCV: 100 fL (ref 80.0–100.0)
Monocytes Absolute: 0.8 10*3/uL (ref 0.1–1.0)
Monocytes Relative: 6 %
Neutro Abs: 10.8 10*3/uL — ABNORMAL HIGH (ref 1.7–7.7)
Neutrophils Relative %: 77 %
Platelets: 307 10*3/uL (ref 150–400)
RBC: 3.4 MIL/uL — ABNORMAL LOW (ref 3.87–5.11)
RDW: 13.6 % (ref 11.5–15.5)
WBC: 13.8 10*3/uL — ABNORMAL HIGH (ref 4.0–10.5)
nRBC: 0 % (ref 0.0–0.2)

## 2021-02-27 LAB — TYPE AND SCREEN
ABO/RH(D): O POS
Antibody Screen: NEGATIVE

## 2021-02-27 LAB — SURGICAL PCR SCREEN
MRSA, PCR: NEGATIVE
Staphylococcus aureus: NEGATIVE

## 2021-02-27 LAB — SARS CORONAVIRUS 2 BY RT PCR (HOSPITAL ORDER, PERFORMED IN ~~LOC~~ HOSPITAL LAB): SARS Coronavirus 2: NEGATIVE

## 2021-02-27 LAB — TSH: TSH: 2.325 u[IU]/mL (ref 0.350–4.500)

## 2021-02-27 SURGERY — ARTHROPLASTY, HIP, TOTAL, ANTERIOR APPROACH
Anesthesia: General | Site: Hip | Laterality: Left

## 2021-02-27 MED ORDER — HYDROMORPHONE HCL 1 MG/ML IJ SOLN
0.5000 mg | INTRAMUSCULAR | Status: DC | PRN
  Administered 2021-02-27 (×3): 1 mg via INTRAVENOUS
  Filled 2021-02-27 (×3): qty 1

## 2021-02-27 MED ORDER — PHENYLEPHRINE HCL (PRESSORS) 10 MG/ML IV SOLN
INTRAVENOUS | Status: DC | PRN
  Administered 2021-02-27 (×2): 120 ug via INTRAVENOUS

## 2021-02-27 MED ORDER — CEFAZOLIN SODIUM-DEXTROSE 2-4 GM/100ML-% IV SOLN
2.0000 g | INTRAVENOUS | Status: AC
  Administered 2021-02-27: 2 g via INTRAVENOUS
  Filled 2021-02-27: qty 100

## 2021-02-27 MED ORDER — MORPHINE SULFATE (PF) 2 MG/ML IV SOLN
0.5000 mg | INTRAVENOUS | Status: DC | PRN
  Administered 2021-02-28: 1 mg via INTRAVENOUS
  Filled 2021-02-27: qty 1

## 2021-02-27 MED ORDER — PROPOFOL 10 MG/ML IV BOLUS
INTRAVENOUS | Status: AC
  Filled 2021-02-27: qty 20

## 2021-02-27 MED ORDER — SENNA 8.6 MG PO TABS
1.0000 | ORAL_TABLET | Freq: Two times a day (BID) | ORAL | Status: DC
  Administered 2021-02-28 – 2021-03-03 (×8): 8.6 mg via ORAL
  Filled 2021-02-27 (×8): qty 1

## 2021-02-27 MED ORDER — ACETAMINOPHEN 325 MG PO TABS: 325.0000 mg | ORAL_TABLET | Freq: Four times a day (QID) | ORAL | Status: DC | PRN

## 2021-02-27 MED ORDER — KETOROLAC TROMETHAMINE 30 MG/ML IJ SOLN
INTRAMUSCULAR | Status: DC | PRN
  Administered 2021-02-27: 30 mg

## 2021-02-27 MED ORDER — ROCURONIUM BROMIDE 10 MG/ML (PF) SYRINGE
PREFILLED_SYRINGE | INTRAVENOUS | Status: AC
  Filled 2021-02-27: qty 10

## 2021-02-27 MED ORDER — TRANEXAMIC ACID-NACL 1000-0.7 MG/100ML-% IV SOLN
1000.0000 mg | INTRAVENOUS | Status: AC
  Administered 2021-02-27: 1000 mg via INTRAVENOUS
  Filled 2021-02-27: qty 100

## 2021-02-27 MED ORDER — METHOCARBAMOL 500 MG IVPB - SIMPLE MED
500.0000 mg | Freq: Four times a day (QID) | INTRAVENOUS | Status: DC | PRN
  Filled 2021-02-27: qty 50

## 2021-02-27 MED ORDER — LEVETIRACETAM 500 MG PO TABS
1000.0000 mg | ORAL_TABLET | Freq: Two times a day (BID) | ORAL | Status: DC
  Administered 2021-02-27 – 2021-03-03 (×10): 1000 mg via ORAL
  Filled 2021-02-27 (×10): qty 2

## 2021-02-27 MED ORDER — DEXAMETHASONE 0.5 MG PO TABS
1.0000 mg | ORAL_TABLET | Freq: Every day | ORAL | Status: DC
  Administered 2021-02-27: 1 mg via ORAL
  Filled 2021-02-27: qty 2

## 2021-02-27 MED ORDER — STERILE WATER FOR IRRIGATION IR SOLN
Status: DC | PRN
  Administered 2021-02-27: 2000 mL

## 2021-02-27 MED ORDER — METHOCARBAMOL 500 MG PO TABS
500.0000 mg | ORAL_TABLET | Freq: Four times a day (QID) | ORAL | Status: DC | PRN
  Administered 2021-03-02 – 2021-03-03 (×3): 500 mg via ORAL
  Filled 2021-02-27 (×3): qty 1

## 2021-02-27 MED ORDER — OXYCODONE HCL 5 MG/5ML PO SOLN: 5.0000 mg | Freq: Once | ORAL | Status: DC | PRN

## 2021-02-27 MED ORDER — SODIUM CHLORIDE (PF) 0.9 % IJ SOLN
INTRAMUSCULAR | Status: DC | PRN
  Administered 2021-02-27: 30 mL

## 2021-02-27 MED ORDER — OXYCODONE HCL 5 MG PO TABS: 5.0000 mg | ORAL_TABLET | Freq: Once | ORAL | Status: DC | PRN

## 2021-02-27 MED ORDER — ONDANSETRON HCL 4 MG/2ML IJ SOLN
INTRAMUSCULAR | Status: AC
  Filled 2021-02-27: qty 2

## 2021-02-27 MED ORDER — CHLORHEXIDINE GLUCONATE 4 % EX LIQD: 60.0000 mL | Freq: Once | CUTANEOUS | Status: DC

## 2021-02-27 MED ORDER — HYDROMORPHONE HCL 1 MG/ML IJ SOLN: 0.2500 mg | INTRAMUSCULAR | Status: DC | PRN

## 2021-02-27 MED ORDER — KETOROLAC TROMETHAMINE 30 MG/ML IJ SOLN
INTRAMUSCULAR | Status: AC
  Filled 2021-02-27: qty 1

## 2021-02-27 MED ORDER — ONDANSETRON HCL 4 MG/2ML IJ SOLN: 4.0000 mg | Freq: Four times a day (QID) | INTRAMUSCULAR | Status: DC | PRN

## 2021-02-27 MED ORDER — ROCURONIUM BROMIDE 100 MG/10ML IV SOLN
INTRAVENOUS | Status: DC | PRN
  Administered 2021-02-27: 70 mg via INTRAVENOUS

## 2021-02-27 MED ORDER — FENTANYL CITRATE (PF) 250 MCG/5ML IJ SOLN
INTRAMUSCULAR | Status: AC
  Filled 2021-02-27: qty 5

## 2021-02-27 MED ORDER — BUPIVACAINE-EPINEPHRINE 0.25% -1:200000 IJ SOLN
INTRAMUSCULAR | Status: DC | PRN
  Administered 2021-02-27: 30 mL

## 2021-02-27 MED ORDER — FENTANYL CITRATE PF 50 MCG/ML IJ SOSY
100.0000 ug | PREFILLED_SYRINGE | Freq: Once | INTRAMUSCULAR | Status: AC
  Administered 2021-02-27: 100 ug via INTRAVENOUS
  Filled 2021-02-27: qty 2

## 2021-02-27 MED ORDER — HYDROCODONE-ACETAMINOPHEN 5-325 MG PO TABS
1.0000 | ORAL_TABLET | ORAL | Status: DC | PRN
  Administered 2021-02-28 (×3): 2 via ORAL
  Administered 2021-03-01: 1 via ORAL
  Administered 2021-03-02: 2 via ORAL
  Administered 2021-03-02 – 2021-03-03 (×4): 1 via ORAL
  Filled 2021-02-27 (×2): qty 1
  Filled 2021-02-27 (×2): qty 2
  Filled 2021-02-27: qty 1
  Filled 2021-02-27: qty 2
  Filled 2021-02-27 (×2): qty 1
  Filled 2021-02-27: qty 2

## 2021-02-27 MED ORDER — SUGAMMADEX SODIUM 200 MG/2ML IV SOLN
INTRAVENOUS | Status: DC | PRN
  Administered 2021-02-27: 122.4 mg via INTRAVENOUS

## 2021-02-27 MED ORDER — METOCLOPRAMIDE HCL 5 MG/ML IJ SOLN: 5.0000 mg | Freq: Three times a day (TID) | INTRAMUSCULAR | Status: DC | PRN

## 2021-02-27 MED ORDER — BOOST / RESOURCE BREEZE PO LIQD CUSTOM
1.0000 | Freq: Two times a day (BID) | ORAL | Status: DC
  Administered 2021-02-28 – 2021-03-03 (×6): 1 via ORAL

## 2021-02-27 MED ORDER — ENSURE SURGERY PO LIQD: 237.0000 mL | Freq: Two times a day (BID) | ORAL | Status: DC

## 2021-02-27 MED ORDER — PHENYLEPHRINE HCL (PRESSORS) 10 MG/ML IV SOLN
INTRAVENOUS | Status: AC
  Filled 2021-02-27: qty 2

## 2021-02-27 MED ORDER — TRANEXAMIC ACID-NACL 1000-0.7 MG/100ML-% IV SOLN
1000.0000 mg | Freq: Once | INTRAVENOUS | Status: AC
  Administered 2021-02-27: 1000 mg via INTRAVENOUS
  Filled 2021-02-27: qty 100

## 2021-02-27 MED ORDER — ONDANSETRON HCL 4 MG/2ML IJ SOLN: 4.0000 mg | Freq: Once | INTRAMUSCULAR | Status: DC | PRN

## 2021-02-27 MED ORDER — ISOPROPYL ALCOHOL 70 % SOLN
Status: DC | PRN
  Administered 2021-02-27: 1 via TOPICAL

## 2021-02-27 MED ORDER — CEFAZOLIN SODIUM-DEXTROSE 2-4 GM/100ML-% IV SOLN
2.0000 g | Freq: Four times a day (QID) | INTRAVENOUS | Status: AC
  Administered 2021-02-27 – 2021-02-28 (×2): 2 g via INTRAVENOUS
  Filled 2021-02-27 (×2): qty 100

## 2021-02-27 MED ORDER — PHENYLEPHRINE 40 MCG/ML (10ML) SYRINGE FOR IV PUSH (FOR BLOOD PRESSURE SUPPORT)
PREFILLED_SYRINGE | INTRAVENOUS | Status: AC
  Filled 2021-02-27: qty 10

## 2021-02-27 MED ORDER — SODIUM CHLORIDE (PF) 0.9 % IJ SOLN
INTRAMUSCULAR | Status: DC | PRN
  Administered 2021-02-27: 50 mL

## 2021-02-27 MED ORDER — ADULT MULTIVITAMIN W/MINERALS CH: 1.0000 | ORAL_TABLET | Freq: Every day | ORAL | Status: DC

## 2021-02-27 MED ORDER — PROPOFOL 10 MG/ML IV BOLUS
INTRAVENOUS | Status: DC | PRN
  Administered 2021-02-27: 120 mg via INTRAVENOUS

## 2021-02-27 MED ORDER — FENTANYL CITRATE (PF) 100 MCG/2ML IJ SOLN
INTRAMUSCULAR | Status: DC | PRN
  Administered 2021-02-27: 150 ug via INTRAVENOUS
  Administered 2021-02-27: 50 ug via INTRAVENOUS

## 2021-02-27 MED ORDER — ASPIRIN EC 325 MG PO TBEC
325.0000 mg | DELAYED_RELEASE_TABLET | Freq: Every day | ORAL | Status: DC
  Administered 2021-02-28 – 2021-03-03 (×4): 325 mg via ORAL
  Filled 2021-02-27 (×4): qty 1

## 2021-02-27 MED ORDER — WATER FOR IRRIGATION, STERILE IR SOLN
Status: DC | PRN
  Administered 2021-02-27: 1000 mL via INTRACRANIAL

## 2021-02-27 MED ORDER — POTASSIUM CHLORIDE CRYS ER 20 MEQ PO TBCR
40.0000 meq | EXTENDED_RELEASE_TABLET | Freq: Once | ORAL | Status: AC
  Administered 2021-02-27: 40 meq via ORAL
  Filled 2021-02-27: qty 2

## 2021-02-27 MED ORDER — MIDAZOLAM HCL 5 MG/5ML IJ SOLN
INTRAMUSCULAR | Status: DC | PRN
  Administered 2021-02-27: .5 mg via INTRAVENOUS

## 2021-02-27 MED ORDER — LIDOCAINE 2% (20 MG/ML) 5 ML SYRINGE
INTRAMUSCULAR | Status: DC | PRN
  Administered 2021-02-27: 50 mg via INTRAVENOUS

## 2021-02-27 MED ORDER — DEXAMETHASONE SODIUM PHOSPHATE 4 MG/ML IJ SOLN
INTRAMUSCULAR | Status: DC | PRN
  Administered 2021-02-27: 8 mg via INTRAVENOUS

## 2021-02-27 MED ORDER — PHENOL 1.4 % MT LIQD: 1.0000 | OROMUCOSAL | Status: DC | PRN

## 2021-02-27 MED ORDER — PROSOURCE PLUS PO LIQD: 30.0000 mL | Freq: Every day | ORAL | Status: DC

## 2021-02-27 MED ORDER — HYDROCODONE-ACETAMINOPHEN 7.5-325 MG PO TABS: 1.0000 | ORAL_TABLET | ORAL | Status: DC | PRN

## 2021-02-27 MED ORDER — ONDANSETRON HCL 4 MG/2ML IJ SOLN
INTRAMUSCULAR | Status: DC | PRN
  Administered 2021-02-27: 4 mg via INTRAVENOUS

## 2021-02-27 MED ORDER — CHLORHEXIDINE GLUCONATE 0.12 % MT SOLN: 15.0000 mL | OROMUCOSAL | Status: DC

## 2021-02-27 MED ORDER — POVIDONE-IODINE 10 % EX SWAB: 2.0000 "application " | Freq: Once | CUTANEOUS | Status: DC

## 2021-02-27 MED ORDER — DEXAMETHASONE SODIUM PHOSPHATE 10 MG/ML IJ SOLN
INTRAMUSCULAR | Status: AC
  Filled 2021-02-27: qty 1

## 2021-02-27 MED ORDER — ONDANSETRON HCL 4 MG PO TABS: 4.0000 mg | ORAL_TABLET | Freq: Four times a day (QID) | ORAL | Status: DC | PRN

## 2021-02-27 MED ORDER — MIDAZOLAM HCL 2 MG/2ML IJ SOLN
INTRAMUSCULAR | Status: AC
  Filled 2021-02-27: qty 2

## 2021-02-27 MED ORDER — LACTATED RINGERS IV SOLN: INTRAVENOUS | Status: DC

## 2021-02-27 MED ORDER — ACETAMINOPHEN 500 MG PO TABS
1000.0000 mg | ORAL_TABLET | Freq: Once | ORAL | Status: AC
  Administered 2021-02-27: 1000 mg via ORAL
  Filled 2021-02-27: qty 2

## 2021-02-27 MED ORDER — HYDROMORPHONE HCL 1 MG/ML IJ SOLN
1.0000 mg | INTRAMUSCULAR | Status: DC | PRN
Start: 2021-02-27 — End: 2021-02-27

## 2021-02-27 MED ORDER — DOCUSATE SODIUM 100 MG PO CAPS
100.0000 mg | ORAL_CAPSULE | Freq: Two times a day (BID) | ORAL | Status: DC
  Administered 2021-02-28 – 2021-03-03 (×8): 100 mg via ORAL
  Filled 2021-02-27 (×8): qty 1

## 2021-02-27 MED ORDER — METOCLOPRAMIDE HCL 5 MG PO TABS: 5.0000 mg | ORAL_TABLET | Freq: Three times a day (TID) | ORAL | Status: DC | PRN

## 2021-02-27 MED ORDER — 0.9 % SODIUM CHLORIDE (POUR BTL) OPTIME
TOPICAL | Status: DC | PRN
  Administered 2021-02-27: 1000 mL

## 2021-02-27 MED ORDER — POVIDONE-IODINE 10 % EX SWAB
2.0000 "application " | Freq: Once | CUTANEOUS | Status: AC
  Administered 2021-02-27: 2 via TOPICAL

## 2021-02-27 MED ORDER — MENTHOL 3 MG MT LOZG: 1.0000 | LOZENGE | OROMUCOSAL | Status: DC | PRN

## 2021-02-27 MED ORDER — LIDOCAINE HCL (PF) 2 % IJ SOLN
INTRAMUSCULAR | Status: AC
  Filled 2021-02-27: qty 5

## 2021-02-27 MED ORDER — BUPIVACAINE-EPINEPHRINE (PF) 0.25% -1:200000 IJ SOLN
INTRAMUSCULAR | Status: AC
  Filled 2021-02-27: qty 30

## 2021-02-27 SURGICAL SUPPLY — 61 items
ADH SKNCLS APL OCTYL .7 VIOL (GAUZE/BANDAGES/DRESSINGS) ×1
BAG COUNTER SPONGE SURGICOUNT (BAG) IMPLANT
BAG DECANTER FOR FLEXI CONT (MISCELLANEOUS) IMPLANT
BAG ZIPLOCK 12X15 (MISCELLANEOUS) IMPLANT
BLADE SURG SZ10 CARB STEEL (BLADE) IMPLANT
CHLORAPREP W/TINT 26 (MISCELLANEOUS) ×2 IMPLANT
COVER PERINEAL POST (MISCELLANEOUS) ×2 IMPLANT
COVER SURGICAL LIGHT HANDLE (MISCELLANEOUS) ×2 IMPLANT
CUP SECTOR GRIPTON 50MM (Cup) ×1 IMPLANT
DECANTER SPIKE VIAL GLASS SM (MISCELLANEOUS) ×2 IMPLANT
DERMABOND ADVANCED (GAUZE/BANDAGES/DRESSINGS) ×1
DERMABOND ADVANCED .7 DNX12 (GAUZE/BANDAGES/DRESSINGS) ×2 IMPLANT
DRAPE IMP U-DRAPE 54X76 (DRAPES) ×2 IMPLANT
DRAPE SHEET LG 3/4 BI-LAMINATE (DRAPES) ×6 IMPLANT
DRAPE STERI IOBAN 125X83 (DRAPES) IMPLANT
DRAPE U-SHAPE 47X51 STRL (DRAPES) ×4 IMPLANT
DRESSING AQUACEL AG SP 3.5X10 (GAUZE/BANDAGES/DRESSINGS) IMPLANT
DRSG AQUACEL AG ADV 3.5X10 (GAUZE/BANDAGES/DRESSINGS) ×2 IMPLANT
DRSG AQUACEL AG SP 3.5X10 (GAUZE/BANDAGES/DRESSINGS) ×2
ELECT REM PT RETURN 15FT ADLT (MISCELLANEOUS) ×2 IMPLANT
GAUZE SPONGE 4X4 12PLY STRL (GAUZE/BANDAGES/DRESSINGS) ×2 IMPLANT
GLOVE SRG 8 PF TXTR STRL LF DI (GLOVE) ×1 IMPLANT
GLOVE SURG ENC MOIS LTX SZ8.5 (GLOVE) ×4 IMPLANT
GLOVE SURG ENC TEXT LTX SZ7.5 (GLOVE) ×4 IMPLANT
GLOVE SURG UNDER POLY LF SZ8 (GLOVE) ×2
GLOVE SURG UNDER POLY LF SZ8.5 (GLOVE) ×2 IMPLANT
GOWN SPEC L3 XXLG W/TWL (GOWN DISPOSABLE) ×2 IMPLANT
GOWN STRL REUS W/TWL XL LVL3 (GOWN DISPOSABLE) ×2 IMPLANT
HANDPIECE INTERPULSE COAX TIP (DISPOSABLE) ×2
HEAD FEMORAL 32 CERAMIC (Hips) ×1 IMPLANT
HOLDER FOLEY CATH W/STRAP (MISCELLANEOUS) ×2 IMPLANT
HOOD PEEL AWAY FLYTE STAYCOOL (MISCELLANEOUS) ×8 IMPLANT
JET LAVAGE IRRISEPT WOUND (IRRIGATION / IRRIGATOR)
KIT TURNOVER KIT A (KITS) ×2 IMPLANT
LAVAGE JET IRRISEPT WOUND (IRRIGATION / IRRIGATOR) IMPLANT
LINER ACETABULAR 32X50 (Liner) ×1 IMPLANT
MANIFOLD NEPTUNE II (INSTRUMENTS) ×2 IMPLANT
MARKER SKIN DUAL TIP RULER LAB (MISCELLANEOUS) ×2 IMPLANT
NDL SAFETY ECLIPSE 18X1.5 (NEEDLE) ×1 IMPLANT
NDL SPNL 18GX3.5 QUINCKE PK (NEEDLE) ×1 IMPLANT
NEEDLE HYPO 18GX1.5 SHARP (NEEDLE) ×2
NEEDLE SPNL 18GX3.5 QUINCKE PK (NEEDLE) ×2 IMPLANT
PACK ANTERIOR HIP CUSTOM (KITS) ×2 IMPLANT
PENCIL SMOKE EVACUATOR (MISCELLANEOUS) IMPLANT
SAW OSC TIP CART 19.5X105X1.3 (SAW) ×2 IMPLANT
SEALER BIPOLAR AQUA 6.0 (INSTRUMENTS) ×2 IMPLANT
SET HNDPC FAN SPRY TIP SCT (DISPOSABLE) ×1 IMPLANT
STEM TRI LOC BPS GRIPTON SZ 5 (Hips) IMPLANT
SUT MNCRL AB 3-0 PS2 18 (SUTURE) ×2 IMPLANT
SUT MNCRL AB 4-0 PS2 18 (SUTURE) ×2 IMPLANT
SUT MON AB 2-0 CT1 36 (SUTURE) ×4 IMPLANT
SUT STRATAFIX PDO 1 14 VIOLET (SUTURE) ×2
SUT STRATFX PDO 1 14 VIOLET (SUTURE) ×1
SUT VIC AB 2-0 CT1 27 (SUTURE) ×2
SUT VIC AB 2-0 CT1 TAPERPNT 27 (SUTURE) ×1 IMPLANT
SUTURE STRATFX PDO 1 14 VIOLET (SUTURE) ×1 IMPLANT
SYR 3ML LL SCALE MARK (SYRINGE) ×2 IMPLANT
TRAY FOLEY MTR SLVR 16FR STAT (SET/KITS/TRAYS/PACK) IMPLANT
TRI LOC BPS W GRIPTON SZ 5 (Hips) ×2 IMPLANT
TUBE SUCTION HIGH CAP CLEAR NV (SUCTIONS) ×2 IMPLANT
WATER STERILE IRR 1000ML POUR (IV SOLUTION) ×2 IMPLANT

## 2021-02-27 NOTE — Anesthesia Procedure Notes (Signed)
Procedure Name: Intubation Date/Time: 02/27/2021 5:29 PM Performed by: Lissa Morales, CRNA Pre-anesthesia Checklist: Patient identified, Emergency Drugs available, Suction available and Patient being monitored Patient Re-evaluated:Patient Re-evaluated prior to induction Oxygen Delivery Method: Circle system utilized Preoxygenation: Pre-oxygenation with 100% oxygen Induction Type: IV induction Ventilation: Mask ventilation without difficulty Laryngoscope Size: Glidescope and 3 Grade View: Grade III Tube type: Oral Tube size: 7.0 mm Number of attempts: 1 Airway Equipment and Method: Stylet, Oral airway and Video-laryngoscopy Placement Confirmation: ETT inserted through vocal cords under direct vision, positive ETCO2 and breath sounds checked- equal and bilateral Secured at: 21 cm Tube secured with: Tape Dental Injury: Teeth and Oropharynx as per pre-operative assessment

## 2021-02-27 NOTE — ED Notes (Signed)
ED TO INPATIENT HANDOFF REPORT  Name/Age/Gender Darlene Marsh 70 y.o. female  Code Status    Code Status Orders  (From admission, onward)           Start     Ordered   02/27/21 0543  Full code  Continuous        02/27/21 0545           Code Status History     Date Active Date Inactive Code Status Order ID Comments User Context   01/02/2021 2132 01/03/2021 1546 Full Code [id number]  Barb Merino, MD Inpatient   09/07/2020 1731 09/20/2020 1641 Full Code [id number]  Charlett Blake, MD Inpatient   09/05/2020 1800 09/07/2020 1723 Full Code [id number]  Vallarie Mare, MD Inpatient       Home/SNF/Other Home  Chief Complaint Closed displaced fracture of left femoral neck ([place]) [S72.002A]  Level of Care/Admitting Diagnosis ED Disposition     ED Disposition  Admit   Condition  --   [place]: [place] [[id number]]  Level of Care: Med-Surg [16]  May admit patient to [facility] or [facility] if equivalent level of care is available:: No  Covid Evaluation: Asymptomatic Screening Protocol (No Symptoms)  Diagnosis: Closed displaced fracture of left femoral neck [facility]) [[id number]]  Admitting Physician: Etta Quill [[id number]]  Attending Physician: Etta Quill [id number]  Estimated length of stay: inpatient only procedure  Certification:: I certify this patient is being admitted for an inpatient-only procedure          Medical History Past Medical History:  Diagnosis Date   Hyperlipidemia    Hypertension    Thyroid disease    underactive   Tremors of nervous system     Allergies No Known Allergies  IV Location/Drains/Wounds Patient Lines/Drains/Airways Status     Active Line/Drains/Airways     Name Placement date Placement time Site Days   Peripheral IV 02/27/21 22 G Left Antecubital 02/27/21  0336  Antecubital  less than 1   Incision (Closed) 09/05/20 Head 09/05/20  1555  -- 175            Labs/Imaging Results  for orders placed or performed during the hospital encounter of 02/27/21 (from the past 48 hour(s))  CBC with Differential     Status: Abnormal   Collection Time: 02/27/21  4:11 AM  Result Value Ref Range   WBC 13.8 (H) 4.0 - 10.5 K/uL   RBC 3.40 (L) 3.87 - 5.11 MIL/uL   Hemoglobin 11.5 (L) 12.0 - 15.0 g/dL   HCT 34.0 (L) 36.0 - 46.0 %   MCV 100.0 80.0 - 100.0 fL   MCH 33.8 26.0 - 34.0 pg   MCHC 33.8 30.0 - 36.0 g/dL   RDW 13.6 11.5 - 15.5 %   Platelets 307 150 - 400 K/uL   nRBC 0.0 0.0 - 0.2 %   Neutrophils Relative % 77 %   Neutro Abs 10.8 (H) 1.7 - 7.7 K/uL   Lymphocytes Relative 13 %   Lymphs Abs 1.8 0.7 - 4.0 K/uL   Monocytes Relative 6 %   Monocytes Absolute 0.8 0.1 - 1.0 K/uL   Eosinophils Relative 0 %   Eosinophils Absolute 0.0 0.0 - 0.5 K/uL   Basophils Relative 1 %   Basophils Absolute 0.1 0.0 - 0.1 K/uL   Immature Granulocytes 3 %   Abs Immature Granulocytes 0.34 (H) 0.00 - 0.07 K/uL    Comment: Performed at [facility], [place] [address] [place], [place] [address]  Comprehensive metabolic panel     Status: Abnormal   Collection Time: 02/27/21  4:11 AM  Result Value Ref Range   Sodium 144 135 - 145 mmol/L   Potassium 3.2 (L) 3.5 - 5.1 mmol/L   Chloride 106 98 - 111 mmol/L   CO2 30 22 - 32 mmol/L   Glucose, Bld 108 (H) 70 - 99 mg/dL    Comment: Glucose reference range applies only to samples taken after fasting for at least 8 hours.   BUN 9 8 - 23 mg/dL   Creatinine, Ser 0.56 0.44 - 1.00 mg/dL   Calcium 8.7 (L) 8.9 - 10.3 mg/dL   Total Protein 6.3 (L) 6.5 - 8.1 g/dL   Albumin 3.5 3.5 - 5.0 g/dL   AST 16 15 - 41 U/L   ALT 18 0 - 44 U/L   Alkaline Phosphatase 83 38 - 126 U/L   Total Bilirubin 0.9 0.3 - 1.2 mg/dL   GFR, Estimated >60 >60 mL/min    Comment: (NOTE) Calculated using the CKD-EPI Creatinine Equation (2021)    Anion gap 8 5 - 15    Comment: Performed at [facility], [place] [address] [place], [place] [address]    DG Chest Portable 1 View  Result Date: 02/27/2021 CLINICAL DATA:  70 year old female with acute left femur fracture. EXAM: PORTABLE CHEST 1 VIEW COMPARISON:  Portable chest 11/27/2020. FINDINGS: Portable AP supine view at 0517 hours. Mildly lower lung volumes. Mediastinal contours remain within normal limits. Mild additional crowding of lung markings but otherwise Allowing for portable technique the lungs are clear. No pneumothorax or pleural effusion identified on this supine view. Visible osseous structures appear intact. Paucity of bowel gas in the upper abdomen. IMPRESSION: Lower lung volumes.  No acute cardiopulmonary abnormality. Electronically Signed   By: Genevie Ann M.D.   On: 02/27/2021 05:26   DG Hip Unilat W or Wo Pelvis 2-3 Views Left  Result Date: 02/27/2021 CLINICAL DATA:  Fall at home with left hip pain EXAM: DG HIP (WITH OR WITHOUT PELVIS) 3V LEFT COMPARISON:  None. FINDINGS: Acute left femoral neck fracture with varus angulation due to displacement. Unremarkable acetabula. IMPRESSION: Displaced left femoral neck fracture. Electronically Signed   By: Jorje Guild M.D.   On: 02/27/2021 05:07    Pending Labs Unresulted Labs (From admission, onward)     Start     Ordered   02/27/21 0551  TSH  Once,   R        02/27/21 0550   02/27/21 0458  SARS CORONAVIRUS 2 (TAT 6-24 HRS) Nasopharyngeal Nasopharyngeal Swab  (Tier 3 - Symptomatic/asymptomatic)  Once,   STAT       Question Answer Comment  Is this test for diagnosis or screening Screening   Symptomatic for COVID-19 as defined by CDC No   Hospitalized for COVID-19 No   Admitted to ICU for COVID-19 No   Previously tested for COVID-19 Yes   Resident in a congregate (group) care setting Unknown   Employed in healthcare setting Unknown   Pregnant No   Has patient completed COVID vaccination(s) (2 doses of Pfizer/Moderna 1 dose of [employer]) Unknown      02/27/21 0457            Vitals/Pain Today's Vitals    02/27/21 0400 02/27/21 0458 02/27/21 0515 02/27/21 0532  BP: (!) 154/85  140/83   Pulse: 93  94   Resp: 18  18   Temp:      TempSrc:      SpO2: 100%  100%   Weight:      Height:      PainSc:  5   3     Isolation Precautions No active isolations  Medications Medications  levETIRAcetam (KEPPRA) tablet 1,000 mg (has no administration in time range)  dexamethasone (DECADRON) tablet 1 mg (has no administration in time range)  HYDROmorphone (DILAUDID) injection 0.5-1 mg (1 mg Intravenous Given 02/27/21 0611)  potassium chloride SA (KLOR-CON) CR tablet 40 mEq (has no administration in time range)  fentaNYL (SUBLIMAZE) injection 100 mcg (100 mcg Intravenous Given 02/27/21 0430)  acetaminophen (TYLENOL) tablet 1,000 mg (1,000 mg Oral Given 02/27/21 0459)    Mobility non-ambulatory

## 2021-02-27 NOTE — ED Provider Notes (Signed)
[facility] Provider Note   CSN: [id number] Arrival date & time: 02/27/21  0327     History Chief Complaint  Patient presents with   Hip Pain    Darlene Marsh is a 70 y.o. female.  The history is provided by the patient.  Hip Pain This is a new problem. The current episode started 1 to 2 hours ago. The problem occurs constantly. The problem has not changed since onset.Pertinent negatives include no chest pain and no abdominal pain. Nothing aggravates the symptoms. Nothing relieves the symptoms. She has tried nothing for the symptoms.      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   Essential hypertension 01/05/2018   Hypothyroidism 08/23/2017   Hyperlipidemia 08/23/2017    Past Surgical History:  Procedure Laterality Date   APPLICATION OF CRANIAL NAVIGATION Right 09/05/2020   Procedure: APPLICATION OF CRANIAL NAVIGATION;  Surgeon: Vallarie Mare, MD;  Location: [place];  Service: Neurosurgery;  Laterality: Right;   CRANIOTOMY Right 09/05/2020   Procedure: Craniotomy - right Frontal interhemispheric approach for tumor resection with BRAIN LAB;  Surgeon: Vallarie Mare, MD;  Location: [place];  Service: Neurosurgery;  Laterality: Right;     OB History     Gravida  3   Para  3   Term      Preterm      AB      Living         SAB      IAB      Ectopic      Multiple      Live Births              Family History  Problem Relation Age of Onset   Cancer Mother        Breast cancer   Cancer Sister        Breast   Cancer Sister        Breast     Social History   Tobacco Use   Smoking status: Never   Smokeless tobacco: Never  Vaping Use   Vaping Use: Never used  Substance Use Topics   Alcohol use: Never   Drug use: Never    Home Medications Prior to Admission medications   Medication Sig Start Date End Date Taking? Authorizing Provider  dexamethasone (DECADRON) 1 MG tablet Take 1 mg by mouth daily. 01/23/21  Yes [provider]  furosemide (LASIX) 20 MG tablet Take 1 tablet (20 mg total) by mouth daily. 02/10/21  Yes Vaslow, Acey Lav, MD  levETIRAcetam (KEPPRA) 1000 MG tablet Take 1 tablet (1,000 mg total) by mouth 2 (two) times daily. 01/03/21 02/27/22 Yes Ghimire, Dante Gang, MD  ondansetron (ZOFRAN) 8 MG tablet Take 1 tablet (8 mg total) by mouth 2 (two) times daily as needed (nausea and vomiting). May take 30-60 minutes prior to Temodar administration if nausea/vomiting occurs. 12/23/20   Ventura Sellers, MD  temozolomide (TEMODAR) 100 MG capsule Take 2 capsules (200 mg total) by mouth daily. May take on an empty  stomach to decrease nausea & vomiting. 01/27/21   Ventura Sellers, MD  temozolomide (TEMODAR) 20 MG capsule Take 1 capsule (20 mg total) by mouth daily. May take on an empty stomach to decrease nausea & vomiting. 01/27/21   Ventura Sellers, MD    Allergies    Patient has no known allergies.  Review of Systems   Review of Systems  Cardiovascular:  Negative for chest pain.  Gastrointestinal:  Negative for abdominal pain.  All other systems reviewed and are negative.  Physical Exam Updated Vital Signs BP 130/72 (BP Location: Right Arm)   Pulse (!) 102   Temp 98.7 F (37.1 C) (Oral)   Resp 16   Ht 5\' 6"  (1.676 m)   Wt 61.2 kg   SpO2 100%   BMI 21.79 kg/m   Physical Exam Vitals and nursing note reviewed.  Constitutional:      Appearance: She is well-developed.  HENT:     Head: Normocephalic and atraumatic.     Mouth/Throat:     Mouth: Mucous membranes are moist.     Pharynx: Oropharynx is  clear.  Eyes:     Pupils: Pupils are equal, round, and reactive to light.  Cardiovascular:     Rate and Rhythm: Normal rate and regular rhythm.  Pulmonary:     Effort: No respiratory distress.     Breath sounds: No stridor.  Abdominal:     General: Abdomen is flat. There is no distension.  Musculoskeletal:        General: Tenderness (pain with ROM of left hip. also mildly shortened.) present. No swelling. Normal range of motion.     Cervical back: Normal range of motion.  Skin:    General: Skin is warm and dry.  Neurological:     General: No focal deficit present.     Mental Status: She is alert.    ED Results / Procedures / Treatments   Labs (all labs ordered are listed, but only abnormal results are displayed) Labs Reviewed  CBC WITH DIFFERENTIAL/PLATELET - Abnormal; Notable for the following components:      Result Value   WBC 13.8 (*)    RBC 3.40 (*)    Hemoglobin 11.5 (*)    HCT 34.0 (*)    Neutro Abs 10.8 (*)    Abs Immature Granulocytes 0.34 (*)    All other components within normal limits  COMPREHENSIVE METABOLIC PANEL - Abnormal; Notable for the following components:   Potassium 3.2 (*)    Glucose, Bld 108 (*)    Calcium 8.7 (*)    Total Protein 6.3 (*)    All other components within normal limits  SARS CORONAVIRUS 2 (TAT 6-24 HRS)  TSH    EKG None  Radiology DG Chest Portable 1 View  Result Date: 02/27/2021 CLINICAL DATA:  70 year old female with acute left femur fracture. EXAM: PORTABLE CHEST 1 VIEW COMPARISON:  Portable chest 11/27/2020. FINDINGS: Portable AP supine view at 0517 hours. Mildly lower lung volumes. Mediastinal contours remain within normal limits. Mild additional crowding of lung markings but otherwise Allowing for portable technique the lungs are clear. No pneumothorax or pleural effusion identified on this supine view. Visible osseous structures appear intact. Paucity of bowel gas in the upper abdomen. IMPRESSION: Lower lung volumes.  No  acute cardiopulmonary abnormality. Electronically Signed   By: Genevie Ann M.D.   On: 02/27/2021 05:26   DG Hip Unilat W or Wo Pelvis 2-3 Views Left  Result Date: 02/27/2021 CLINICAL DATA:  Fall at home with left hip pain EXAM: DG HIP (WITH OR WITHOUT PELVIS) 3V LEFT COMPARISON:  None. FINDINGS: Acute left femoral neck fracture with varus angulation due to displacement. Unremarkable acetabula. IMPRESSION: Displaced left femoral neck fracture. Electronically Signed   By: Jorje Guild M.D.   On: 02/27/2021 05:07    Procedures Procedures   Medications Ordered in ED Medications  levETIRAcetam (KEPPRA) tablet 1,000 mg (has no administration in time range)  dexamethasone (DECADRON) tablet 1 mg (has no administration in time range)  HYDROmorphone (DILAUDID) injection 0.5-1 mg (1 mg Intravenous Given 02/27/21 0611)  potassium chloride SA (KLOR-CON) CR tablet 40 mEq (has no administration in time range)  fentaNYL (SUBLIMAZE) injection 100 mcg (100 mcg Intravenous Given 02/27/21 0430)  acetaminophen (TYLENOL) tablet 1,000 mg (1,000 mg Oral Given 02/27/21 0459)    ED Course  I have reviewed the triage vital signs and the nursing notes.  Pertinent labs & imaging results that were available during my care of the patient were reviewed by me and considered in my medical decision making (see chart for details).    MDM Rules/Calculators/A&P                           70 year old female who has what appears to be a subcapital left hip fracture.  Discussed with Dr. Katherina Right with orthopedics who plans for fixation today.  Request medicine admission.  Medicine for admission.  Pain controlled. NPO.   Final Clinical Impression(s) / ED Diagnoses Final diagnoses:  Closed fracture of left hip, initial encounter [facility])    Rx / [place] Orders ED Discharge Orders     None        Brenton Joines, Corene Cornea, MD 02/27/21 [phone]

## 2021-02-27 NOTE — Anesthesia Preprocedure Evaluation (Addendum)
Anesthesia Evaluation  Patient identified by MRN, date of birth, ID band Patient awake and Patient confused    Reviewed: Allergy & Precautions, NPO status , Patient's Chart, lab work & pertinent test results, reviewed documented beta blocker date and time   Airway Mallampati: II  TM Distance: >3 FB Neck ROM: Full    Dental  (+) Dental Advisory Given, Poor Dentition, Chipped, Partial Upper, Missing,    Pulmonary neg pulmonary ROS,    Pulmonary exam normal breath sounds clear to auscultation       Cardiovascular hypertension, Normal cardiovascular exam Rhythm:Regular Rate:Normal     Neuro/Psych Seizures -, Well Controlled,  Hx/o glioblastoma multiforme frontal lobe S/P craniotomy 09/05/20 Tremors negative psych ROS   GI/Hepatic negative GI ROS, Neg liver ROS,   Endo/Other  Hypothyroidism   Renal/GU negative Renal ROS  negative genitourinary   Musculoskeletal Left femoral neck Fx   Abdominal   Peds  Hematology negative hematology ROS (+)   Anesthesia Other Findings   Reproductive/Obstetrics                            Anesthesia Physical Anesthesia Plan  ASA: 3  Anesthesia Plan: General   Post-op Pain Management:    Induction: Intravenous  PONV Risk Score and Plan: 4 or greater and Treatment may vary due to age or medical condition, Ondansetron and Dexamethasone  Airway Management Planned: Oral ETT and LMA  Additional Equipment:   Intra-op Plan:   Post-operative Plan: Extubation in OR  Informed Consent: I have reviewed the patients History and Physical, chart, labs and discussed the procedure including the risks, benefits and alternatives for the proposed anesthesia with the patient or authorized representative who has indicated his/her understanding and acceptance.     Dental advisory given  Plan Discussed with: CRNA, Anesthesiologist and Surgeon  Anesthesia Plan Comments:          Anesthesia Quick Evaluation

## 2021-02-27 NOTE — ED Triage Notes (Signed)
Pt to ED via [facility] from home with c/o left hip pain s/p fall.  Pt attempted to sit on side of tub and slipped onto floor.  Denies LOC, c-collar in place.  Total of fentanyl 237mcg given in route.

## 2021-02-27 NOTE — Progress Notes (Signed)
I was contacted this morning regarding patient's dispalced left femoral neck fracture and have reviewed xrays. Current plan [place] to patient to be admitted to medicine per protocol and for her to be kept NPO, with fixation either today or tomorrow. The provider who will be performing Darlene Marsh's surgery is not known at this exact moment, but will be figured out soon. Additional updates and full consult will be forthcoming.

## 2021-02-27 NOTE — Interval H&P Note (Signed)
History and Physical Interval Note:  02/27/2021 3:32 PM  Annise Meneely  has presented today for surgery, with the diagnosis of femoral neck fracture.  The various methods of treatment have been discussed with the patient and family. After consideration of risks, benefits and other options for treatment, the patient has consented to  Procedure(s): TOTAL HIP ARTHROPLASTY ANTERIOR APPROACH (Left) as a surgical intervention.  The patient's history has been reviewed, patient examined, no change in status, stable for surgery.  I have reviewed the patient's chart and labs.  Questions were answered to the patient's satisfaction.    The risks, benefits, and alternatives were discussed with the patient. There are risks associated with the surgery including, but not limited to, problems with anesthesia (death), infection, instability (giving out of the joint), dislocation, differences in leg length/angulation/rotation, fracture of bones, loosening or failure of implants, hematoma (blood accumulation) which may require surgical drainage, blood clots, pulmonary embolism, nerve injury (foot drop and lateral thigh numbness), and blood vessel injury. The patient understands these risks and elects to proceed.    Hilton Cork Price [place]

## 2021-02-27 NOTE — Progress Notes (Signed)
Initial Nutrition Assessment  DOCUMENTATION CODES:   Not applicable  INTERVENTION:  - diet advancement as medically feasible. - will order Ensure Surgery BID, each supplement provides 330 kcal and 18 grams of protein. - will order Boost Breeze BID, each supplement provides 250 kcal and 9 grams of protein. - will order 30 ml Prosource Plus once/day, each supplement provides 100 kcal and 15 grams protein.  - will order 1 tablet multivitamin with minerals/day. - complete NFPE when feasible.    NUTRITION DIAGNOSIS:   Increased nutrient needs related to hip fracture, chronic illness as evidenced by estimated needs.  GOAL:   Patient will meet greater than or equal to 90% of their needs  MONITOR:   Diet advancement, PO intake, Supplement acceptance, Labs, Weight trends  REASON FOR ASSESSMENT:   Consult Hip fracture protocol  ASSESSMENT:   70 y.o. female with medical history significant of glioblastoma multiforme diagnosed earlier this year s/p craniotomy and radiation and now on Temodar consolidation chemo, thyroid disease, HTN, HLD, and tremors of the nervous system. She presented to the ED with L hip pain after a mechanical fall at home. In the ED imaging showed a L femoral neck fracture and Orthopedic Surgery was consulted.  She has been NPO since admission. Patient unavailable. She has not been seen by another [place] RD at any time in the past.   Orthopedics is following and is planning for L total hip arthroplasty today 2/2 L femoral neck fracture.   Weight today is 135 lb and appears to be a stated weight. This weight is the highest recorded since prior to 09/07/20. Weight on 01/27/21 was 128 lb and weight on 10/29/20 was 125 lb.    Labs reviewed; K: 3.2 mmol/l, Ca: 8.7 mg/dl. Medications reviewed; 40 mEq Klor-Con x1 dose 9/29.    NUTRITION - FOCUSED PHYSICAL EXAM:  Unable to complete at this time.   Diet Order:   Diet Order             Diet NPO time specified  Except for: Sips with Meds  Diet effective now                   EDUCATION NEEDS:   No education needs have been identified at this time  Skin:  Skin Assessment: Reviewed RN Assessment  Last BM:  unknown  Height:   Ht Readings from Last 1 Encounters:  02/27/21 5\' 6"  (1.676 m)    Weight:   Wt Readings from Last 1 Encounters:  02/27/21 61.2 kg     Estimated Nutritional Needs:  Kcal:  1840-2020 kcal Protein:  90-105 grams Fluid:  >/= 2.1 L/day      Jarome Matin, MS, RD, LDN, CNSC Inpatient Clinical Dietitian RD pager # available in [place]  After hours/weekend pager # available in [facility]

## 2021-02-27 NOTE — Anesthesia Postprocedure Evaluation (Signed)
Anesthesia Post Note  Patient: Darlene Marsh  Procedure(s) Performed: TOTAL HIP ARTHROPLASTY ANTERIOR APPROACH (Left: Hip)     Patient location during evaluation: PACU Anesthesia Type: General Level of consciousness: awake and alert Pain management: pain level controlled Vital Signs Assessment: post-procedure vital signs reviewed and stable Respiratory status: spontaneous breathing, nonlabored ventilation, respiratory function stable and patient connected to nasal cannula oxygen Cardiovascular status: blood pressure returned to baseline and stable Postop Assessment: no apparent nausea or vomiting Anesthetic complications: no   No notable events documented.  Last Vitals:  Vitals:   02/27/21 1930 02/27/21 1945  BP: (!) 150/90 130/67  Pulse: 81 83  Resp: 17 12  Temp:    SpO2: 99% 100%    Last Pain:  Vitals:   02/27/21 1930  TempSrc:   PainSc: 0-No pain                 Shaunie Boehm A.

## 2021-02-27 NOTE — Discharge Instructions (Signed)
? ?Dr. Cheryn Lundquist ?Joint Replacement Specialist ?[facility] ?[address] ?[place], [place] [address] ?[phone] ? ? ?TOTAL HIP REPLACEMENT POSTOPERATIVE DIRECTIONS ? ? ? ?Hip Rehabilitation, Guidelines Following Surgery  ? ?WEIGHT BEARING ?Weight bearing as tolerated with assist device (walker, cane, etc) as directed, use it as long as suggested by your surgeon or therapist, typically at least 4-6 weeks. ? ?The results of a hip operation are greatly improved after range of motion and muscle strengthening exercises. Follow all safety measures which are given to protect your hip. If any of these exercises cause increased pain or swelling in your joint, decrease the amount until you are comfortable again. Then slowly increase the exercises. Call your caregiver if you have problems or questions.  ? ?HOME CARE INSTRUCTIONS  ?Most of the following instructions are designed to prevent the dislocation of your new hip.  ?Remove items at home which could result in a fall. This includes throw rugs or furniture in walking pathways.  ?Continue medications as instructed at time of discharge. ?You may have some home medications which will be placed on hold until you complete the course of blood thinner medication. ?You may start showering once you are discharged home. Do not remove your dressing. ?Do not put on socks or shoes without following the instructions of your caregivers.   ?Sit on chairs with arms. Use the chair arms to help push yourself up when arising.  ?Arrange for the use of a toilet seat elevator so you are not sitting low.  ?Walk with walker as instructed.  ?You may resume a sexual relationship in one month or when given the OK by your caregiver.  ?Use walker as long as suggested by your caregivers.  ?You may put full weight on your legs and walk as much as is comfortable. ?Avoid periods of inactivity such as sitting longer than an hour when not asleep. This helps prevent blood  clots.  ?You may return to work once you are cleared by your surgeon.  ?Do not drive a car for 6 weeks or until released by your surgeon.  ?Do not drive while taking narcotics.  ?Wear elastic stockings for two weeks following surgery during the day but you may remove then at night.  ?Make sure you keep all of your appointments after your operation with all of your doctors and caregivers. You should call the office at the above phone number and make an appointment for approximately two weeks after the date of your surgery. ?Please pick up a stool softener and laxative for home use as long as you are requiring pain medications. ?ICE to the affected hip every three hours for 30 minutes at a time and then as needed for pain and swelling. Continue to use ice on the hip for pain and swelling from surgery. You may notice swelling that will progress down to the foot and ankle.  This is normal after surgery.  Elevate the leg when you are not up walking on it.   ?It is important for you to complete the blood thinner medication as prescribed by your doctor. ?Continue to use the breathing machine which will help keep your temperature down.  It is common for your temperature to cycle up and down following surgery, especially at night when you are not up moving around and exerting yourself.  The breathing machine keeps your lungs expanded and your temperature down. ? ?RANGE OF MOTION AND STRENGTHENING EXERCISES  ?These exercises are designed to help you   keep full movement of your hip joint. Follow your caregiver's or physical therapist's instructions. Perform all exercises about fifteen times, three times per day or as directed. Exercise both hips, even if you have had only one joint replacement. These exercises can be done on a training (exercise) mat, on the floor, on a table or on a bed. Use whatever works the best and is most comfortable for you. Use music or television while you are exercising so that the exercises are a  pleasant break in your day. This will make your life better with the exercises acting as a break in routine you can look forward to.  ?Lying on your back, slowly slide your foot toward your buttocks, raising your knee up off the floor. Then slowly slide your foot back down until your leg is straight again.  ?Lying on your back spread your legs as far apart as you can without causing discomfort.  ?Lying on your side, raise your upper leg and foot straight up from the floor as far as is comfortable. Slowly lower the leg and repeat.  ?Lying on your back, tighten up the muscle in the front of your thigh (quadriceps muscles). You can do this by keeping your leg straight and trying to raise your heel off the floor. This helps strengthen the largest muscle supporting your knee.  ?Lying on your back, tighten up the muscles of your buttocks both with the legs straight and with the knee bent at a comfortable angle while keeping your heel on the floor.  ? ?SKILLED REHAB INSTRUCTIONS: ?If the patient is transferred to a skilled rehab facility following release from the hospital, a list of the current medications will be sent to the facility for the patient to continue.  When discharged from the skilled rehab facility, please have the facility set up the patient's Home Health Physical Therapy prior to being released. Also, the skilled facility will be responsible for providing the patient with their medications at time of release from the facility to include their pain medication and their blood thinner medication. If the patient is still at the rehab facility at time of the two week follow up appointment, the skilled rehab facility will also need to assist the patient in arranging follow up appointment in our office and any transportation needs. ? ?POST-OPERATIVE OPIOID TAPER INSTRUCTIONS: ?It is important to wean off of your opioid medication as soon as possible. If you do not need pain medication after your surgery it is ok  to stop day one. ?Opioids include: ?Codeine, Hydrocodone(Norco, Vicodin), Oxycodone(Percocet, oxycontin) and hydromorphone amongst others.  ?Long term and even short term use of opiods can cause: ?Increased pain response ?Dependence ?Constipation ?Depression ?Respiratory depression ?And more.  ?Withdrawal symptoms can include ?Flu like symptoms ?Nausea, vomiting ?And more ?Techniques to manage these symptoms ?Hydrate well ?Eat regular healthy meals ?Stay active ?Use relaxation techniques(deep breathing, meditating, yoga) ?Do Not substitute Alcohol to help with tapering ?If you have been on opioids for less than two weeks and do not have pain than it is ok to stop all together.  ?Plan to wean off of opioids ?This plan should start within one week post op of your joint replacement. ?Maintain the same interval or time between taking each dose and first decrease the dose.  ?Cut the total daily intake of opioids by one tablet each day ?Next start to increase the time between doses. ?The last dose that should be eliminated is the evening dose.  ? ? ?MAKE   SURE YOU:  ?Understand these instructions.  ?Will watch your condition.  ?Will get help right away if you are not doing well or get worse. ? ?Pick up stool softner and laxative for home use following surgery while on pain medications. ?Do not remove your dressing. ?The dressing is waterproof--it is OK to take showers. ?Continue to use ice for pain and swelling after surgery. ?Do not use any lotions or creams on the incision until instructed by your surgeon. ?Total Hip Protocol. ? ?

## 2021-02-27 NOTE — H&P (View-Only) (Signed)
Reason for Consult:Left hip fx Referring Physician: Marzetta Board Time called: [id number] Time at bedside: [place] is an 70 y.o. female.  HPI: Minaal was in the bathroom last night and fell. She doesn't really remember the circumstances of the fall. She had immediate left hip pain and could not get up. She was brought to the ED where x-rays showed a left femoral neck fx and orthopedic surgery was consulted. She lives at home with her husband and usually ambulates with a RW.  Past Medical History:  Diagnosis Date   Hyperlipidemia    Hypertension    Thyroid disease    underactive   Tremors of nervous system     Past Surgical History:  Procedure Laterality Date   APPLICATION OF CRANIAL NAVIGATION Right 09/05/2020   Procedure: APPLICATION OF CRANIAL NAVIGATION;  Surgeon: Vallarie Mare, MD;  Location: [facility];  Service: Neurosurgery;  Laterality: Right;   CRANIOTOMY Right 09/05/2020   Procedure: Craniotomy - right Frontal interhemispheric approach for tumor resection with BRAIN LAB;  Surgeon: Vallarie Mare, MD;  Location: [facility];  Service: Neurosurgery;  Laterality: Right;    Family History  Problem Relation Age of Onset   Cancer Mother        Breast cancer   Cancer Sister        Breast   Cancer Sister        Breast    Social History:  reports that she has never smoked. She has never used smokeless tobacco. She reports that she does not drink alcohol and does not use drugs.  Allergies: No Known Allergies  Medications: I have reviewed the patient's current medications.  Results for orders placed or performed during the hospital encounter of 02/27/21 (from the past 48 hour(s))  CBC with Differential     Status: Abnormal   Collection Time: 02/27/21  4:11 AM  Result Value Ref Range   WBC 13.8 (H) 4.0 - 10.5 K/uL   RBC 3.40 (L) 3.87 - 5.11 MIL/uL   Hemoglobin 11.5 (L) 12.0 - 15.0 g/dL   HCT 34.0 (L) 36.0 - 46.0 %   MCV 100.0 80.0 - 100.0 fL   MCH 33.8 26.0 - 34.0 pg    MCHC 33.8 30.0 - 36.0 g/dL   RDW 13.6 11.5 - 15.5 %   Platelets 307 150 - 400 K/uL   nRBC 0.0 0.0 - 0.2 %   Neutrophils Relative % 77 %   Neutro Abs 10.8 (H) 1.7 - 7.7 K/uL   Lymphocytes Relative 13 %   Lymphs Abs 1.8 0.7 - 4.0 K/uL   Monocytes Relative 6 %   Monocytes Absolute 0.8 0.1 - 1.0 K/uL   Eosinophils Relative 0 %   Eosinophils Absolute 0.0 0.0 - 0.5 K/uL   Basophils Relative 1 %   Basophils Absolute 0.1 0.0 - 0.1 K/uL   Immature Granulocytes 3 %   Abs Immature Granulocytes 0.34 (H) 0.00 - 0.07 K/uL    Comment: Performed at [facility], [place] [address], [place], [place] [address]  Comprehensive metabolic panel     Status: Abnormal   Collection Time: 02/27/21  4:11 AM  Result Value Ref Range   Sodium 144 135 - 145 mmol/L   Potassium 3.2 (L) 3.5 - 5.1 mmol/L   Chloride 106 98 - 111 mmol/L   CO2 30 22 - 32 mmol/L   Glucose, Bld 108 (H) 70 - 99 mg/dL    Comment: Glucose reference range applies only  to samples taken after fasting for at least 8 hours.   BUN 9 8 - 23 mg/dL   Creatinine, Ser 0.56 0.44 - 1.00 mg/dL   Calcium 8.7 (L) 8.9 - 10.3 mg/dL   Total Protein 6.3 (L) 6.5 - 8.1 g/dL   Albumin 3.5 3.5 - 5.0 g/dL   AST 16 15 - 41 U/L   ALT 18 0 - 44 U/L   Alkaline Phosphatase 83 38 - 126 U/L   Total Bilirubin 0.9 0.3 - 1.2 mg/dL   GFR, Estimated >60 >60 mL/min    Comment: (NOTE) Calculated using the CKD-EPI Creatinine Equation (2021)    Anion gap 8 5 - 15    Comment: Performed at [facility], [place] [address], [place], [place] [address]  TSH     Status: None   Collection Time: 02/27/21  6:10 AM  Result Value Ref Range   TSH 2.325 0.350 - 4.500 uIU/mL    Comment: Performed by a 3rd Generation assay with a functional sensitivity of <=0.01 uIU/mL. Performed at [facility], [place] [address], [place], [place] [address]   Surgical pcr screen     Status: None   Collection Time: 02/27/21  9:05 AM   Specimen: Nasal  Mucosa; Nasal Swab  Result Value Ref Range   MRSA, PCR NEGATIVE NEGATIVE   Staphylococcus aureus NEGATIVE NEGATIVE    Comment: (NOTE) The Xpert SA Assay (FDA approved for NASAL specimens in patients 24 years of age and older), is one component of a comprehensive surveillance program. It is not intended to diagnose infection nor to guide or monitor treatment. Performed at [facility], [place] [address] [place],  [address]     DG Chest Portable 1 View  Result Date: 02/27/2021 CLINICAL DATA:  70 year old female with acute left femur fracture. EXAM: PORTABLE CHEST 1 VIEW COMPARISON:  Portable chest 11/27/2020. FINDINGS: Portable AP supine view at 0517 hours. Mildly lower lung volumes. Mediastinal contours remain within normal limits. Mild additional crowding of lung markings but otherwise Allowing for portable technique the lungs are clear. No pneumothorax or pleural effusion identified on this supine view. Visible osseous structures appear intact. Paucity of bowel gas in the upper abdomen. IMPRESSION: Lower lung volumes.  No acute cardiopulmonary abnormality. Electronically Signed   By: Genevie Ann M.D.   On: 02/27/2021 05:26   DG Hip Unilat W or Wo Pelvis 2-3 Views Left  Result Date: 02/27/2021 CLINICAL DATA:  Fall at home with left hip pain EXAM: DG HIP (WITH OR WITHOUT PELVIS) 3V LEFT COMPARISON:  None. FINDINGS: Acute left femoral neck fracture with varus angulation due to displacement. Unremarkable acetabula. IMPRESSION: Displaced left femoral neck fracture. Electronically Signed   By: Jorje Guild M.D.   On: 02/27/2021 05:07    Review of Systems  HENT:  Negative for ear discharge, ear pain, hearing loss and tinnitus.   Eyes:  Negative for photophobia and pain.  Respiratory:  Negative for cough and shortness of breath.   Cardiovascular:  Negative for chest pain.  Gastrointestinal:  Negative for abdominal pain, nausea and vomiting.  Genitourinary:  Negative for  dysuria, flank pain, frequency and urgency.  Musculoskeletal:  Positive for arthralgias (Left hip). Negative for back pain, myalgias and neck pain.  Neurological:  Negative for dizziness and headaches.  Hematological:  Does not bruise/bleed easily.  Psychiatric/Behavioral:  The patient is not nervous/anxious.   Blood pressure 134/80, pulse (!) 101, temperature 99.2 F (37.3 C), temperature source Oral,  resp. rate 18, height 5\' 6"  (1.676 m), weight 61.2 kg, SpO2 98 %. Physical Exam Constitutional:      General: She is not in acute distress.    Appearance: She is well-developed. She is not diaphoretic.  HENT:     Head: Normocephalic and atraumatic.  Eyes:     General: No scleral icterus.       Right eye: No discharge.        Left eye: No discharge.     Conjunctiva/sclera: Conjunctivae normal.  Cardiovascular:     Rate and Rhythm: Normal rate and regular rhythm.  Pulmonary:     Effort: Pulmonary effort is normal. No respiratory distress.  Musculoskeletal:     Cervical back: Normal range of motion.     Comments: LLE No traumatic wounds, ecchymosis, or rash  Mild TTP  No knee or ankle effusion  Knee stable to varus/ valgus and anterior/posterior stress  Sens DPN, SPN, TN intact  Motor EHL, ext, flex, evers grossly intact  DP 1+, PT 2+, No significant edema  Skin:    General: Skin is warm and dry.  Neurological:     Mental Status: She is alert.  Psychiatric:        Mood and Affect: Mood normal.        Behavior: Behavior normal.    Assessment/Plan: Left hip fx -- Plan THA today by Dr. Lyla Glassing. Please keep NPO. Multiple medical problems including GBM and seizure disorder -- per primary service    Lisette Abu, PA-C Orthopedic Surgery [phone] 02/27/2021, 11:15 AM

## 2021-02-27 NOTE — H&P (Signed)
History and Physical    Darlene Marsh LKJ:[id number] DOB: 1951-04-22 DOA: 02/27/2021  PCP: Pcp, No  Patient coming from: Home  I have personally briefly reviewed patient's old medical records in [place]  Chief Complaint: Fall, hip pain  HPI: Darlene Marsh is a 70 y.o. 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  Now on Temodar consolidation chemo.  Also seizure disorder on keppra.  Pt presents to ED with c/o L hip pain following mechanical fall at home.  Pt attempted to sit on side of tub but slipped and fell on floor.  No LOC, hip pain is severe, nothing makes better, movement makes worse.  No fevers, chills.   ED Course: L femoral neck fx.   Review of Systems: As per HPI, otherwise all review of systems negative.  Past Medical History:  Diagnosis Date   Hyperlipidemia    Hypertension    Thyroid disease    underactive   Tremors of nervous system     Past Surgical History:  Procedure Laterality Date   APPLICATION OF CRANIAL NAVIGATION Right 09/05/2020   Procedure: APPLICATION OF CRANIAL NAVIGATION;  Surgeon: Vallarie Mare, MD;  Location: [facility];  Service: Neurosurgery;  Laterality: Right;   CRANIOTOMY Right 09/05/2020   Procedure: Craniotomy - right Frontal interhemispheric approach for tumor resection with BRAIN LAB;  Surgeon: Vallarie Mare, MD;  Location: [facility];  Service: Neurosurgery;  Laterality: Right;     reports that she has never smoked. She has never used smokeless tobacco. She reports that she does not drink alcohol and does not use drugs.  No Known Allergies  Family History  Problem Relation Age of Onset   Cancer Mother        Breast cancer   Cancer Sister        Breast   Cancer Sister        Breast     Prior to Admission medications   Medication Sig Start Date End Date Taking? Authorizing Provider  dexamethasone (DECADRON) 1 MG tablet Take 1 mg by mouth daily. 01/23/21  Yes  [provider]  furosemide (LASIX) 20 MG tablet Take 1 tablet (20 mg total) by mouth daily. 02/10/21  Yes Vaslow, Acey Lav, MD  levETIRAcetam (KEPPRA) 1000 MG tablet Take 1 tablet (1,000 mg total) by mouth 2 (two) times daily. 01/03/21 02/27/22 Yes Ghimire, Dante Gang, MD  ondansetron (ZOFRAN) 8 MG tablet Take 1 tablet (8 mg total) by mouth 2 (two) times daily as needed (nausea and vomiting). May take 30-60 minutes prior to Temodar administration if nausea/vomiting occurs. 12/23/20   Ventura Sellers, MD  temozolomide (TEMODAR) 100 MG capsule Take 2 capsules (200 mg total) by mouth daily. May take on an empty stomach to decrease nausea & vomiting. 01/27/21   Ventura Sellers, MD  temozolomide (TEMODAR) 20 MG capsule Take 1 capsule (20 mg total) by mouth daily. May take on an empty stomach to decrease nausea & vomiting. 01/27/21   Ventura Sellers, MD    Physical Exam: Vitals:   02/27/21 0338 02/27/21 0339 02/27/21 0400 02/27/21 0515  BP: 138/71  (!) 154/85 140/83  Pulse: 86  93 94  Resp: 16  18 18   Temp: 98.1 F (36.7 C)     TempSrc: Oral     SpO2: 97%  100% 100%  Weight:  61.2 kg    Height:  5\' 6"  (1.676 m)      Constitutional: NAD,  calm, comfortable Eyes: PERRL, lids and conjunctivae normal ENMT: Mucous membranes are moist. Posterior pharynx clear of any exudate or lesions.Normal dentition.  Neck: normal, supple, no masses, no thyromegaly Respiratory: clear to auscultation bilaterally, no wheezing, no crackles. Normal respiratory effort. No accessory muscle use.  Cardiovascular: Regular rate and rhythm, no murmurs / rubs / gallops. No extremity edema. 2+ pedal pulses. No carotid bruits.  Abdomen: no tenderness, no masses palpated. No hepatosplenomegaly. Bowel sounds positive.  Musculoskeletal: L hip TTP Skin: no rashes, lesions, ulcers. No induration Neurologic: CN 2-12 grossly intact. Sensation intact, DTR normal. Strength 5/5 in all 4.  Psychiatric: Normal judgment and insight.  Alert and oriented x 3. Normal mood.    Labs on Admission: I have personally reviewed following labs and imaging studies  CBC: Recent Labs  Lab 02/27/21 0411  WBC 13.8*  NEUTROABS 10.8*  HGB 11.5*  HCT 34.0*  MCV 100.0  PLT 202   Basic Metabolic Panel: Recent Labs  Lab 02/27/21 0411  NA 144  K 3.2*  CL 106  CO2 30  GLUCOSE 108*  BUN 9  CREATININE 0.56  CALCIUM 8.7*   GFR: Estimated Creatinine Clearance: 59.5 mL/min (by C-G formula based on SCr of 0.56 mg/dL). Liver Function Tests: Recent Labs  Lab 02/27/21 0411  AST 16  ALT 18  ALKPHOS 83  BILITOT 0.9  PROT 6.3*  ALBUMIN 3.5   No results for input(s): LIPASE, AMYLASE in the last 168 hours. No results for input(s): AMMONIA in the last 168 hours. Coagulation Profile: No results for input(s): INR, PROTIME in the last 168 hours. Cardiac Enzymes: No results for input(s): CKTOTAL, CKMB, CKMBINDEX, TROPONINI in the last 168 hours. BNP (last 3 results) No results for input(s): PROBNP in the last 8760 hours. HbA1C: No results for input(s): HGBA1C in the last 72 hours. CBG: No results for input(s): GLUCAP in the last 168 hours. Lipid Profile: No results for input(s): CHOL, HDL, LDLCALC, TRIG, CHOLHDL, LDLDIRECT in the last 72 hours. Thyroid Function Tests: No results for input(s): TSH, T4TOTAL, FREET4, T3FREE, THYROIDAB in the last 72 hours. Anemia Panel: No results for input(s): VITAMINB12, FOLATE, FERRITIN, TIBC, IRON, RETICCTPCT in the last 72 hours. Urine analysis:    Component Value Date/Time   COLORURINE STRAW (A) 01/02/2021 1510   APPEARANCEUR CLEAR 01/02/2021 1510   LABSPEC 1.005 01/02/2021 1510   PHURINE 8.0 01/02/2021 1510   GLUCOSEU NEGATIVE 01/02/2021 1510   HGBUR NEGATIVE 01/02/2021 1510   BILIRUBINUR NEGATIVE 01/02/2021 1510   KETONESUR NEGATIVE 01/02/2021 1510   PROTEINUR NEGATIVE 01/02/2021 1510   NITRITE NEGATIVE 01/02/2021 1510   LEUKOCYTESUR NEGATIVE 01/02/2021 1510    Radiological  Exams on Admission: DG Chest Portable 1 View  Result Date: 02/27/2021 CLINICAL DATA:  70 year old female with acute left femur fracture. EXAM: PORTABLE CHEST 1 VIEW COMPARISON:  Portable chest 11/27/2020. FINDINGS: Portable AP supine view at 0517 hours. Mildly lower lung volumes. Mediastinal contours remain within normal limits. Mild additional crowding of lung markings but otherwise Allowing for portable technique the lungs are clear. No pneumothorax or pleural effusion identified on this supine view. Visible osseous structures appear intact. Paucity of bowel gas in the upper abdomen. IMPRESSION: Lower lung volumes.  No acute cardiopulmonary abnormality. Electronically Signed   By: Genevie Ann M.D.   On: 02/27/2021 05:26   DG Hip Unilat W or Wo Pelvis 2-3 Views Left  Result Date: 02/27/2021 CLINICAL DATA:  Fall at home with left hip pain EXAM: DG HIP (WITH OR  WITHOUT PELVIS) 3V LEFT COMPARISON:  None. FINDINGS: Acute left femoral neck fracture with varus angulation due to displacement. Unremarkable acetabula. IMPRESSION: Displaced left femoral neck fracture. Electronically Signed   By: Jorje Guild M.D.   On: 02/27/2021 05:07    EKG: Pending  Assessment/Plan Principal Problem:   Closed displaced fracture of left femoral neck (HCC) Active Problems:   Hypothyroidism   Essential hypertension   Glioblastoma multiforme of frontal lobe (HCC)   Seizure disorder (HCC)    L femoral neck fx - Hip fx pathway Dilaudid IV PRN pain NPO CXR neg Getting EKG GUPTA score is 0.7% Should be noted though that pt will have elevated 1 yr risk of mortality simply from the GBM (independent of femoral neck fx). GBM - Not progressive at the moment but does have ring enhancing lesion on MRIs suspicious of possible recurrence. On temodar consolidation therapy, next doses would be due 10/1-10/5 Consult to onc put into Epic, will let them decide if pt should continue this in post op period. Continue decadron  (currently 1mg  daily) Seizure disorder - Cont Keppra Hypothyroidism and HTN - Chart diagnoses, doesn't appear to be on synthroid or BP meds Checking TSH  DVT prophylaxis: SCDs Code Status: Full Family Communication: Husband at bedside Disposition Plan: SNF after hip fx repair Consults called: Ortho, also put in consult to Epic for oncology Admission status: Admit to inpatient  Severity of Illness: The appropriate patient status for this patient is INPATIENT. Inpatient status is judged to be reasonable and necessary in order to provide the required intensity of service to ensure the patient's safety. The patient's presenting symptoms, physical exam findings, and initial radiographic and laboratory data in the context of their chronic comorbidities is felt to place them at high risk for further clinical deterioration. Furthermore, it is not anticipated that the patient will be medically stable for discharge from the hospital within 2 midnights of admission. The following factors support the patient status of inpatient.   IP status for OR repair of hip fx.  * I certify that at the point of admission it is my clinical judgment that the patient will require inpatient hospital care spanning beyond 2 midnights from the point of admission due to high intensity of service, high risk for further deterioration and high frequency of surveillance required.*   Timarion Agcaoili M. DO [facility]  How to contact the [facility] Attending or Consulting provider [place] or covering provider during after hours [place], for this patient?  Check the care team in [facility] and look for a) attending/consulting [facility] provider listed and b) the [facility] team listed Log into [email or web address]  Amion Physician Scheduling and messaging for groups and whole hospitals  On call and physician scheduling software for group practices, residents, hospitalists and other medical providers for call, clinic, rotation and shift schedules. OnCall  [employer] is a hospital-wide system for scheduling doctors and paging doctors on call. EasyPlot is for scientific plotting and data analysis.  [email or web address]  and use [facility]'s universal password to access. If you do not have the password, please contact the hospital operator.  Locate the [facility] provider you are looking for under [facility] and page to a number that you can be directly reached. If you still have difficulty reaching the provider, please page the [facility] (Director on Call) for the Hospitalists listed on amion for assistance.  02/27/2021, 5:51 AM

## 2021-02-27 NOTE — Consult Note (Addendum)
Reason for Consult:Left hip fx Referring Physician: Marzetta Board Time called: [id number] Time at bedside: Darlene Marsh is an 70 y.o. female.  HPI: Darlene Marsh was in the bathroom last night and fell. She doesn't really remember the circumstances of the fall. She had immediate left hip pain and could not get up. She was brought to the ED where x-rays showed a left femoral neck fx and orthopedic surgery was consulted. She lives at home with her husband and usually ambulates with a RW.  Past Medical History:  Diagnosis Date   Hyperlipidemia    Hypertension    Thyroid disease    underactive   Tremors of nervous system     Past Surgical History:  Procedure Laterality Date   APPLICATION OF CRANIAL NAVIGATION Right 09/05/2020   Procedure: APPLICATION OF CRANIAL NAVIGATION;  Surgeon: Vallarie Mare, MD;  Location: [place];  Service: Neurosurgery;  Laterality: Right;   CRANIOTOMY Right 09/05/2020   Procedure: Craniotomy - right Frontal interhemispheric approach for tumor resection with BRAIN LAB;  Surgeon: Vallarie Mare, MD;  Location: [place];  Service: Neurosurgery;  Laterality: Right;    Family History  Problem Relation Age of Onset   Cancer Mother        Breast cancer   Cancer Sister        Breast   Cancer Sister        Breast    Social History:  reports that she has never smoked. She has never used smokeless tobacco. She reports that she does not drink alcohol and does not use drugs.  Allergies: No Known Allergies  Medications: I have reviewed the patient's current medications.  Results for orders placed or performed during the hospital encounter of 02/27/21 (from the past 48 hour(s))  CBC with Differential     Status: Abnormal   Collection Time: 02/27/21  4:11 AM  Result Value Ref Range   WBC 13.8 (H) 4.0 - 10.5 K/uL   RBC 3.40 (L) 3.87 - 5.11 MIL/uL   Hemoglobin 11.5 (L) 12.0 - 15.0 g/dL   HCT 34.0 (L) 36.0 - 46.0 %   MCV 100.0 80.0 - 100.0 fL   MCH 33.8 26.0 - 34.0 pg    MCHC 33.8 30.0 - 36.0 g/dL   RDW 13.6 11.5 - 15.5 %   Platelets 307 150 - 400 K/uL   nRBC 0.0 0.0 - 0.2 %   Neutrophils Relative % 77 %   Neutro Abs 10.8 (H) 1.7 - 7.7 K/uL   Lymphocytes Relative 13 %   Lymphs Abs 1.8 0.7 - 4.0 K/uL   Monocytes Relative 6 %   Monocytes Absolute 0.8 0.1 - 1.0 K/uL   Eosinophils Relative 0 %   Eosinophils Absolute 0.0 0.0 - 0.5 K/uL   Basophils Relative 1 %   Basophils Absolute 0.1 0.0 - 0.1 K/uL   Immature Granulocytes 3 %   Abs Immature Granulocytes 0.34 (H) 0.00 - 0.07 K/uL    Comment: Performed at [facility], [place] [address], [place], [place] [address]  Comprehensive metabolic panel     Status: Abnormal   Collection Time: 02/27/21  4:11 AM  Result Value Ref Range   Sodium 144 135 - 145 mmol/L   Potassium 3.2 (L) 3.5 - 5.1 mmol/L   Chloride 106 98 - 111 mmol/L   CO2 30 22 - 32 mmol/L   Glucose, Bld 108 (H) 70 - 99 mg/dL    Comment: Glucose reference range applies only  to samples taken after fasting for at least 8 hours.   BUN 9 8 - 23 mg/dL   Creatinine, Ser 0.56 0.44 - 1.00 mg/dL   Calcium 8.7 (L) 8.9 - 10.3 mg/dL   Total Protein 6.3 (L) 6.5 - 8.1 g/dL   Albumin 3.5 3.5 - 5.0 g/dL   AST 16 15 - 41 U/L   ALT 18 0 - 44 U/L   Alkaline Phosphatase 83 38 - 126 U/L   Total Bilirubin 0.9 0.3 - 1.2 mg/dL   GFR, Estimated >60 >60 mL/min    Comment: (NOTE) Calculated using the CKD-EPI Creatinine Equation (2021)    Anion gap 8 5 - 15    Comment: Performed at [facility], [place] [address] [place], [place] [address]  TSH     Status: None   Collection Time: 02/27/21  6:10 AM  Result Value Ref Range   TSH 2.325 0.350 - 4.500 uIU/mL    Comment: Performed by a 3rd Generation assay with a functional sensitivity of <=0.01 uIU/mL. Performed at [facility], [place] [address], [place], [place] [address]   Surgical pcr screen     Status: None   Collection Time: 02/27/21  9:05 AM   Specimen: Nasal  Mucosa; Nasal Swab  Result Value Ref Range   MRSA, PCR NEGATIVE NEGATIVE   Staphylococcus aureus NEGATIVE NEGATIVE    Comment: (NOTE) The Xpert SA Assay (FDA approved for NASAL specimens in patients 19 years of age and older), is one component of a comprehensive surveillance program. It is not intended to diagnose infection nor to guide or monitor treatment. Performed at [facility], [place] [address], [place],  [address]     DG Chest Portable 1 View  Result Date: 02/27/2021 CLINICAL DATA:  70 year Darlene female with acute left femur fracture. EXAM: PORTABLE CHEST 1 VIEW COMPARISON:  Portable chest 11/27/2020. FINDINGS: Portable AP supine view at 0517 hours. Mildly lower lung volumes. Mediastinal contours remain within normal limits. Mild additional crowding of lung markings but otherwise Allowing for portable technique the lungs are clear. No pneumothorax or pleural effusion identified on this supine view. Visible osseous structures appear intact. Paucity of bowel gas in the upper abdomen. IMPRESSION: Lower lung volumes.  No acute cardiopulmonary abnormality. Electronically Signed   By: Genevie Ann M.D.   On: 02/27/2021 05:26   DG Hip Unilat W or Wo Pelvis 2-3 Views Left  Result Date: 02/27/2021 CLINICAL DATA:  Fall at home with left hip pain EXAM: DG HIP (WITH OR WITHOUT PELVIS) 3V LEFT COMPARISON:  None. FINDINGS: Acute left femoral neck fracture with varus angulation due to displacement. Unremarkable acetabula. IMPRESSION: Displaced left femoral neck fracture. Electronically Signed   By: Jorje Guild M.D.   On: 02/27/2021 05:07    Review of Systems  HENT:  Negative for ear discharge, ear pain, hearing loss and tinnitus.   Eyes:  Negative for photophobia and pain.  Respiratory:  Negative for cough and shortness of breath.   Cardiovascular:  Negative for chest pain.  Gastrointestinal:  Negative for abdominal pain, nausea and vomiting.  Genitourinary:  Negative for  dysuria, flank pain, frequency and urgency.  Musculoskeletal:  Positive for arthralgias (Left hip). Negative for back pain, myalgias and neck pain.  Neurological:  Negative for dizziness and headaches.  Hematological:  Does not bruise/bleed easily.  Psychiatric/Behavioral:  The patient is not nervous/anxious.   Blood pressure 134/80, pulse (!) 101, temperature 99.2 F (37.3 C), temperature source Oral,  resp. rate 18, height 5\' 6"  (1.676 m), weight 61.2 kg, SpO2 98 %. Physical Exam Constitutional:      General: She is not in acute distress.    Appearance: She is well-developed. She is not diaphoretic.  HENT:     Head: Normocephalic and atraumatic.  Eyes:     General: No scleral icterus.       Right eye: No discharge.        Left eye: No discharge.     Conjunctiva/sclera: Conjunctivae normal.  Cardiovascular:     Rate and Rhythm: Normal rate and regular rhythm.  Pulmonary:     Effort: Pulmonary effort is normal. No respiratory distress.  Musculoskeletal:     Cervical back: Normal range of motion.     Comments: LLE No traumatic wounds, ecchymosis, or rash  Mild TTP  No knee or ankle effusion  Knee stable to varus/ valgus and anterior/posterior stress  Sens DPN, SPN, TN intact  Motor EHL, ext, flex, evers grossly intact  DP 1+, PT 2+, No significant edema  Skin:    General: Skin is warm and dry.  Neurological:     Mental Status: She is alert.  Psychiatric:        Mood and Affect: Mood normal.        Behavior: Behavior normal.    Assessment/Plan: Left hip fx -- Plan THA today by Dr. Lyla Glassing. Please keep NPO. Multiple medical problems including GBM and seizure disorder -- per primary service    Lisette Abu, PA-C Orthopedic Surgery [phone] 02/27/2021, 11:15 AM

## 2021-02-27 NOTE — Op Note (Signed)
OPERATIVE REPORT  SURGEON: Rod Can, MD   ASSISTANT: Costella Hatcher, PA-C  PREOPERATIVE DIAGNOSIS: Displaced Left femoral neck fracture.   POSTOPERATIVE DIAGNOSIS: Displaced Left femoral neck fracture.   PROCEDURE: Left total hip arthroplasty, anterior approach.   IMPLANTS: DePuy Tri Lock stem, size 5, hi offset. DePuy Pinnacle Cup, size 50 mm. DePuy Altrx liner, size 32 by 50 mm, neutral. DePuy Biolox ceramic head ball, size 32 + 1 mm.  ANESTHESIA:  General  ANTIBIOTICS: 2g ancef.  ESTIMATED BLOOD LOSS:-250 mL    DRAINS: None.  COMPLICATIONS: None   CONDITION: PACU - hemodynamically stable.   BRIEF CLINICAL NOTE: Darlene Marsh is a 70 y.o. female with a displaced Left femoral neck fracture. The patient was admitted to the hospitalist service and underwent perioperative risk stratification and medical optimization. The risks, benefits, and alternatives to total hip arthroplasty were explained, and the patient elected to proceed.  PROCEDURE IN DETAIL: The patient was taken to the operating room and general anesthesia was induced on the hospital bed.  The patient was then positioned on the Hana table.  All bony prominences were well padded.  The hip was prepped and draped in the normal sterile surgical fashion.  A time-out was called verifying side and site of surgery. Antibiotics were given within 60 minutes of beginning the procedure.   Bikini incision was made, and the direct anterior approach to the hip was performed through the Hueter interval.  Lateral femoral circumflex vessels were treated with the Auqumantys. The anterior capsule was exposed and an inverted T capsulotomy was made.  Fracture hematoma was encountered and evacuated. The patient was found to have a comminuted Left subcapital femoral neck fracture.  I freshened the femoral neck cut with a saw.  I removed the femoral neck fragment.  A corkscrew was placed into the head and the head was removed.  This was  passed to the back table and was measured. The pubofemoral ligament was released subperiosteally to the lesser trochanter.  Acetabular exposure was achieved, and the pulvinar and labrum were excised. Sequential reaming of the acetabulum was then performed up to a size 49 mm reamer under direct visulization. A 50 mm cup was then opened and impacted into place at approximately 40 degrees of abduction and 20 degrees of anteversion. The final polyethylene liner was impacted into place and acetabular osteophytes were removed.    I then gained femoral exposure taking care to protect the abductors and greater trochanter.  This was performed using standard external rotation, extension, and adduction.  A cookie cutter was used to enter the femoral canal, and then the femoral canal finder was placed.  Sequential broaching was performed up to a size 5.  Calcar planer was used on the femoral neck remnant.  I placed a hi offset neck and a trial head ball.  The hip was reduced.  Leg lengths and offset were checked fluoroscopically.  The hip was dislocated and trial components were removed.  The final implants were placed, and the hip was reduced.  Fluoroscopy was used to confirm component position and leg lengths.  At 90 degrees of external rotation and full extension, the hip was stable to an anterior directed force.   The wound was copiously irrigated with Irrisept solution and normal saline using pule lavage.  Marcaine solution was injected into the periarticular soft tissue.  The wound was closed in layers using #1 Stratafix for the fascia, 2-0 Vicryl for the subcutaneous fat, 2-0 Monocryl for the deep dermal  layer, and staples + Dermabond for the skin.  Once the glue was fully dried, an Aquacell Ag dressing was applied.  The patient was transported to the recovery room in stable condition.  Sponge, needle, and instrument counts were correct at the end of the case x2.  The patient tolerated the procedure well and there  were no known complications.  Please note that a surgical assistant was a medical necessity for this procedure to perform it in a safe and expeditious manner. Assistant was necessary to provide appropriate retraction of vital neurovascular structures, to prevent femoral fracture, and to allow for anatomic placement of the prosthesis.

## 2021-02-27 NOTE — Transfer of Care (Signed)
Immediate Anesthesia Transfer of Care Note  Patient: Darlene Marsh  Procedure(s) Performed: TOTAL HIP ARTHROPLASTY ANTERIOR APPROACH (Left: Hip)  Patient Location: PACU  Anesthesia Type:General  Level of Consciousness: drowsy and patient cooperative  Airway & Oxygen Therapy: Patient Spontanous Breathing and Patient connected to face mask oxygen  Post-op Assessment: Report given to RN and Post -op Vital signs reviewed and stable  Post vital signs: Reviewed and stable  Last Vitals:  Vitals Value Taken Time  BP 136/70 02/27/21 1910  Temp    Pulse    Resp 12 02/27/21 1912  SpO2    Vitals shown include unvalidated device data.  Last Pain:  Vitals:   02/27/21 1318  TempSrc:   PainSc: Asleep      Patients Stated Pain Goal: 0 (65/46/50 3546)  Complications: No notable events documented.

## 2021-02-27 NOTE — Plan of Care (Signed)
POC initiated 

## 2021-02-27 NOTE — Consult Note (Signed)
ORTHOPAEDIC CONSULTATION  REQUESTING PHYSICIAN: Caren Griffins, MD  PCP:  Pcp, No  Chief Complaint: Left hip pain  HPI: Darlene Marsh is a 70 y.o. female who complains of left hip pain secondary to a fall. Past medical history is significant for GBM diagnosed earlier this year, s/p craniotomy and radiation. Now on Temodar consolidation chemo. Patient also has seizure disorder on keppra.   The patient was attempting to sit on the side of a tub but slipped and fell on the floor. The patient was transported to the ED for evaluation and was diagnosed with a left hip fracture. Patient was admitted by the hospitalist service. Orthopedics was consulted for further care and management of the hip fracture  Past Medical History:  Diagnosis Date   Hyperlipidemia    Hypertension    Thyroid disease    underactive   Tremors of nervous system    Past Surgical History:  Procedure Laterality Date   APPLICATION OF CRANIAL NAVIGATION Right 09/05/2020   Procedure: APPLICATION OF CRANIAL NAVIGATION;  Surgeon: Vallarie Mare, MD;  Location: [facility];  Service: Neurosurgery;  Laterality: Right;   CRANIOTOMY Right 09/05/2020   Procedure: Craniotomy - right Frontal interhemispheric approach for tumor resection with BRAIN LAB;  Surgeon: Vallarie Mare, MD;  Location: [facility];  Service: Neurosurgery;  Laterality: Right;   Social History   Socioeconomic History   Marital status: Married    Spouse name: Not on file   Number of children: Not on file   Years of education: Not on file   Highest education level: Not on file  Occupational History   Not on file  Tobacco Use   Smoking status: Never   Smokeless tobacco: Never  Vaping Use   Vaping Use: Never used  Substance and Sexual Activity   Alcohol use: Never   Drug use: Never   Sexual activity: Not on file  Other Topics Concern   Not on file  Social History Narrative   Not on file   Social Determinants of Health   Financial Resource  Strain: Not on file  Food Insecurity: Not on file  Transportation Needs: Not on file  Physical Activity: Not on file  Stress: Not on file  Social Connections: Not on file   Family History  Problem Relation Age of Onset   Cancer Mother        Breast cancer   Cancer Sister        Breast   Cancer Sister        Breast   No Known Allergies Prior to Admission medications   Medication Sig Start Date End Date Taking? Authorizing Provider  dexamethasone (DECADRON) 1 MG tablet Take 1 mg by mouth daily. 01/23/21  Yes [provider]  furosemide (LASIX) 20 MG tablet Take 1 tablet (20 mg total) by mouth daily. 02/10/21  Yes Vaslow, Acey Lav, MD  levETIRAcetam (KEPPRA) 1000 MG tablet Take 1 tablet (1,000 mg total) by mouth 2 (two) times daily. 01/03/21 02/27/22 Yes Ghimire, Dante Gang, MD  ondansetron (ZOFRAN) 8 MG tablet Take 1 tablet (8 mg total) by mouth 2 (two) times daily as needed (nausea and vomiting). May take 30-60 minutes prior to Temodar administration if nausea/vomiting occurs. 12/23/20   Ventura Sellers, MD  temozolomide (TEMODAR) 100 MG capsule Take 2 capsules (200 mg total) by mouth daily. May take on an empty stomach to decrease nausea & vomiting. 01/27/21   Ventura Sellers, MD  temozolomide (TEMODAR) 20 MG  capsule Take 1 capsule (20 mg total) by mouth daily. May take on an empty stomach to decrease nausea & vomiting. 01/27/21   Ventura Sellers, MD   DG Chest Portable 1 View  Result Date: 02/27/2021 CLINICAL DATA:  70 year old female with acute left femur fracture. EXAM: PORTABLE CHEST 1 VIEW COMPARISON:  Portable chest 11/27/2020. FINDINGS: Portable AP supine view at 0517 hours. Mildly lower lung volumes. Mediastinal contours remain within normal limits. Mild additional crowding of lung markings but otherwise Allowing for portable technique the lungs are clear. No pneumothorax or pleural effusion identified on this supine view. Visible osseous structures appear intact. Paucity of  bowel gas in the upper abdomen. IMPRESSION: Lower lung volumes.  No acute cardiopulmonary abnormality. Electronically Signed   By: Genevie Ann M.D.   On: 02/27/2021 05:26   DG Hip Unilat W or Wo Pelvis 2-3 Views Left  Result Date: 02/27/2021 CLINICAL DATA:  Fall at home with left hip pain EXAM: DG HIP (WITH OR WITHOUT PELVIS) 3V LEFT COMPARISON:  None. FINDINGS: Acute left femoral neck fracture with varus angulation due to displacement. Unremarkable acetabula. IMPRESSION: Displaced left femoral neck fracture. Electronically Signed   By: Jorje Guild M.D.   On: 02/27/2021 05:07    Positive ROS: All other systems have been reviewed and were otherwise negative with the exception of those mentioned in the HPI and as above.  Physical Exam: General: Alert, no acute distress Cardiovascular: No pedal edema Respiratory: No cyanosis, no use of accessory musculature GI: No organomegaly, abdomen is soft and non-tender Skin: No lesions in the area of chief complaint Neurologic: Sensation intact distally Psychiatric: Patient is competent for consent with normal mood and affect Lymphatic: No axillary or cervical lymphadenopathy  MUSCULOSKELETAL: Left hip pain. LLE shortened and externally rotated. Patient unable to dorsiflex or plantar flex on left side. Patient unable to move toes on left side. Patient unable to perform quad set on left side. Motor function normal on the right side  Assessment: Displaced left femoral neck fracture  Plan: Left hip fracture: I discussed the case with the patient and her husband.  The fix for her hip fracture is hip arthroplasty. I discussed the risks and benefits of the surgery with the patient and her husband. They would like to proceed with surgery and consented to either left total hip arthroplasty or left hip hemiarthroplasty. The patient's weak LLE could make recovery more difficult, but to not repair the fracture would leave the patient bedridden. Hold chemical DVT  prophylaxis. Keep NPO. Plan on surgery this afternoon with [place] [phone]    02/27/2021 10:13 AM

## 2021-02-27 NOTE — Progress Notes (Signed)
PROGRESS NOTE  Darlene Marsh MLY:[id number] DOB: 1951/05/03 DOA: 02/27/2021 PCP: Pcp, No   LOS: 0 days   Brief Narrative / Interim history: Darlene Marsh is a 70 y.o. 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  Now on Temodar consolidation chemo.  Also seizure disorder on keppra. Pt presents to ED with c/o L hip pain following mechanical fall at home.  She was found to have a left femoral neck fracture, orthopedic surgery consulted  Subjective / 24h Interval events: Complains of hip pain with movement.  Denies any loss of consciousness, seizures at home and this was a mechanical fall  Assessment & Plan: Principal Problem Left femoral neck fracture-orthopedic surgery consulted, plan for operative repair.  PT, OT, DVT prophylaxis per Ortho.  Active Problems GBM, seizure disorder-follows with Dr. Mickeal Skinner, continue Decadron, Keppra  Hypothyroidism-does not appear to be on thyroid meds, TSH normal.  Outpatient follow-up  Scheduled Meds:  chlorhexidine  60 mL Topical Once   dexamethasone  1 mg Oral Daily   levETIRAcetam  1,000 mg Oral BID   povidone-iodine  2 application Topical Once   povidone-iodine  2 application Topical Once   Continuous Infusions:   ceFAZolin (ANCEF) IV     tranexamic acid     PRN Meds:.HYDROmorphone (DILAUDID) injection  Diet Orders (From admission, onward)     Start     Ordered   02/27/21 0926  Diet NPO time specified Except for: Sips with Meds  Diet effective now       Question:  Except for  Answer:  Ferrel Logan with Meds   02/27/21 0925            DVT prophylaxis: SCDs Start: 02/27/21 0544     Code Status: Full Code  Family Communication: Husband present at the bedside  Status is: Inpatient  Remains inpatient appropriate because:Inpatient level of care appropriate due to severity of illness  Dispo: The patient is from: Home              Anticipated d/c is to: SNF              Patient  currently is not medically stable to d/c.   Difficult to place patient No  Level of care: Med-Surg  Consultants:  Orthopedic surgery   Procedures:  none  Microbiology  none  Antimicrobials: none    Objective: Vitals:   02/27/21 0339 02/27/21 0400 02/27/21 0515 02/27/21 0651  BP:  (!) 154/85 140/83 130/72  Pulse:  93 94 (!) 102  Resp:  18 18 16   Temp:    98.7 F (37.1 C)  TempSrc:    Oral  SpO2:  100% 100% 100%  Weight: 61.2 kg     Height: 5\' 6"  (1.676 m)      No intake or output data in the 24 hours ending 02/27/21 1024 Filed Weights   02/27/21 0339  Weight: 61.2 kg    Examination:  Constitutional: NAD Respiratory: clear to auscultation bilaterally, no wheezing Cardiovascular: Regular rate and rhythm, no murmurs / rubs / gallops. No LE edema.   Data Reviewed: I have independently reviewed following labs and imaging studies   CBC: Recent Labs  Lab 02/27/21 0411  WBC 13.8*  NEUTROABS 10.8*  HGB 11.5*  HCT 34.0*  MCV 100.0  PLT 812   Basic Metabolic Panel: Recent Labs  Lab 02/27/21 0411  NA 144  K 3.2*  CL 106  CO2 30  GLUCOSE 108*  BUN  9  CREATININE 0.56  CALCIUM 8.7*   Liver Function Tests: Recent Labs  Lab 02/27/21 0411  AST 16  ALT 18  ALKPHOS 83  BILITOT 0.9  PROT 6.3*  ALBUMIN 3.5   Coagulation Profile: No results for input(s): INR, PROTIME in the last 168 hours. HbA1C: No results for input(s): HGBA1C in the last 72 hours. CBG: No results for input(s): GLUCAP in the last 168 hours.  No results found for this or any previous visit (from the past 240 hour(s)).   Radiology Studies: DG Chest Portable 1 View  Result Date: 02/27/2021 CLINICAL DATA:  70 year old female with acute left femur fracture. EXAM: PORTABLE CHEST 1 VIEW COMPARISON:  Portable chest 11/27/2020. FINDINGS: Portable AP supine view at 0517 hours. Mildly lower lung volumes. Mediastinal contours remain within normal limits. Mild additional crowding of lung  markings but otherwise Allowing for portable technique the lungs are clear. No pneumothorax or pleural effusion identified on this supine view. Visible osseous structures appear intact. Paucity of bowel gas in the upper abdomen. IMPRESSION: Lower lung volumes.  No acute cardiopulmonary abnormality. Electronically Signed   By: Genevie Ann M.D.   On: 02/27/2021 05:26   DG Hip Unilat W or Wo Pelvis 2-3 Views Left  Result Date: 02/27/2021 CLINICAL DATA:  Fall at home with left hip pain EXAM: DG HIP (WITH OR WITHOUT PELVIS) 3V LEFT COMPARISON:  None. FINDINGS: Acute left femoral neck fracture with varus angulation due to displacement. Unremarkable acetabula. IMPRESSION: Displaced left femoral neck fracture. Electronically Signed   By: Jorje Guild M.D.   On: 02/27/2021 05:07     Marzetta Board, MD, PhD [facility]  Between 7 am - 7 pm I am available, please contact me via Amion (for emergencies) or Securechat (non urgent messages)  Between 7 pm - 7 am I am not available, please contact night coverage MD/APP via Amion

## 2021-02-28 ENCOUNTER — Other Ambulatory Visit: Payer: Medicare Other

## 2021-02-28 LAB — COMPREHENSIVE METABOLIC PANEL
ALT: 22 U/L (ref 0–44)
AST: 19 U/L (ref 15–41)
Albumin: 2.8 g/dL — ABNORMAL LOW (ref 3.5–5.0)
Alkaline Phosphatase: 67 U/L (ref 38–126)
Anion gap: 7 (ref 5–15)
BUN: 10 mg/dL (ref 8–23)
CO2: 26 mmol/L (ref 22–32)
Calcium: 8.5 mg/dL — ABNORMAL LOW (ref 8.9–10.3)
Chloride: 105 mmol/L (ref 98–111)
Creatinine, Ser: 0.53 mg/dL (ref 0.44–1.00)
GFR, Estimated: >60 mL/min (ref >60)
Glucose, Bld: 144 mg/dL — ABNORMAL HIGH (ref 70–99)
Potassium: 4.3 mmol/L (ref 3.5–5.1)
Sodium: 138 mmol/L (ref 135–145)
Total Bilirubin: 0.8 mg/dL (ref 0.3–1.2)
Total Protein: 5.6 g/dL — ABNORMAL LOW (ref 6.5–8.1)

## 2021-02-28 LAB — CBC
HCT: 31.1 % — ABNORMAL LOW (ref 36.0–46.0)
Hemoglobin: 10.6 g/dL — ABNORMAL LOW (ref 12.0–15.0)
MCH: 33.5 pg (ref 26.0–34.0)
MCHC: 34.1 g/dL (ref 30.0–36.0)
MCV: 98.4 fL (ref 80.0–100.0)
Platelets: 294 10*3/uL (ref 150–400)
RBC: 3.16 MIL/uL — ABNORMAL LOW (ref 3.87–5.11)
RDW: 13.5 % (ref 11.5–15.5)
WBC: 17.4 10*3/uL — ABNORMAL HIGH (ref 4.0–10.5)
nRBC: 0 % (ref 0.0–0.2)

## 2021-02-28 MED ORDER — HYDROCODONE-ACETAMINOPHEN 5-325 MG PO TABS: 1.0000 | ORAL_TABLET | ORAL | 0 refills | Status: DC | PRN

## 2021-02-28 MED ORDER — HYDROXYZINE HCL 10 MG PO TABS
10.0000 mg | ORAL_TABLET | Freq: Three times a day (TID) | ORAL | Status: DC | PRN
  Administered 2021-03-02 – 2021-03-03 (×2): 10 mg via ORAL
  Filled 2021-02-28 (×4): qty 1

## 2021-02-28 MED ORDER — ASPIRIN 81 MG PO CHEW: 81.0000 mg | CHEWABLE_TABLET | Freq: Two times a day (BID) | ORAL | 0 refills | Status: AC

## 2021-02-28 NOTE — Progress Notes (Signed)
    Subjective:  Patient reports pain as mild to moderate.  Denies N/V/CP/SOB.   Objective:   VITALS:   Vitals:   02/27/21 2234 02/28/21 0138 02/28/21 0559 02/28/21 0931  BP: 116/79 114/72 112/64 118/72  Pulse: 88 83 92 94  Resp: 15 15 15 18   Temp: 97.8 F (36.6 C) 97.9 F (36.6 C) 98.4 F (36.9 C) 98.9 F (37.2 C)  TempSrc: Oral Oral Oral Oral  SpO2: 100% 100% 100% 98%  Weight:      Height:        NAD ABD soft Sensation intact distally Intact pulses distally Dorsiflexion/Plantar flexion intact Incision: dressing C/D/I Compartment soft   Lab Results  Component Value Date   WBC 17.4 (H) 02/28/2021   HGB 10.6 (L) 02/28/2021   HCT 31.1 (L) 02/28/2021   MCV 98.4 02/28/2021   PLT 294 02/28/2021   BMET    Component Value Date/Time   NA 138 02/28/2021 0307   K 4.3 02/28/2021 0307   CL 105 02/28/2021 0307   CO2 26 02/28/2021 0307   GLUCOSE 144 (H) 02/28/2021 0307   BUN 10 02/28/2021 0307   CREATININE 0.53 02/28/2021 0307   CREATININE 0.62 01/27/2021 0956   CREATININE 0.74 03/15/2019 0817   CALCIUM 8.5 (L) 02/28/2021 0307   GFRNONAA >60 02/28/2021 0307   GFRNONAA >60 01/27/2021 0956   GFRNONAA 75 01/19/2018 0939     Assessment/Plan: 1 Day Post-Op   Principal Problem:   Closed displaced fracture of left femoral neck (HCC) Active Problems:   Hypothyroidism   Essential hypertension   Glioblastoma multiforme of frontal lobe (HCC)   Seizure disorder (HCC)   WBAT with walker DVT ppx: Aspirin, SCDs, TEDS PO pain control PT/OT Dispo: D/C planning, rx on chart  Bertram Savin 02/28/2021, 3:53 PM   Rod Can, MD [phone] [facility]  [facility] [address], [place], [place] [address] Phone: [phone] [email or web address] Facebook  [employer]

## 2021-02-28 NOTE — Evaluation (Signed)
Occupational Therapy Evaluation Patient Details Name: Darlene Marsh MRN: [id number] DOB: 10-03-1950 Today's Date: 02/28/2021   History of Present Illness Patient is a 69 year old female with fall at home resulting in L hip fracture. S/p L THA, anterior approach on 9/29. PMH includes GBM diagnosed earlier this year, s/p craniotomy and radiation.  Now on Temodar consolidation chemo, seizure disorder on keppra.   Clinical Impression   Patient lives at home with spouse and is typically independent with self care tasks, does not use adaptive device for ambulation. Currently patient presents with cognitive deficits (unsure if changed from baseline/dx of GBM) needing max multimodal cues for task initiation, sequencing and redirection. Has difficulty carrying over cues for body mechanics during sit to stand and needing increased assist managing walker especially when turning. Patient overall min A x1 with ambulation and additional +1 follow for safety/equipment management. Recommend continued acute OT services to maximize patient safety with self care in order to D/C to venue listed below.      Recommendations for follow up therapy are one component of a multi-disciplinary discharge planning process, led by the attending physician.  Recommendations may be updated based on patient status, additional functional criteria and insurance authorization.   Follow Up Recommendations  SNF    Equipment Recommendations  None recommended by OT       Precautions / Restrictions Precautions Precautions: Fall Restrictions LLE Weight Bearing: Weight bearing as tolerated      Mobility Bed Mobility Overal bed mobility: Needs Assistance Bed Mobility: Supine to Sit     Supine to sit: Mod assist;+2 for physical assistance;HOB elevated     General bed mobility comments: has difficulty following multimodal cues, needing mod A x2 for leg management and elevating trunk    Transfers Overall transfer level: Needs  assistance Equipment used: Rolling walker (2 wheeled) Transfers: Sit to/from Stand Sit to Stand: Min assist;From elevated surface;+2 safety/equipment         General transfer comment: bed height elevated to what spouse states is similar to home, poor carry over of body mechanics despite multimodal cues. min A x2 for safety initially, progress to min A x1 with ambulation using walker and additional assist for line management/safety. patient needing increased assist managing walker especially with turning    Balance Overall balance assessment: Needs assistance;History of Falls Sitting-balance support: Feet supported Sitting balance-Leahy Scale: Fair     Standing balance support: Bilateral upper extremity supported Standing balance-Leahy Scale: Poor Standing balance comment: reliant on external assist + UE support                           ADL either performed or assessed with clinical judgement   ADL Overall ADL's : Needs assistance/impaired Eating/Feeding: Set up;Sitting   Grooming: Supervision/safety;Set up;Sitting   Upper Body Bathing: Supervision/ safety;Set up;Sitting   Lower Body Bathing: Moderate assistance;Sitting/lateral leans;Sit to/from stand   Upper Body Dressing : Supervision/safety;Set up;Sitting   Lower Body Dressing: Sitting/lateral leans;Sit to/from stand;Moderate assistance   Toilet Transfer: Minimal assistance;Cueing for safety;Cueing for sequencing;+2 for safety/equipment;Ambulation;RW Toilet Transfer Details (indicate cue type and reason): patient with poor carry over of multimodal cues for body mechanics during sit to stand. needs assist managing walker especially with turning. initially taking small shuffled steps with moderate improvement with continued ambulation Toileting- Clothing Manipulation and Hygiene: Moderate assistance;Sit to/from stand;Sitting/lateral lean       Functional mobility during ADLs: Minimal assistance;+2 for  safety/equipment;Cueing for safety;Cueing for  sequencing;Rolling walker General ADL Comments: patient requiring increased assistance with self care tasks due to impaired safety, cognition, balance      Pertinent Vitals/Pain Pain Assessment: No/denies pain     Hand Dominance Right   Extremity/Trunk Assessment Upper Extremity Assessment Upper Extremity Assessment: Overall WFL for tasks assessed   Lower Extremity Assessment Lower Extremity Assessment: Defer to PT evaluation       Communication Communication Communication: No difficulties   Cognition Arousal/Alertness: Awake/alert Behavior During Therapy: WFL for tasks assessed/performed Overall Cognitive Status: Impaired/Different from baseline Area of Impairment: Attention;Following commands;Safety/judgement;Problem solving                   Current Attention Level: Focused   Following Commands: Follows one step commands inconsistently Safety/Judgement: Decreased awareness of safety;Decreased awareness of deficits   Problem Solving: Slow processing;Difficulty sequencing;Requires verbal cues;Requires tactile cues;Decreased initiation General Comments: needing max cues for carry over of multimodal cues during mobility, easily distracted              Home Living Family/patient expects to be discharged to:: Private residence Living Arrangements: Spouse/significant other Available Help at Discharge: Family;Available 24 hours/day Type of Home: House Home Access: Stairs to enter [employer] of Steps: 3 or 4 Entrance Stairs-Rails: Right;Left Home Layout: One level     Bathroom Shower/Tub: Occupational psychologist: Handicapped height Bathroom Accessibility: Yes   Home Equipment: Environmental consultant - 2 wheels;Walker - 4 wheels;Cane - single point;Bedside commode;Wheelchair - manual;Grab bars - tub/shower          Prior Functioning/Environment Level of Independence: Independent                  OT Problem List: Decreased activity tolerance;Impaired balance (sitting and/or standing);Decreased cognition;Decreased safety awareness;Decreased knowledge of use of DME or AE      OT Treatment/Interventions: Self-care/ADL training;DME and/or AE instruction;Therapeutic activities;Cognitive remediation/compensation;Patient/family education;Balance training    OT Goals(Current goals can be found in the care plan section) Acute Rehab OT Goals Patient Stated Goal: go to rehab OT Goal Formulation: With family Time For Goal Achievement: 03/14/21 Potential to Achieve Goals: Good  OT Frequency: Min 2X/week           Co-evaluation PT/OT/SLP Co-Evaluation/Treatment: Yes Reason for Co-Treatment: For patient/therapist safety;To address functional/ADL transfers PT goals addressed during session: Mobility/safety with mobility OT goals addressed during session: ADL's and self-care      AM-PAC OT "6 Clicks" Daily Activity     Outcome Measure Help from another person eating meals?: A Little Help from another person taking care of personal grooming?: A Little Help from another person toileting, which includes using toliet, bedpan, or urinal?: A Lot Help from another person bathing (including washing, rinsing, drying)?: A Lot Help from another person to put on and taking off regular upper body clothing?: A Little Help from another person to put on and taking off regular lower body clothing?: A Lot 6 Click Score: 15   End of Session Equipment Utilized During Treatment: Gait belt;Rolling walker Nurse Communication: Mobility status  Activity Tolerance: Patient tolerated treatment well Patient left: in chair;with call bell/phone within reach;with chair alarm set;with family/visitor present  OT Visit Diagnosis: Unsteadiness on feet (R26.81);Other abnormalities of gait and mobility (R26.89);History of falling (Z91.81);Other symptoms and signs involving cognitive function                Time:  5465-6812 OT Time Calculation (min): 28 min Charges:  OT General Charges $OT Visit: 1 Visit OT  Evaluation $OT Eval Moderate Complexity: 1 Mod  Delbert Phenix OT OT pager: [phone]  Rosemary Holms 02/28/2021, 12:28 PM

## 2021-02-28 NOTE — Plan of Care (Signed)
  Problem: Nutrition: Goal: Adequate nutrition will be maintained Outcome: Progressing   Problem: Pain Managment: Goal: General experience of comfort will improve Outcome: Progressing   

## 2021-02-28 NOTE — Evaluation (Signed)
Physical Therapy Evaluation Patient Details Name: Darlene Marsh MRN: [id number] DOB: 1951/02/21 Today's Date: 02/28/2021  History of Present Illness  Patient is a 70 year old female with fall at home resulting in L hip fracture. S/p L THA, anterior approach on 9/29. PMH includes GBM diagnosed earlier this year, s/p craniotomy and radiation.  Now on Temodar consolidation chemo, seizure disorder on keppra.  Clinical Impression  Patient lives at home with spouse and is typically independent with mobility and does not assistive device for ambulation. Currently patient presents with cognitive deficits (unsure if changed from baseline/dx of GBM) needing max multimodal cues for task initiation, sequencing and redirection. Has difficulty carrying over cues for body mechanics during sit to stand and needing increased assist managing walker especially when turning. Recommend continued acute PT services to maximize patient IND and safety in order to D/C to venue listed below.      Recommendations for follow up therapy are one component of a multi-disciplinary discharge planning process, led by the attending physician.  Recommendations may be updated based on patient status, additional functional criteria and insurance authorization.  Follow Up Recommendations SNF    Equipment Recommendations  None recommended by PT    Recommendations for Other Services       Precautions / Restrictions Precautions Precautions: Fall Restrictions Weight Bearing Restrictions: No LLE Weight Bearing: Weight bearing as tolerated      Mobility  Bed Mobility Overal bed mobility: Needs Assistance Bed Mobility: Supine to Sit     Supine to sit: Mod assist;+2 for physical assistance;HOB elevated     General bed mobility comments: has difficulty following multimodal cues, needing mod A x2 for leg management and elevating trunk    Transfers Overall transfer level: Needs assistance Equipment used: Rolling walker (2  wheeled) Transfers: Sit to/from Stand Sit to Stand: Min assist;From elevated surface;+2 safety/equipment         General transfer comment: bed height elevated to what spouse states is similar to home, poor carry over of body mechanics despite multimodal cues.  Ambulation/Gait Ambulation/Gait assistance: Min assist;+2 physical assistance;+2 safety/equipment Gait Distance (Feet): 120 Feet Assistive device: Rolling walker (2 wheeled) Gait Pattern/deviations: Step-to pattern;Step-through pattern;Decreased step length - right;Decreased step length - left;Shuffle;Wide base of support     General Gait Details: Cues for posture, position from RW, safety awareness and sequence with pt agreeable to all cues but with limited follow through  Stairs            Wheelchair Mobility    Modified Rankin (Stroke Patients Only)       Balance Overall balance assessment: Needs assistance;History of Falls Sitting-balance support: Feet supported Sitting balance-Leahy Scale: Fair     Standing balance support: Bilateral upper extremity supported Standing balance-Leahy Scale: Poor Standing balance comment: reliant on external assist + UE support                             Pertinent Vitals/Pain Pain Assessment: No/denies pain    Home Living Family/patient expects to be discharged to:: Private residence Living Arrangements: Spouse/significant other Available Help at Discharge: Family;Available 24 hours/day Type of Home: House Home Access: Stairs to enter Entrance Stairs-Rails: Psychiatric nurse of Steps: 3 or 4 Home Layout: One level Home Equipment: Walker - 2 wheels;Walker - 4 wheels;Cane - single point;Bedside commode;Wheelchair - manual;Grab bars - tub/shower      Prior Function Level of Independence: Independent  Comments: Loves to dance, working in the yard, and cooking     Journalist, newspaper   Dominant Hand: Right    Extremity/Trunk  Assessment   Upper Extremity Assessment Upper Extremity Assessment: Overall WFL for tasks assessed    Lower Extremity Assessment Lower Extremity Assessment: LLE deficits/detail       Communication   Communication: No difficulties  Cognition Arousal/Alertness: Awake/alert Behavior During Therapy: WFL for tasks assessed/performed Overall Cognitive Status: Impaired/Different from baseline Area of Impairment: Attention;Following commands;Safety/judgement;Problem solving                   Current Attention Level: Focused   Following Commands: Follows one step commands inconsistently Safety/Judgement: Decreased awareness of safety;Decreased awareness of deficits   Problem Solving: Slow processing;Difficulty sequencing;Requires verbal cues;Requires tactile cues;Decreased initiation General Comments: needing max cues for carry over of multimodal cues during mobility, easily distracted      General Comments      Exercises     Assessment/Plan    PT Assessment Patient needs continued PT services  PT Problem List Decreased strength;Decreased range of motion;Decreased activity tolerance;Decreased balance;Decreased mobility;Decreased cognition;Decreased knowledge of use of DME       PT Treatment Interventions DME instruction;Gait training;Stair training;Functional mobility training;Therapeutic activities;Therapeutic exercise;Patient/family education;Balance training    PT Goals (Current goals can be found in the Care Plan section)  Acute Rehab PT Goals Patient Stated Goal: go to rehab PT Goal Formulation: With patient/family Time For Goal Achievement: 03/07/21 Potential to Achieve Goals: Good    Frequency Min 5X/week   Barriers to discharge        Co-evaluation PT/OT/SLP Co-Evaluation/Treatment: Yes Reason for Co-Treatment: For patient/therapist safety PT goals addressed during session: Mobility/safety with mobility OT goals addressed during session: ADL's and  self-care       AM-PAC PT "6 Clicks" Mobility  Outcome Measure Help needed turning from your back to your side while in a flat bed without using bedrails?: A Lot Help needed moving from lying on your back to sitting on the side of a flat bed without using bedrails?: A Lot Help needed moving to and from a bed to a chair (including a wheelchair)?: A Lot Help needed standing up from a chair using your arms (e.g., wheelchair or bedside chair)?: A Lot Help needed to walk in hospital room?: A Little Help needed climbing 3-5 steps with a railing? : A Lot 6 Click Score: 13    End of Session Equipment Utilized During Treatment: Gait belt Activity Tolerance: Patient tolerated treatment well Patient left: in chair;with call bell/phone within reach;with chair alarm set;with family/visitor present Nurse Communication: Mobility status PT Visit Diagnosis: Difficulty in walking, not elsewhere classified (R26.2);Unsteadiness on feet (R26.81)    Time: 1740-8144 PT Time Calculation (min) (ACUTE ONLY): 28 min   Charges:   PT Evaluation $PT Eval Moderate Complexity: [place] Pager [phone] Office [place] 02/28/2021, 1:38 PM

## 2021-02-28 NOTE — Progress Notes (Signed)
PROGRESS NOTE  Darlene Marsh YOK:[id number] DOB: November 27, 1950 DOA: 02/27/2021 PCP: Pcp, No   LOS: 1 day   Brief Narrative / Interim history: Darlene Marsh is a 70 y.o. 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  Now on Temodar consolidation chemo.  Also seizure disorder on keppra. Pt presents to ED with c/o L hip pain following mechanical fall at home.  She was found to have a left femoral neck fracture, orthopedic surgery consulted  Subjective / 24h Interval events: Complains of hip pain with movement.  Denies any loss of consciousness, seizures at home and this was a mechanical fall  Assessment & Plan: Principal Problem Left femoral neck fracture-orthopedic surgery consulted and followed patient while hospitalized.  She was taken to the operating room on 9/29 and she is status post total hip arthroplasty by Dr. Lyla Glassing. -PT pending, DVT prophylaxis per orthopedic surgery with full dose aspirin  Active Problems GBM, seizure disorder-follows with Dr. Mickeal Skinner, continue Decadron, Keppra  Hypothyroidism-does not appear to be on thyroid meds, TSH normal.  Outpatient follow-up  Leukocytosis-monitor, afebrile, potentially reactive   Scheduled Meds:  aspirin EC  325 mg Oral Q breakfast   docusate sodium  100 mg Oral BID   feeding supplement  1 Container Oral BID BM   levETIRAcetam  1,000 mg Oral BID   senna  1 tablet Oral BID   Continuous Infusions:  methocarbamol (ROBAXIN) IV     PRN Meds:.acetaminophen, HYDROcodone-acetaminophen, HYDROcodone-acetaminophen, menthol-cetylpyridinium **OR** phenol, methocarbamol **OR** methocarbamol (ROBAXIN) IV, metoCLOPramide **OR** metoCLOPramide (REGLAN) injection, morphine injection, ondansetron **OR** ondansetron (ZOFRAN) IV  Diet Orders (From admission, onward)     Start     Ordered   02/27/21 2042  Diet regular Room service appropriate? Yes; Fluid consistency: Thin  Diet effective now        Question Answer Comment  Room service appropriate? Yes   Fluid consistency: Thin      02/27/21 2041            DVT prophylaxis: SCDs Start: 02/27/21 2042     Code Status: Full Code  Family Communication: Husband present at the bedside  Status is: Inpatient  Remains inpatient appropriate because:Inpatient level of care appropriate due to severity of illness  Dispo: The patient is from: Home              Anticipated d/c is to: SNF              Patient currently is not medically stable to d/c.   Difficult to place patient No  Level of care: Med-Surg  Consultants:  Orthopedic surgery   Procedures:  none  Microbiology  none  Antimicrobials: none    Objective: Vitals:   02/27/21 2234 02/28/21 0138 02/28/21 0559 02/28/21 0931  BP: 116/79 114/72 112/64 118/72  Pulse: 88 83 92 94  Resp: 15 15 15 18   Temp: 97.8 F (36.6 C) 97.9 F (36.6 C) 98.4 F (36.9 C) 98.9 F (37.2 C)  TempSrc: Oral Oral Oral Oral  SpO2: 100% 100% 100% 98%  Weight:      Height:        Intake/Output Summary (Last 24 hours) at 02/28/2021 1110 Last data filed at 02/28/2021 1000 Gross per 24 hour  Intake 2200 ml  Output 2550 ml  Net -350 ml   Filed Weights   02/27/21 0339 02/27/21 1333  Weight: 61.2 kg 61.2 kg    Examination:  Constitutional: NAD Eyes: lids and conjunctivae normal,  no scleral icterus ENMT: mmm Neck: normal, supple Respiratory: clear to auscultation bilaterally, no wheezing, no crackles. Normal respiratory effort.  Cardiovascular: Regular rate and rhythm, no murmurs / rubs / gallops. No LE edema. Abdomen: soft, no distention, no tenderness. Bowel sounds positive.  Skin: no rashes Neurologic: no focal deficits, equal strength   Data Reviewed: I have independently reviewed following labs and imaging studies   CBC: Recent Labs  Lab 02/27/21 0411 02/28/21 0307  WBC 13.8* 17.4*  NEUTROABS 10.8*  --   HGB 11.5* 10.6*  HCT 34.0* 31.1*  MCV 100.0 98.4   PLT 307 979    Basic Metabolic Panel: Recent Labs  Lab 02/27/21 0411 02/28/21 0307  NA 144 138  K 3.2* 4.3  CL 106 105  CO2 30 26  GLUCOSE 108* 144*  BUN 9 10  CREATININE 0.56 0.53  CALCIUM 8.7* 8.5*    Liver Function Tests: Recent Labs  Lab 02/27/21 0411 02/28/21 0307  AST 16 19  ALT 18 22  ALKPHOS 83 67  BILITOT 0.9 0.8  PROT 6.3* 5.6*  ALBUMIN 3.5 2.8*    Coagulation Profile: No results for input(s): INR, PROTIME in the last 168 hours. HbA1C: No results for input(s): HGBA1C in the last 72 hours. CBG: No results for input(s): GLUCAP in the last 168 hours.  Recent Results (from the past 240 hour(s))  Surgical pcr screen     Status: None   Collection Time: 02/27/21  9:05 AM   Specimen: Nasal Mucosa; Nasal Swab  Result Value Ref Range Status   MRSA, PCR NEGATIVE NEGATIVE Final   Staphylococcus aureus NEGATIVE NEGATIVE Final    Comment: (NOTE) The Xpert SA Assay (FDA approved for NASAL specimens in patients 86 years of age and older), is one component of a comprehensive surveillance program. It is not intended to diagnose infection nor to guide or monitor treatment. Performed at [facility], [place] [address] [place], [place] [address]   SARS Coronavirus 2 by RT PCR (hospital order, performed in [facility] hospital lab) Nasopharyngeal Nasopharyngeal Swab     Status: None   Collection Time: 02/27/21 10:55 AM   Specimen: Nasopharyngeal Swab  Result Value Ref Range Status   SARS Coronavirus 2 NEGATIVE NEGATIVE Final    Comment: (NOTE) SARS-CoV-2 target nucleic acids are NOT DETECTED.  The SARS-CoV-2 RNA is generally detectable in upper and lower respiratory specimens during the acute phase of infection. The lowest concentration of SARS-CoV-2 viral copies this assay can detect is 250 copies / mL. A negative result does not preclude SARS-CoV-2 infection and should not be used as the sole basis for treatment or other patient management  decisions.  A negative result may occur with improper specimen collection / handling, submission of specimen other than nasopharyngeal swab, presence of viral mutation(s) within the areas targeted by this assay, and inadequate number of viral copies (<250 copies / mL). A negative result must be combined with clinical observations, patient history, and epidemiological information.  Fact Sheet for Patients:   StrictlyIdeas.no  Fact Sheet for Healthcare Providers: BankingDealers.co.za  This test is not yet approved or  cleared by the [place] FDA and has been authorized for detection and/or diagnosis of SARS-CoV-2 by FDA under an Emergency Use Authorization (EUA).  This EUA will remain in effect (meaning this test can be used) for the duration of the COVID-19 declaration under Section 564(b)(1) of the Act, 21 U.S.C. section 360bbb-3(b)(1), unless the authorization is terminated or revoked sooner.  Performed at [facility], [place] [address], [place], [place] [address]      Radiology Studies: Pelvis Portable  Result Date: 02/27/2021 CLINICAL DATA:  Left hip replacement EXAM: PORTABLE PELVIS 1-2 VIEWS COMPARISON:  02/27/2021 FINDINGS: Gas within the soft tissues consistent with recent surgery. Interval left hip replacement with intact hardware and normal alignment IMPRESSION: Interval left hip replacement with expected surgical change Electronically Signed   By: Donavan Foil M.D.   On: 02/27/2021 20:15   DG C-Arm 1-60 Min-No Report  Result Date: 02/27/2021 Fluoroscopy was utilized by the requesting physician.  No radiographic interpretation.   DG HIP OPERATIVE UNILAT W OR W/O PELVIS LEFT  Result Date: 02/27/2021 CLINICAL DATA:  Hip surgery EXAM: OPERATIVE left HIP (WITH PELVIS IF PERFORMED) 2 VIEWS TECHNIQUE: Fluoroscopic spot image(s) were submitted for interpretation post-operatively. COMPARISON:  02/27/2021  FINDINGS: Two low resolution intraoperative spot views of the left hip. Total fluoroscopy time was 11 seconds. The images demonstrate a left hip replacement with intact hardware and normal alignment IMPRESSION: Intraoperative fluoroscopic assistance provided during left hip replacement Electronically Signed   By: Donavan Foil M.D.   On: 02/27/2021 20:15     Marzetta Board, MD, PhD [facility]  Between 7 am - 7 pm I am available, please contact me via Amion (for emergencies) or Securechat (non urgent messages)  Between 7 pm - 7 am I am not available, please contact night coverage MD/APP via Amion

## 2021-02-28 NOTE — Progress Notes (Signed)
   02/28/21 2109  Escalate  MEWS: Escalate Yellow: discuss with charge nurse/RN and consider discussing with provider and RRT (not a true escalation)  Notify: Charge Nurse/RN  Name of Charge Nurse/RN Notified Gerald Stabs RN  Date Charge Nurse/RN Notified 02/28/21  Time Charge Nurse/RN Notified 2145  Notify: Provider  Provider Name/Title Bl[place] NP  Date Provider Notified 02/28/21  Time Provider Notified 2137  Notification Type Page  Notification Reason Other (Comment) (pulse 118 now 104 after pain med)  Notify: Rapid Response  Name of Rapid Response RN Notified Janelle RN  Date Rapid Response Notified 02/28/21  Time Rapid Response Notified 2142  Will  continue to monitor pt's pulse, pain medication has helped resolve

## 2021-03-01 LAB — CBC
HCT: 28.1 % — ABNORMAL LOW (ref 36.0–46.0)
Hemoglobin: 9.6 g/dL — ABNORMAL LOW (ref 12.0–15.0)
MCH: 34.3 pg — ABNORMAL HIGH (ref 26.0–34.0)
MCHC: 34.2 g/dL (ref 30.0–36.0)
MCV: 100.4 fL — ABNORMAL HIGH (ref 80.0–100.0)
Platelets: 279 10*3/uL (ref 150–400)
RBC: 2.8 MIL/uL — ABNORMAL LOW (ref 3.87–5.11)
RDW: 13.7 % (ref 11.5–15.5)
WBC: 14 10*3/uL — ABNORMAL HIGH (ref 4.0–10.5)
nRBC: 0 % (ref 0.0–0.2)

## 2021-03-01 LAB — BASIC METABOLIC PANEL
Anion gap: 9 (ref 5–15)
BUN: 10 mg/dL (ref 8–23)
CO2: 29 mmol/L (ref 22–32)
Calcium: 8.3 mg/dL — ABNORMAL LOW (ref 8.9–10.3)
Chloride: 107 mmol/L (ref 98–111)
Creatinine, Ser: 0.59 mg/dL (ref 0.44–1.00)
GFR, Estimated: >60 mL/min (ref >60)
Glucose, Bld: 99 mg/dL (ref 70–99)
Potassium: 2.7 mmol/L — CL (ref 3.5–5.1)
Sodium: 145 mmol/L (ref 135–145)

## 2021-03-01 MED ORDER — DEXAMETHASONE 0.5 MG PO TABS
1.0000 mg | ORAL_TABLET | Freq: Every day | ORAL | Status: DC
  Administered 2021-03-01 – 2021-03-03 (×3): 1 mg via ORAL
  Filled 2021-03-01 (×3): qty 2

## 2021-03-01 MED ORDER — POTASSIUM CHLORIDE CRYS ER 20 MEQ PO TBCR
40.0000 meq | EXTENDED_RELEASE_TABLET | Freq: Once | ORAL | Status: AC
  Administered 2021-03-01: 40 meq via ORAL
  Filled 2021-03-01: qty 2

## 2021-03-01 MED ORDER — POTASSIUM CHLORIDE CRYS ER 20 MEQ PO TBCR
40.0000 meq | EXTENDED_RELEASE_TABLET | Freq: Two times a day (BID) | ORAL | Status: DC
  Administered 2021-03-01: 40 meq via ORAL
  Filled 2021-03-01: qty 2

## 2021-03-01 NOTE — Progress Notes (Addendum)
PROGRESS NOTE  Darlene Marsh EPP:[id number] DOB: [birth year] DOA: 02/27/2021 PCP: Pcp, No   LOS: 2 days   Brief Narrative / Interim history: Darlene Marsh is a 70 y.o. 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  Now on Temodar consolidation chemo.  Also seizure disorder on keppra. Pt presents to ED with c/o L hip pain following mechanical fall at home.  She was found to have a left femoral neck fracture, orthopedic surgery consulted  Subjective / 24h Interval events: Feels better, was able to work with PT yesterday.  Denies any chest pain, denies any shortness of breath  Assessment & Plan: Principal Problem Left femoral neck fracture-orthopedic surgery consulted and followed patient while hospitalized.  She was taken to the operating room on 9/29 and she is status post total hip arthroplasty by Dr. Lyla Glassing. -DVT prophylaxis per orthopedic surgery with full dose aspirin -PT recommends SNF, family agreeable.  Active Problems GBM, seizure disorder-follows with Dr. Mickeal Skinner, continue Decadron, Keppra  Hypothyroidism-does not appear to be on thyroid meds, TSH normal.  Outpatient follow-up  Acute blood loss anemia-postoperatively, no need for transfusions, monitor  Leukocytosis-monitor, afebrile, potentially reactive   Scheduled Meds:  aspirin EC  325 mg Oral Q breakfast   dexamethasone  1 mg Oral Daily   docusate sodium  100 mg Oral BID   feeding supplement  1 Container Oral BID BM   levETIRAcetam  1,000 mg Oral BID   senna  1 tablet Oral BID   Continuous Infusions:  methocarbamol (ROBAXIN) IV     PRN Meds:.acetaminophen, HYDROcodone-acetaminophen, HYDROcodone-acetaminophen, hydrOXYzine, menthol-cetylpyridinium **OR** phenol, methocarbamol **OR** methocarbamol (ROBAXIN) IV, metoCLOPramide **OR** metoCLOPramide (REGLAN) injection, morphine injection, ondansetron **OR** ondansetron (ZOFRAN) IV  Diet Orders (From admission,  onward)     Start     Ordered   02/27/21 2042  Diet regular Room service appropriate? Yes; Fluid consistency: Thin  Diet effective now       Question Answer Comment  Room service appropriate? Yes   Fluid consistency: Thin      02/27/21 2041            DVT prophylaxis: SCDs Start: 02/27/21 2042     Code Status: Full Code  Family Communication: Husband present at the bedside  Status is: Inpatient  Remains inpatient appropriate because:Inpatient level of care appropriate due to severity of illness  Dispo: The patient is from: Home              Anticipated d/c is to: SNF              Patient currently is not medically stable to d/c.   Difficult to place patient No  Level of care: Med-Surg  Consultants:  Orthopedic surgery   Procedures:  none  Microbiology  none  Antimicrobials: none    Objective: Vitals:   02/28/21 2118 02/28/21 2304 03/01/21 0419 03/01/21 1338  BP:   135/73 (!) 159/82  Pulse: (!) 104 100 (!) 110 98  Resp:   16 16  Temp:   99 F (37.2 C) 98.2 F (36.8 C)  TempSrc:   Oral   SpO2:   96% 99%  Weight:      Height:        Intake/Output Summary (Last 24 hours) at 03/01/2021 1341 Last data filed at 03/01/2021 1000 Gross per 24 hour  Intake 120 ml  Output 177 ml  Net -57 ml    Filed Weights   02/27/21 0339 02/27/21 1333  Weight: 61.2 kg 61.2 kg    Examination:  Constitutional: She is in no distress Eyes: Anicteric ENMT: Moist mucous membranes Neck: normal, supple Respiratory: Clear bilaterally, no wheezing, no crackles Cardiovascular: Regular rate and rhythm, no murmurs, no peripheral edema Abdomen: Soft, NT, ND, bowel sounds positive Skin: no rashes Neurologic: No focal deficits   Data Reviewed: I have independently reviewed following labs and imaging studies   CBC: Recent Labs  Lab 02/27/21 0411 02/28/21 0307 03/01/21 0321  WBC 13.8* 17.4* 14.0*  NEUTROABS 10.8*  --   --   HGB 11.5* 10.6* 9.6*  HCT 34.0* 31.1*  28.1*  MCV 100.0 98.4 100.4*  PLT 307 294 740    Basic Metabolic Panel: Recent Labs  Lab 02/27/21 0411 02/28/21 0307 03/01/21 0321  NA 144 138 145  K 3.2* 4.3 2.7*  CL 106 105 107  CO2 30 26 29   GLUCOSE 108* 144* 99  BUN 9 10 10   CREATININE 0.56 0.53 0.59  CALCIUM 8.7* 8.5* 8.3*    Liver Function Tests: Recent Labs  Lab 02/27/21 0411 02/28/21 0307  AST 16 19  ALT 18 22  ALKPHOS 83 67  BILITOT 0.9 0.8  PROT 6.3* 5.6*  ALBUMIN 3.5 2.8*    Coagulation Profile: No results for input(s): INR, PROTIME in the last 168 hours. HbA1C: No results for input(s): HGBA1C in the last 72 hours. CBG: No results for input(s): GLUCAP in the last 168 hours.  Recent Results (from the past 240 hour(s))  Surgical pcr screen     Status: None   Collection Time: 02/27/21  9:05 AM   Specimen: Nasal Mucosa; Nasal Swab  Result Value Ref Range Status   MRSA, PCR NEGATIVE NEGATIVE Final   Staphylococcus aureus NEGATIVE NEGATIVE Final    Comment: (NOTE) The Xpert SA Assay (FDA approved for NASAL specimens in patients 74 years of age and older), is one component of a comprehensive surveillance program. It is not intended to diagnose infection nor to guide or monitor treatment. Performed at [facility], [place] [address] [place], [place] [address]   SARS Coronavirus 2 by RT PCR (hospital order, performed in [facility] hospital lab) Nasopharyngeal Nasopharyngeal Swab     Status: None   Collection Time: 02/27/21 10:55 AM   Specimen: Nasopharyngeal Swab  Result Value Ref Range Status   SARS Coronavirus 2 NEGATIVE NEGATIVE Final    Comment: (NOTE) SARS-CoV-2 target nucleic acids are NOT DETECTED.  The SARS-CoV-2 RNA is generally detectable in upper and lower respiratory specimens during the acute phase of infection. The lowest concentration of SARS-CoV-2 viral copies this assay can detect is 250 copies / mL. A negative result does not preclude SARS-CoV-2 infection and  should not be used as the sole basis for treatment or other patient management decisions.  A negative result may occur with improper specimen collection / handling, submission of specimen other than nasopharyngeal swab, presence of viral mutation(s) within the areas targeted by this assay, and inadequate number of viral copies (<250 copies / mL). A negative result must be combined with clinical observations, patient history, and epidemiological information.  Fact Sheet for Patients:   StrictlyIdeas.no  Fact Sheet for Healthcare Providers: BankingDealers.co.za  This test is not yet approved or  cleared by the [place] FDA and has been authorized for detection and/or diagnosis of SARS-CoV-2 by FDA under an Emergency Use Authorization (EUA).  This EUA will remain in effect (meaning this test can be used) for the duration of  the COVID-19 declaration under Section 564(b)(1) of the Act, 21 U.S.C. section 360bbb-3(b)(1), unless the authorization is terminated or revoked sooner.  Performed at [facility], [place] [address], [place], [place] [address]      Radiology Studies: No results found.   Marzetta Board, MD, PhD [facility]  Between 7 am - 7 pm I am available, please contact me via Amion (for emergencies) or Securechat (non urgent messages)  Between 7 pm - 7 am I am not available, please contact night coverage MD/APP via Amion

## 2021-03-01 NOTE — NC FL2 (Signed)
[place] LEVEL OF CARE SCREENING TOOL     IDENTIFICATION  Patient Name: Darlene Marsh Birthdate: 1951-06-01 Sex: female Admission Date (Current Location): 02/27/2021  [facility] and [place] Number:  Herbalist and Address:  [facility],  [place] [place], [place]      Provider Number: [id number]  Attending Physician Name and Address:  Caren Griffins, MD  Relative Name and Phone Number:  spouse, Alayzia Pavlock [phone]    Current Level of Care: Hospital Recommended Level of Care: [place] Prior Approval Number:    Date Approved/Denied:   PASRR Number: [id number]  Discharge Plan: SNF    Current Diagnoses: Patient Active Problem List   Diagnosis Date Noted   Closed displaced fracture of left femoral neck ([place]) 02/27/2021   Insomnia 09/25/2020   Seizure disorder ([place]) 09/25/2020   Dyslipidemia    Tremor    Postoperative pain    Glioblastoma multiforme of frontal lobe ([place]) 09/05/2020   Essential hypertension 01/05/2018   Hypothyroidism 08/23/2017   Hyperlipidemia 08/23/2017    Orientation RESPIRATION BLADDER Height & Weight     Self, Time, Situation, Place  Normal External catheter, Incontinent (purewick in place) Weight: 134 lb 14.7 oz (61.2 kg) Height:  5\' 6"  (167.6 cm)  BEHAVIORAL SYMPTOMS/MOOD NEUROLOGICAL BOWEL NUTRITION STATUS      Continent    AMBULATORY STATUS COMMUNICATION OF NEEDS Skin   Limited Assist    (incision site)                       Personal Care Assistance Level of Assistance  Bathing, Dressing Bathing Assistance: Limited assistance   Dressing Assistance: Limited assistance     Functional Limitations Info  Speech     Speech Info: Impaired    SPECIAL CARE FACTORS FREQUENCY  PT (By licensed PT), OT (By licensed OT)     PT Frequency: 5x/wk OT Frequency: 5x/wk            Contractures Contractures Info: Not present    Additional Factors Info  Code  Status, Allergies, Psychotropic Code Status Info: Full Allergies Info: NKDA Psychotropic Info: see MAR         Current Medications (03/01/2021):  This is the current hospital active medication list Current Facility-Administered Medications  Medication Dose Route Frequency Provider Last Rate Last Admin   acetaminophen (TYLENOL) tablet 325-650 mg  325-650 mg Oral Q6H PRN Swinteck, Aaron Edelman, MD       aspirin EC tablet 325 mg  325 mg Oral Q breakfast Rod Can, MD   325 mg at 03/01/21 0834   dexamethasone (DECADRON) tablet 1 mg  1 mg Oral Daily Caren Griffins, MD   1 mg at 03/01/21 1016   docusate sodium (COLACE) capsule 100 mg  100 mg Oral BID Rod Can, MD   100 mg at 03/01/21 0915   feeding supplement (BOOST / RESOURCE BREEZE) liquid 1 Container  1 Container Oral BID BM Swinteck, Aaron Edelman, MD   1 Container at 03/01/21 1156   HYDROcodone-acetaminophen (NORCO) 7.5-325 MG per tablet 1-2 tablet  1-2 tablet Oral Q4H PRN Swinteck, Aaron Edelman, MD       HYDROcodone-acetaminophen (NORCO/VICODIN) 5-325 MG per tablet 1-2 tablet  1-2 tablet Oral Q4H PRN Rod Can, MD   1 tablet at 03/01/21 0834   hydrOXYzine (ATARAX/VISTARIL) tablet 10 mg  10 mg Oral TID PRN Caren Griffins, MD       levETIRAcetam (KEPPRA) tablet 1,000 mg  1,000 mg Oral BID Rod Can, MD   1,000 mg at 03/01/21 9292   menthol-cetylpyridinium (CEPACOL) lozenge 3 mg  1 lozenge Oral PRN Rod Can, MD       Or   phenol (CHLORASEPTIC) mouth spray 1 spray  1 spray Mouth/Throat PRN Swinteck, Aaron Edelman, MD       methocarbamol (ROBAXIN) tablet 500 mg  500 mg Oral Q6H PRN Swinteck, Aaron Edelman, MD       Or   methocarbamol (ROBAXIN) 500 mg in dextrose 5 % 50 mL IVPB  500 mg Intravenous Q6H PRN Swinteck, Aaron Edelman, MD       metoCLOPramide (REGLAN) tablet 5-10 mg  5-10 mg Oral Q8H PRN Swinteck, Aaron Edelman, MD       Or   metoCLOPramide (REGLAN) injection 5-10 mg  5-10 mg Intravenous Q8H PRN Swinteck, Aaron Edelman, MD       morphine 2 MG/ML injection  0.5-1 mg  0.5-1 mg Intravenous Q2H PRN Swinteck, Aaron Edelman, MD   1 mg at 02/28/21 2225   ondansetron (ZOFRAN) tablet 4 mg  4 mg Oral Q6H PRN Swinteck, Aaron Edelman, MD       Or   ondansetron (ZOFRAN) injection 4 mg  4 mg Intravenous Q6H PRN Swinteck, Aaron Edelman, MD       senna Donavan Burnet) tablet 8.6 mg  1 tablet Oral BID Rod Can, MD   8.6 mg at 03/01/21 4462     Discharge Medications: Please see discharge summary for a list of discharge medications.  Relevant Imaging Results:  Relevant Lab Results:   Additional Information SS# [id number]  Lennart Pall, LCSW

## 2021-03-01 NOTE — Progress Notes (Signed)
   Subjective: 2 Days Post-Op Procedure(s) (LRB): TOTAL HIP ARTHROPLASTY ANTERIOR APPROACH (Left) Patient reports pain as mild.   Patient is well, and has had no acute complaints or problems. Denies SOB, chest pain, or calf pain. No acute overnight events. Ambulated 120 feet with therapy yesterday. Will continue therapy today.   Plan is to go Home after hospital stay.  Objective: Vital signs in last 24 hours: Temp:  [98.9 F (37.2 C)-99 F (37.2 C)] 99 F (37.2 C) (10/01 0419) Pulse Rate:  [94-116] 110 (10/01 0419) Resp:  [15-18] 16 (10/01 0419) BP: (118-140)/(71-73) 135/73 (10/01 0419) SpO2:  [96 %-100 %] 96 % (10/01 0419)  Intake/Output from previous day:  Intake/Output Summary (Last 24 hours) at 03/01/2021 0842 Last data filed at 03/01/2021 0100 Gross per 24 hour  Intake 0 ml  Output 327 ml  Net -327 ml    Intake/Output this shift: No intake/output data recorded.  Labs: Recent Labs    02/27/21 0411 02/28/21 0307 03/01/21 0321  HGB 11.5* 10.6* 9.6*   Recent Labs    02/28/21 0307 03/01/21 0321  WBC 17.4* 14.0*  RBC 3.16* 2.80*  HCT 31.1* 28.1*  PLT 294 279   Recent Labs    02/28/21 0307 03/01/21 0321  NA 138 145  K 4.3 2.7*  CL 105 107  CO2 26 29  BUN 10 10  CREATININE 0.53 0.59  GLUCOSE 144* 99  CALCIUM 8.5* 8.3*   No results for input(s): LABPT, INR in the last 72 hours.  Exam: General - Patient is Alert and Oriented Extremity - Neurologically intact Neurovascular intact Intact pulses distally Dorsiflexion/Plantar flexion intact Dressing/Incision - clean, dry, no drainage Motor Function - intact, moving foot and toes well on exam.   Past Medical History:  Diagnosis Date   Hyperlipidemia    Hypertension    Thyroid disease    underactive   Tremors of nervous system     Assessment/Plan: 2 Days Post-Op Procedure(s) (LRB): TOTAL HIP ARTHROPLASTY ANTERIOR APPROACH (Left) Principal Problem:   Closed displaced fracture of left femoral  neck (HCC) Active Problems:   Hypothyroidism   Essential hypertension   Glioblastoma multiforme of frontal lobe (HCC)   Seizure disorder (HCC)  Estimated body mass index is 21.78 kg/m as calculated from the following:   Height as of this encounter: 5\' 6"  (1.676 m).   Weight as of this encounter: 61.2 kg. Advance diet Up with therapy  DVT Prophylaxis - Aspirin, TED hose, and SCD's Weight-bearing as tolerated with walker  Plan for two sessions with PT today, and if meeting goals, will plan for discharge from an orthopedic standpoint.     Fenton Foy, MBA, PA-C Orthopedic Surgery 03/01/2021, 8:42 AM

## 2021-03-01 NOTE — Plan of Care (Signed)
  Problem: Education: Goal: Knowledge of General Education information will improve Description: Including pain rating scale, medication(s)/side effects and non-pharmacologic comfort measures Outcome: Progressing   Problem: Health Behavior/Discharge Planning: Goal: Ability to manage health-related needs will improve Outcome: Progressing   Problem: Clinical Measurements: Goal: Ability to maintain clinical measurements within normal limits will improve Outcome: Progressing Goal: Will remain free from infection Outcome: Progressing Goal: Diagnostic test results will improve Outcome: Progressing Goal: Respiratory complications will improve Outcome: Progressing Goal: Cardiovascular complication will be avoided Outcome: Progressing   Problem: Activity: Goal: Risk for activity intolerance will decrease Outcome: Progressing   Problem: Nutrition: Goal: Adequate nutrition will be maintained Outcome: Progressing   Problem: Coping: Goal: Level of anxiety will decrease Outcome: Progressing   Problem: Elimination: Goal: Will not experience complications related to bowel motility Outcome: Progressing Goal: Will not experience complications related to urinary retention Outcome: Progressing   Problem: Pain Managment: Goal: General experience of comfort will improve Outcome: Progressing   Problem: Safety: Goal: Ability to remain free from injury will improve Outcome: Progressing   Problem: Skin Integrity: Goal: Risk for impaired skin integrity will decrease Outcome: Progressing   Problem: Education: Goal: Verbalization of understanding the information provided (i.e., activity precautions, restrictions, etc) will improve Outcome: Progressing Goal: Individualized Educational Video(s) Outcome: Progressing   Problem: Activity: Goal: Ability to ambulate and perform ADLs will improve Outcome: Progressing   Problem: Clinical Measurements: Goal: Postoperative complications will be  avoided or minimized Outcome: Progressing   Problem: Self-Concept: Goal: Ability to maintain and perform role responsibilities to the fullest extent possible will improve Outcome: Progressing   Problem: Pain Management: Goal: Pain level will decrease Outcome: Progressing   Problem: Education: Goal: Knowledge of the prescribed therapeutic regimen will improve Outcome: Progressing Goal: Understanding of discharge needs will improve Outcome: Progressing Goal: Individualized Educational Video(s) Outcome: Progressing   Problem: Activity: Goal: Ability to avoid complications of mobility impairment will improve Outcome: Progressing Goal: Ability to tolerate increased activity will improve Outcome: Progressing   Problem: Clinical Measurements: Goal: Postoperative complications will be avoided or minimized Outcome: Progressing   Problem: Pain Management: Goal: Pain level will decrease with appropriate interventions Outcome: Progressing   Problem: Skin Integrity: Goal: Will show signs of wound healing Outcome: Progressing   

## 2021-03-01 NOTE — Plan of Care (Signed)
  Problem: Education: Goal: Knowledge of General Education information will improve Description: Including pain rating scale, medication(s)/side effects and non-pharmacologic comfort measures Outcome: Progressing   Problem: Activity: Goal: Risk for activity intolerance will decrease Outcome: Progressing   Problem: Coping: Goal: Level of anxiety will decrease Outcome: Progressing   

## 2021-03-01 NOTE — Progress Notes (Signed)
Physical Therapy Treatment Patient Details Name: Darlene Marsh MRN: [id number] DOB: July 10, 1950 Today's Date: 03/01/2021   History of Present Illness Patient is a 70 year old female with fall at home resulting in L hip fracture. S/p L THA, anterior approach on 9/29. PMH includes GBM diagnosed earlier this year, s/p craniotomy and radiation.  Now on Temodar consolidation chemo, seizure disorder on keppra.    PT Comments    Pt continues very agreeable and agreeable to all requests/cues but with limited follow through and requiring increased time and repetition of multi-modal cues for completion of tasks.   Recommendations for follow up therapy are one component of a multi-disciplinary discharge planning process, led by the attending physician.  Recommendations may be updated based on patient status, additional functional criteria and insurance authorization.  Follow Up Recommendations  SNF     Equipment Recommendations  None recommended by PT    Recommendations for Other Services       Precautions / Restrictions Precautions Precautions: Fall Restrictions Weight Bearing Restrictions: No LLE Weight Bearing: Weight bearing as tolerated     Mobility  Bed Mobility Overal bed mobility: Needs Assistance Bed Mobility: Supine to Sit     Supine to sit: Mod assist;+2 for physical assistance;HOB elevated     General bed mobility comments: has difficulty following multimodal cues, needing mod A x2 for leg management and elevating trunk    Transfers Overall transfer level: Needs assistance Equipment used: Rolling walker (2 wheeled) Transfers: Sit to/from Stand Sit to Stand: Min assist;From elevated surface;+2 safety/equipment         General transfer comment: bed height elevated to what spouse states is similar to home, poor carry over of body mechanics despite multimodal cues.  Ambulation/Gait Ambulation/Gait assistance: Min assist;Mod assist Gait Distance (Feet): 120  Feet Assistive device: Rolling walker (2 wheeled) Gait Pattern/deviations: Step-to pattern;Step-through pattern;Decreased step length - right;Decreased step length - left;Shuffle;Wide base of support Gait velocity: decr   General Gait Details: Cues for posture, position from RW, safety awareness and sequence with pt agreeable to all cues but with limited follow through.  Pt struggling with concept of stepping back to chair and chair brought up behind pt   Stairs             Wheelchair Mobility    Modified Rankin (Stroke Patients Only)       Balance Overall balance assessment: Needs assistance;History of Falls Sitting-balance support: Feet supported Sitting balance-Leahy Scale: Fair     Standing balance support: Bilateral upper extremity supported Standing balance-Leahy Scale: Poor Standing balance comment: reliant on external assist + UE support                            Cognition Arousal/Alertness: Awake/alert Behavior During Therapy: WFL for tasks assessed/performed Overall Cognitive Status: Impaired/Different from baseline Area of Impairment: Attention;Following commands;Safety/judgement;Problem solving                   Current Attention Level: Focused   Following Commands: Follows one step commands inconsistently Safety/Judgement: Decreased awareness of safety;Decreased awareness of deficits   Problem Solving: Slow processing;Difficulty sequencing;Requires verbal cues;Requires tactile cues;Decreased initiation General Comments: needing max cues for carry over of multimodal cues during mobility, easily distracted      Exercises      General Comments        Pertinent Vitals/Pain Pain Assessment: Faces Faces Pain Scale: Hurts little more Pain Location: L hip Pain Descriptors /  Indicators: Guarding;Grimacing Pain Intervention(s): Limited activity within patient's tolerance;Monitored during session    Home Living                       Prior Function            PT Goals (current goals can now be found in the care plan section) Acute Rehab PT Goals Patient Stated Goal: go to rehab PT Goal Formulation: With patient/family Time For Goal Achievement: 03/07/21 Potential to Achieve Goals: Good Progress towards PT goals: Progressing toward goals    Frequency    Min 5X/week      PT Plan Current plan remains appropriate    Co-evaluation              AM-PAC PT "6 Clicks" Mobility   Outcome Measure  Help needed turning from your back to your side while in a flat bed without using bedrails?: A Lot Help needed moving from lying on your back to sitting on the side of a flat bed without using bedrails?: A Lot Help needed moving to and from a bed to a chair (including a wheelchair)?: A Lot Help needed standing up from a chair using your arms (e.g., wheelchair or bedside chair)?: A Lot Help needed to walk in hospital room?: A Little Help needed climbing 3-5 steps with a railing? : A Lot 6 Click Score: 13    End of Session Equipment Utilized During Treatment: Gait belt Activity Tolerance: Patient tolerated treatment well Patient left: in chair;with call bell/phone within reach;with chair alarm set Nurse Communication: Mobility status PT Visit Diagnosis: Difficulty in walking, not elsewhere classified (R26.2);Unsteadiness on feet (R26.81)     Time: 4696-2952 PT Time Calculation (min) (ACUTE ONLY): 25 min  Charges:  $Gait Training: 8-22 mins $Therapeutic Activity: 8-22 mins                     Debe Coder PT Acute Rehabilitation Services Pager [phone] Office [phone]    Nasreen Goedecke 03/01/2021, 12:21 PM

## 2021-03-01 NOTE — Progress Notes (Signed)
Date and time results received: 03/01/21 0451 (use smartphrase ".now" to insert current time)  Test: K+ Critical Value: 2.7  Name of Provider Notified: [place]  Orders Received? Or Actions Taken?: Actions Taken: await response

## 2021-03-01 NOTE — TOC Initial Note (Signed)
Transition of Care [facility]) - Initial/Assessment Note    Patient Details  Name: Darlene Marsh MRN: [id number] Date of Birth: [birth year]  [facility] CM/SW Contact:    Lennart Pall, LCSW Phone Number: 03/01/2021, 1:31 PM  Clinical Narrative:                 Met with pt, spouse and daughter today to discuss dc planning needs.  All report that pt's current assistance needs are a concern and feel that short term SNF is best option.  We reviewed SNF placement process and preferred facilities.  Will begin bed search.  Expected Discharge Plan: Skilled Nursing Facility Barriers to Discharge: Continued Medical Work up, SNF Pending bed offer   Patient Goals and CMS Choice Patient states their goals for this hospitalization and ongoing recovery are:: to eventually return home      Expected Discharge Plan and Services Expected Discharge Plan: [place]       Living arrangements for the past 2 months: Single Family Home Expected Discharge Date: 03/01/21               DME Arranged: N/A DME Agency: NA                  Prior Living Arrangements/Services Living arrangements for the past 2 months: Single Family Home Lives with:: Spouse Patient language and need for interpreter reviewed:: Yes Do you feel safe going back to the place where you live?: Yes      Need for Family Participation in Patient Care: Yes (Comment) Care giver support system in place?: Yes (comment)   Criminal Activity/Legal Involvement Pertinent to Current Situation/Hospitalization: No - Comment as needed  Activities of Daily Living Home Assistive Devices/Equipment: None ADL Screening (condition at time of admission) Patient's cognitive ability adequate to safely complete daily activities?: No Is the patient deaf or have difficulty hearing?: No Does the patient have difficulty seeing, even when wearing glasses/contacts?: No Does the patient have difficulty concentrating, remembering, or  making decisions?: Yes Patient able to express need for assistance with ADLs?: No Does the patient have difficulty dressing or bathing?: Yes Independently performs ADLs?: No Communication: Needs assistance Is this a change from baseline?: Pre-admission baseline Dressing (OT): Needs assistance Is this a change from baseline?: Pre-admission baseline Walks in Home: Independent Does the patient have difficulty walking or climbing stairs?: No Weakness of Legs: None Weakness of Arms/Hands: None  Permission Sought/Granted Permission sought to share information with : Family Supports, Chartered certified accountant granted to share information with : Yes, Verbal Permission Granted  Share Information with NAME: spouse and daughter listed on chart           Emotional Assessment Appearance:: Appears stated age Attitude/Demeanor/Rapport: Gracious Affect (typically observed): Accepting Orientation: : Oriented to Place, Oriented to Self, Oriented to  Time, Oriented to Situation Alcohol / Substance Use: Not Applicable Psych Involvement: No (comment)  Admission diagnosis:  Closed fracture of left hip, initial encounter ([place]) [S72.002A] Closed displaced fracture of left femoral neck ([place]) [S72.002A] Patient Active Problem List   Diagnosis Date Noted   Closed displaced fracture of left femoral neck ([place]) 02/27/2021   Insomnia 09/25/2020   Seizure disorder ([place]) 09/25/2020   Dyslipidemia    Tremor    Postoperative pain    Glioblastoma multiforme of frontal lobe ([place]) 09/05/2020   Essential hypertension 01/05/2018   Hypothyroidism 08/23/2017   Hyperlipidemia 08/23/2017   PCP:  Pcp, No Pharmacy:   [facility]  [place], [address] [place] [address] Phone: [phone] Fax: [phone]  [place] [address] Phone: [phone]  Fax: [phone]     Social Determinants of Health (SDOH) Interventions    Readmission Risk Interventions No flowsheet data found.

## 2021-03-01 NOTE — Progress Notes (Signed)
Physical Therapy Treatment Patient Details Name: Darlene Marsh MRN: [id number] DOB: July 26, 1950 Today's Date: 03/01/2021   History of Present Illness Patient is a 70 year old female with fall at home resulting in L hip fracture. S/p L THA, anterior approach on 9/29. PMH includes GBM diagnosed earlier this year, s/p craniotomy and radiation.  Now on Temodar consolidation chemo, seizure disorder on keppra.    PT Comments    Pt found sliding down in recliner, assisted back and then to stand, ambulate short distance and into bed.  Pt continues very agreeable and agreeable to all requests/cues but with limited follow through and requiring increased time and repetition of multi-modal cues for completion of tasks.   Recommendations for follow up therapy are one component of a multi-disciplinary discharge planning process, led by the attending physician.  Recommendations may be updated based on patient status, additional functional criteria and insurance authorization.  Follow Up Recommendations  SNF     Equipment Recommendations  None recommended by PT    Recommendations for Other Services       Precautions / Restrictions Precautions Precautions: Fall Restrictions Weight Bearing Restrictions: No LLE Weight Bearing: Weight bearing as tolerated     Mobility  Bed Mobility Overal bed mobility: Needs Assistance Bed Mobility: Sit to Supine     Supine to sit: Mod assist;+2 for physical assistance;HOB elevated Sit to supine: Mod assist;+2 for physical assistance;+2 for safety/equipment   General bed mobility comments: has difficulty following multimodal cues, needing mod A x2 for leg management and elevating trunk    Transfers Overall transfer level: Needs assistance Equipment used: Rolling walker (2 wheeled) Transfers: Sit to/from [facility] Sit to Stand: Min assist;+2 safety/equipment Stand pivot transfers: Min assist;+2 physical assistance       General transfer  comment: multimodal cues for LE management and use of UEs to self assist  Ambulation/Gait Ambulation/Gait assistance: Min assist;Mod assist Gait Distance (Feet): 5 Feet Assistive device: Rolling walker (2 wheeled) Gait Pattern/deviations: Step-to pattern;Step-through pattern;Decreased step length - right;Decreased step length - left;Shuffle;Wide base of support Gait velocity: decr   General Gait Details: Cues for posture, position from RW, safety awareness and sequence with pt agreeable to all cues but with limited follow through.  Pt contnues struggling with concept of stepping back to bed   Stairs             Wheelchair Mobility    Modified Rankin (Stroke Patients Only)       Balance Overall balance assessment: Needs assistance;History of Falls Sitting-balance support: Feet supported Sitting balance-Leahy Scale: Fair     Standing balance support: Bilateral upper extremity supported Standing balance-Leahy Scale: Poor Standing balance comment: reliant on external assist + UE support                            Cognition Arousal/Alertness: Awake/alert Behavior During Therapy: WFL for tasks assessed/performed Overall Cognitive Status: Impaired/Different from baseline Area of Impairment: Attention;Following commands;Safety/judgement;Problem solving                   Current Attention Level: Focused   Following Commands: Follows one step commands inconsistently Safety/Judgement: Decreased awareness of safety;Decreased awareness of deficits   Problem Solving: Slow processing;Difficulty sequencing;Requires verbal cues;Requires tactile cues;Decreased initiation General Comments: needing max cues for carry over of multimodal cues during mobility, easily distracted      Exercises      General Comments  Pertinent Vitals/Pain Pain Assessment: Faces Faces Pain Scale: Hurts little more Pain Location: L hip Pain Descriptors / Indicators:  Guarding;Grimacing Pain Intervention(s): Limited activity within patient's tolerance;Monitored during session    Home Living                      Prior Function            PT Goals (current goals can now be found in the care plan section) Acute Rehab PT Goals Patient Stated Goal: go to rehab PT Goal Formulation: With patient/family Time For Goal Achievement: 03/07/21 Potential to Achieve Goals: Good Progress towards PT goals: Progressing toward goals    Frequency    Min 5X/week      PT Plan Current plan remains appropriate    Co-evaluation              AM-PAC PT "6 Clicks" Mobility   Outcome Measure  Help needed turning from your back to your side while in a flat bed without using bedrails?: A Lot Help needed moving from lying on your back to sitting on the side of a flat bed without using bedrails?: A Lot Help needed moving to and from a bed to a chair (including a wheelchair)?: A Lot Help needed standing up from a chair using your arms (e.g., wheelchair or bedside chair)?: A Lot Help needed to walk in hospital room?: A Little Help needed climbing 3-5 steps with a railing? : A Lot 6 Click Score: 13    End of Session Equipment Utilized During Treatment: Gait belt Activity Tolerance: Patient tolerated treatment well Patient left: in bed;with call bell/phone within reach;with bed alarm set;with nursing/sitter in room Nurse Communication: Mobility status PT Visit Diagnosis: Difficulty in walking, not elsewhere classified (R26.2);Unsteadiness on feet (R26.81)     Time: 1537-9432 PT Time Calculation (min) (ACUTE ONLY): 8 min  Charges:  $Gait Training: 8-22 mins $Therapeutic Activity: 8-22 mins                     Debe Coder PT Acute Rehabilitation Services Pager [phone] Office [phone]    Norena Bratton 03/01/2021, 1:57 PM

## 2021-03-02 LAB — BASIC METABOLIC PANEL
Anion gap: 5 (ref 5–15)
BUN: 8 mg/dL (ref 8–23)
CO2: 29 mmol/L (ref 22–32)
Calcium: 8.4 mg/dL — ABNORMAL LOW (ref 8.9–10.3)
Chloride: 105 mmol/L (ref 98–111)
Creatinine, Ser: 0.42 mg/dL — ABNORMAL LOW (ref 0.44–1.00)
GFR, Estimated: >60 mL/min (ref >60)
Glucose, Bld: 116 mg/dL — ABNORMAL HIGH (ref 70–99)
Potassium: 3.9 mmol/L (ref 3.5–5.1)
Sodium: 139 mmol/L (ref 135–145)

## 2021-03-02 LAB — CBC
HCT: 27.9 % — ABNORMAL LOW (ref 36.0–46.0)
Hemoglobin: 9.6 g/dL — ABNORMAL LOW (ref 12.0–15.0)
MCH: 33.9 pg (ref 26.0–34.0)
MCHC: 34.4 g/dL (ref 30.0–36.0)
MCV: 98.6 fL (ref 80.0–100.0)
Platelets: 270 10*3/uL (ref 150–400)
RBC: 2.83 MIL/uL — ABNORMAL LOW (ref 3.87–5.11)
RDW: 13.4 % (ref 11.5–15.5)
WBC: 14.3 10*3/uL — ABNORMAL HIGH (ref 4.0–10.5)
nRBC: 0 % (ref 0.0–0.2)

## 2021-03-02 NOTE — Progress Notes (Addendum)
PROGRESS NOTE  Darlene Marsh YDX:[id number] DOB: January 12, 1951 DOA: 02/27/2021 PCP: Pcp, No   LOS: 3 days   Brief Narrative / Interim history: Darlene Marsh is a 70 y.o. 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  Now on Temodar consolidation chemo.  Also seizure disorder on keppra. Pt presents to ED with c/o L hip pain following mechanical fall at home.  She was found to have a left femoral neck fracture, orthopedic surgery consulted  Subjective / 24h Interval events: Feels well this morning, pain is controlled.  Worked with PT yesterday, pain tolerable  Assessment & Plan: Principal Problem Left femoral neck fracture-orthopedic surgery consulted and followed patient while hospitalized.  She was taken to the operating room on 9/29 and she is status post total hip arthroplasty by Dr. Lyla Glassing. -DVT prophylaxis per orthopedic surgery with full dose aspirin -PT recommends SNF, family agreeable. Placement pending  Active Problems GBM, seizure disorder-follows with Dr. Mickeal Skinner, continue Decadron, Keppra  Hypothyroidism-does not appear to be on thyroid meds, TSH normal.  Outpatient follow-up  Acute blood loss anemia-postoperatively, hemoglobin stable today, no need for transfusions  Hypokalemia-improved with repletion  Leukocytosis-monitor, afebrile, overall stable, likely reactive/steroids   Scheduled Meds:  aspirin EC  325 mg Oral Q breakfast   dexamethasone  1 mg Oral Daily   docusate sodium  100 mg Oral BID   feeding supplement  1 Container Oral BID BM   levETIRAcetam  1,000 mg Oral BID   senna  1 tablet Oral BID   Continuous Infusions:  methocarbamol (ROBAXIN) IV     PRN Meds:.acetaminophen, HYDROcodone-acetaminophen, HYDROcodone-acetaminophen, hydrOXYzine, menthol-cetylpyridinium **OR** phenol, methocarbamol **OR** methocarbamol (ROBAXIN) IV, metoCLOPramide **OR** metoCLOPramide (REGLAN) injection, morphine injection,  ondansetron **OR** ondansetron (ZOFRAN) IV  Diet Orders (From admission, onward)     Start     Ordered   02/27/21 2042  Diet regular Room service appropriate? Yes; Fluid consistency: Thin  Diet effective now       Question Answer Comment  Room service appropriate? Yes   Fluid consistency: Thin      02/27/21 2041            DVT prophylaxis: SCDs Start: 02/27/21 2042     Code Status: Full Code  Family Communication: Husband present at the bedside  Status is: Inpatient  Remains inpatient appropriate because:Inpatient level of care appropriate due to severity of illness  Dispo: The patient is from: Home              Anticipated d/c is to: SNF              Patient currently is medically stable to d/c.   Difficult to place patient No  Level of care: Med-Surg  Consultants:  Orthopedic surgery   Procedures:  none  Microbiology  none  Antimicrobials: none    Objective: Vitals:   03/01/21 0419 03/01/21 1338 03/01/21 2101 03/02/21 0418  BP: 135/73 (!) 159/82 130/78 129/78  Pulse: (!) 110 98 98 91  Resp: 16 16 16 16   Temp: 99 F (37.2 C) 98.2 F (36.8 C) 98.9 F (37.2 C) 98.6 F (37 C)  TempSrc: Oral  Oral Oral  SpO2: 96% 99% 98% 99%  Weight:      Height:        Intake/Output Summary (Last 24 hours) at 03/02/2021 1114 Last data filed at 03/02/2021 0900 Gross per 24 hour  Intake 360 ml  Output 151 ml  Net 209 ml  Filed Weights   02/27/21 0339 02/27/21 1333  Weight: 61.2 kg 61.2 kg    Examination:  Constitutional: NAD Eyes: lids and conjunctivae normal, no scleral icterus ENMT: mmm Neck: normal, supple Respiratory: clear to auscultation bilaterally, no wheezing, no crackles.  Cardiovascular: Regular rate and rhythm, no murmurs / rubs / gallops. No LE edema. Abdomen: soft, no distention, no tenderness. Bowel sounds positive.  Skin: no rashes Neurologic: no focal deficits, equal strength  Data Reviewed: I have independently reviewed following  labs and imaging studies   CBC: Recent Labs  Lab 02/27/21 0411 02/28/21 0307 03/01/21 0321 03/02/21 0337  WBC 13.8* 17.4* 14.0* 14.3*  NEUTROABS 10.8*  --   --   --   HGB 11.5* 10.6* 9.6* 9.6*  HCT 34.0* 31.1* 28.1* 27.9*  MCV 100.0 98.4 100.4* 98.6  PLT 307 294 279 419    Basic Metabolic Panel: Recent Labs  Lab 02/27/21 0411 02/28/21 0307 03/01/21 0321 03/02/21 0337  NA 144 138 145 139  K 3.2* 4.3 2.7* 3.9  CL 106 105 107 105  CO2 30 26 29 29   GLUCOSE 108* 144* 99 116*  BUN 9 10 10 8   CREATININE 0.56 0.53 0.59 0.42*  CALCIUM 8.7* 8.5* 8.3* 8.4*    Liver Function Tests: Recent Labs  Lab 02/27/21 0411 02/28/21 0307  AST 16 19  ALT 18 22  ALKPHOS 83 67  BILITOT 0.9 0.8  PROT 6.3* 5.6*  ALBUMIN 3.5 2.8*    Coagulation Profile: No results for input(s): INR, PROTIME in the last 168 hours. HbA1C: No results for input(s): HGBA1C in the last 72 hours. CBG: No results for input(s): GLUCAP in the last 168 hours.  Recent Results (from the past 240 hour(s))  Surgical pcr screen     Status: None   Collection Time: 02/27/21  9:05 AM   Specimen: Nasal Mucosa; Nasal Swab  Result Value Ref Range Status   MRSA, PCR NEGATIVE NEGATIVE Final   Staphylococcus aureus NEGATIVE NEGATIVE Final    Comment: (NOTE) The Xpert SA Assay (FDA approved for NASAL specimens in patients 55 years of age and older), is one component of a comprehensive surveillance program. It is not intended to diagnose infection nor to guide or monitor treatment. Performed at [facility], [place] [address] [place], [place] [address]   SARS Coronavirus 2 by RT PCR (hospital order, performed in [facility] hospital lab) Nasopharyngeal Nasopharyngeal Swab     Status: None   Collection Time: 02/27/21 10:55 AM   Specimen: Nasopharyngeal Swab  Result Value Ref Range Status   SARS Coronavirus 2 NEGATIVE NEGATIVE Final    Comment: (NOTE) SARS-CoV-2 target nucleic acids are NOT  DETECTED.  The SARS-CoV-2 RNA is generally detectable in upper and lower respiratory specimens during the acute phase of infection. The lowest concentration of SARS-CoV-2 viral copies this assay can detect is 250 copies / mL. A negative result does not preclude SARS-CoV-2 infection and should not be used as the sole basis for treatment or other patient management decisions.  A negative result may occur with improper specimen collection / handling, submission of specimen other than nasopharyngeal swab, presence of viral mutation(s) within the areas targeted by this assay, and inadequate number of viral copies (<250 copies / mL). A negative result must be combined with clinical observations, patient history, and epidemiological information.  Fact Sheet for Patients:   StrictlyIdeas.no  Fact Sheet for Healthcare Providers: BankingDealers.co.za  This test is not yet approved or  cleared  by the [place] and has been authorized for detection and/or diagnosis of SARS-CoV-2 by FDA under an Emergency Use Authorization (EUA).  This EUA will remain in effect (meaning this test can be used) for the duration of the COVID-19 declaration under Section 564(b)(1) of the Act, 21 U.S.C. section 360bbb-3(b)(1), unless the authorization is terminated or revoked sooner.  Performed at [facility], [place] [address], [place], [place] [address]      Radiology Studies: No results found.   Marzetta Board, MD, PhD [facility]  Between 7 am - 7 pm I am available, please contact me via Amion (for emergencies) or Securechat (non urgent messages)  Between 7 pm - 7 am I am not available, please contact night coverage MD/APP via Amion

## 2021-03-02 NOTE — TOC Progression Note (Signed)
Transition of Care [facility]) - Progression Note    Patient Details  Name: Darlene Marsh MRN: [id number] Date of Birth: [birth year]  [facility] CM/SW Contact  Reyn Faivre, Marta Lamas, [place] Phone Number: 03/02/2021, 12:37 PM  Clinical Narrative:     Collaboration with Attending Physician, Dr. Marzetta Board, who reported that patient is medically stable and ready for discharge today.  FL-2 Form faxed out and bed offer received from [facility] and [facility].  CSW contacted Star, Admissions Coordinator at [facility] [phone]) to inquire about bed availability; however, she was unavailable at the time of CSW's call.  HIPAA compliant message left on voicemail, as CSW awaits a return call.  Expected Discharge Plan: [place] Barriers to Discharge: Continued Medical Work up, SNF Pending bed offer  Expected Discharge Plan and Services Expected Discharge Plan: [place] arrangements for the past 2 months: Single Family Home Expected Discharge Date: 03/01/21               DME Arranged: N/A DME Agency: NA  Social Determinants of Health (SDOH) Interventions  N/A  Readmission Risk Interventions No flowsheet data found.  Nat Christen, BSW, MSW, [employer]  Licensed Holiday representative [employer]  Mailing Address-[address], [place], [place] [address] Physical Address-[address], [place], [place] [address] Toll Free Main # [phone] Fax # [phone] Cell # [phone]  [email or web address]

## 2021-03-02 NOTE — Progress Notes (Signed)
Physical Therapy Treatment Patient Details Name: Darlene Marsh MRN: [id number] DOB: April 08, 1951 Today's Date: 03/02/2021   History of Present Illness Patient is a 70 year old female with fall at home resulting in L hip fracture. S/p L THA, anterior approach on 9/29. PMH includes GBM diagnosed earlier this year, s/p craniotomy and radiation.  Now on Temodar consolidation chemo, seizure disorder on keppra.    PT Comments    Pt continues very cooperative  and agreeable to all requests/cues but with limited follow through and requiring increased time and repetition of multi-modal cues for completion of tasks  Recommendations for follow up therapy are one component of a multi-disciplinary discharge planning process, led by the attending physician.  Recommendations may be updated based on patient status, additional functional criteria and insurance authorization.  Follow Up Recommendations  SNF     Equipment Recommendations  None recommended by PT    Recommendations for Other Services       Precautions / Restrictions Precautions Precautions: Fall Restrictions Weight Bearing Restrictions: No LLE Weight Bearing: Weight bearing as tolerated     Mobility  Bed Mobility Overal bed mobility: Needs Assistance Bed Mobility: Supine to Sit     Supine to sit: Mod assist;+2 for physical assistance;HOB elevated     General bed mobility comments: has difficulty following multimodal cues, needing mod A x2 for leg management and elevating trunk    Transfers Overall transfer level: Needs assistance Equipment used: Rolling walker (2 wheeled) Transfers: Sit to/from [facility] Sit to Stand: Min assist;+2 safety/equipment         General transfer comment: multimodal cues for LE management and use of UEs to self assist  Ambulation/Gait Ambulation/Gait assistance: Min assist;Mod assist Gait Distance (Feet): 110 Feet Assistive device: Rolling walker (2 wheeled) Gait  Pattern/deviations: Step-to pattern;Step-through pattern;Decreased step length - right;Decreased step length - left;Shuffle;Wide base of support Gait velocity: decr   General Gait Details: Cues for posture, position from RW, safety awareness and sequence with pt agreeable to all cues but with limited follow through.  Pt contnues struggling with concept of stepping backward to chair   Stairs             Wheelchair Mobility    Modified Rankin (Stroke Patients Only)       Balance Overall balance assessment: Needs assistance;History of Falls Sitting-balance support: Feet supported Sitting balance-Leahy Scale: Fair     Standing balance support: Bilateral upper extremity supported Standing balance-Leahy Scale: Poor Standing balance comment: reliant on external assist + UE support                            Cognition Arousal/Alertness: Awake/alert Behavior During Therapy: WFL for tasks assessed/performed Overall Cognitive Status: Impaired/Different from baseline Area of Impairment: Attention;Following commands;Safety/judgement;Problem solving                   Current Attention Level: Focused   Following Commands: Follows one step commands inconsistently Safety/Judgement: Decreased awareness of safety;Decreased awareness of deficits   Problem Solving: Slow processing;Difficulty sequencing;Requires verbal cues;Requires tactile cues;Decreased initiation General Comments: needing max cues for carry over of multimodal cues during mobility, easily distracted      Exercises      General Comments        Pertinent Vitals/Pain Pain Assessment: Faces Faces Pain Scale: Hurts little more Pain Location: L hip Pain Descriptors / Indicators: Guarding;Grimacing Pain Intervention(s): Limited activity within patient's tolerance;Monitored during session;Premedicated before  session    Home Living                      Prior Function            PT  Goals (current goals can now be found in the care plan section) Acute Rehab PT Goals Patient Stated Goal: go to rehab PT Goal Formulation: With patient/family Time For Goal Achievement: 03/07/21 Potential to Achieve Goals: Good Progress towards PT goals: Progressing toward goals    Frequency    Min 5X/week      PT Plan Current plan remains appropriate    Co-evaluation              AM-PAC PT "6 Clicks" Mobility   Outcome Measure  Help needed turning from your back to your side while in a flat bed without using bedrails?: A Lot Help needed moving from lying on your back to sitting on the side of a flat bed without using bedrails?: A Lot Help needed moving to and from a bed to a chair (including a wheelchair)?: A Lot Help needed standing up from a chair using your arms (e.g., wheelchair or bedside chair)?: A Lot Help needed to walk in hospital room?: A Little Help needed climbing 3-5 steps with a railing? : A Lot 6 Click Score: 13    End of Session Equipment Utilized During Treatment: Gait belt Activity Tolerance: Patient tolerated treatment well Patient left: in chair;with chair alarm set;with call bell/phone within reach Nurse Communication: Mobility status PT Visit Diagnosis: Difficulty in walking, not elsewhere classified (R26.2);Unsteadiness on feet (R26.81)     Time: 5409-8119 PT Time Calculation (min) (ACUTE ONLY): 21 min  Charges:  $Gait Training: 8-22 mins                     [place] Pager [phone] Office [phone]    Samanthan Dugo 03/02/2021, 12:46 PM

## 2021-03-03 ENCOUNTER — Other Ambulatory Visit (HOSPITAL_COMMUNITY): Payer: Self-pay

## 2021-03-03 ENCOUNTER — Ambulatory Visit: Payer: Medicare Other | Admitting: Internal Medicine

## 2021-03-03 ENCOUNTER — Other Ambulatory Visit: Payer: Medicare Other

## 2021-03-03 LAB — RESP PANEL BY RT-PCR (FLU A&B, COVID) ARPGX2
Influenza A by PCR: NEGATIVE
Influenza B by PCR: NEGATIVE
SARS Coronavirus 2 by RT PCR: NEGATIVE

## 2021-03-03 NOTE — TOC Transition Note (Signed)
Transition of Care [facility]) - CM/SW Discharge Note   Patient Details  Name: Darlene Marsh MRN: [id number] Date of Birth: 04/18/1951  [facility] CM/SW Contact:  Lennart Pall, LCSW Phone Number: 03/03/2021, 1:20 PM   Clinical Narrative:    Pt medically cleared for dc today to SNF and bed accepted at [facility].  Pt and family aware and agreeable. PTAR called at 1:15pm.  RN to call report to [phone].  No further TOC needs.   Final next level of care: Skilled Nursing Facility Barriers to Discharge: Barriers Resolved   Patient Goals and CMS Choice Patient states their goals for this hospitalization and ongoing recovery are:: to eventually return home      Discharge Placement PASRR number recieved: 03/01/21            Patient chooses bed at: [facility] Patient to be transferred to facility by: [place] Name of family member notified: daughter Patient and family notified of of transfer: 03/03/21  Discharge Plan and Services                DME Arranged: N/A DME Agency: NA                  Social Determinants of Health ([place]) Interventions     Readmission Risk Interventions No flowsheet data found.

## 2021-03-03 NOTE — Discharge Summary (Signed)
Physician Discharge Summary  Darlene Marsh WNU:[id number] DOB: [birth year] DOA: 02/27/2021  PCP: Merryl Hacker, No  Admit date: 02/27/2021 Discharge date: 03/03/2021  Admitted From: home Disposition:  SNF  Recommendations for Outpatient Follow-up:  Follow up with PCP in 1-2 weeks Follow-up with Dr. Lyla Glassing as scheduled Follow-up with Dr. Mickeal Skinner as scheduled  Home Health: none Equipment/Devices: none  Discharge Condition: stable CODE STATUS: Full code Diet recommendation: regular  HPI: Per admitting MD, Darlene Marsh is a 70 y.o. 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  Now on Temodar consolidation chemo.  Also seizure disorder on keppra. Pt presents to ED with c/o L hip pain following mechanical fall at home.  Pt attempted to sit on side of tub but slipped and fell on floor.  No LOC, hip pain is severe, nothing makes better, movement makes worse. No fevers, chills.  Hospital Course / Discharge diagnoses: Principal Problem Left femoral neck fracture-orthopedic surgery consulted and followed patient while hospitalized.  She was taken to the operating room on 9/29 and she is status post total hip arthroplasty by Dr. Lyla Glassing. DVT prophylaxis per orthopedic surgery with full dose aspirin. PT recommends SNF   Active Problems GBM, seizure disorder-follows with Dr. Mickeal Skinner, continue Decadron, Keppra  Hypothyroidism-does not appear to be on thyroid meds, TSH normal.  Outpatient follow-up Acute blood loss anemia-postoperatively, hemoglobin stable, no need for transfusions Hypokalemia-improved with repletion Leukocytosis-monitor, afebrile, overall stable, likely reactive/steroids  Sepsis ruled out   Discharge Instructions   Allergies as of 03/03/2021   No Known Allergies      Medication List     TAKE these medications    aspirin 81 MG chewable tablet Commonly known as: Aspirin Childrens Chew 1 tablet (81 mg total) by  mouth 2 (two) times daily with a meal.   dexamethasone 1 MG tablet Commonly known as: DECADRON Take 1 mg by mouth daily.   furosemide 20 MG tablet Commonly known as: LASIX Take 1 tablet (20 mg total) by mouth daily.   HYDROcodone-acetaminophen 5-325 MG tablet Commonly known as: NORCO/VICODIN Take 1 tablet by mouth every 4 (four) hours as needed for moderate pain (pain score 4-6).   levETIRAcetam 1000 MG tablet Commonly known as: KEPPRA Take 1 tablet (1,000 mg total) by mouth 2 (two) times daily.   ondansetron 8 MG tablet Commonly known as: Zofran Take 1 tablet (8 mg total) by mouth 2 (two) times daily as needed (nausea and vomiting). May take 30-60 minutes prior to Temodar administration if nausea/vomiting occurs.   temozolomide 20 MG capsule Commonly known as: TEMODAR Take 1 capsule (20 mg total) by mouth daily. May take on an empty stomach to decrease nausea & vomiting.   temozolomide 100 MG capsule Commonly known as: TEMODAR Take 2 capsules (200 mg total) by mouth daily. May take on an empty stomach to decrease nausea & vomiting.        Follow-up Information     Swinteck, Aaron Edelman, MD. Schedule an appointment as soon as possible for a visit in 2 week(s).   Specialty: Orthopedic Surgery Why: For wound re-check, For suture removal Contact information: [address] [place] [address] [phone]                 Consultations: Orthopedic surgery   Procedures/Studies: Hip fx repair  Pelvis Portable  Result Date: 02/27/2021 CLINICAL DATA:  Left hip replacement EXAM: PORTABLE PELVIS 1-2 VIEWS COMPARISON:  02/27/2021 FINDINGS: Gas within the soft  tissues consistent with recent surgery. Interval left hip replacement with intact hardware and normal alignment IMPRESSION: Interval left hip replacement with expected surgical change Electronically Signed   By: Donavan Foil M.D.   On: 02/27/2021 20:15   DG Chest Portable 1 View  Result Date:  02/27/2021 CLINICAL DATA:  70 year old female with acute left femur fracture. EXAM: PORTABLE CHEST 1 VIEW COMPARISON:  Portable chest 11/27/2020. FINDINGS: Portable AP supine view at 0517 hours. Mildly lower lung volumes. Mediastinal contours remain within normal limits. Mild additional crowding of lung markings but otherwise Allowing for portable technique the lungs are clear. No pneumothorax or pleural effusion identified on this supine view. Visible osseous structures appear intact. Paucity of bowel gas in the upper abdomen. IMPRESSION: Lower lung volumes.  No acute cardiopulmonary abnormality. Electronically Signed   By: Genevie Ann M.D.   On: 02/27/2021 05:26   DG Knee Left Port  Result Date: 02/27/2021 CLINICAL DATA:  70 year old female with pain. Decreased range of motion. Left femoral neck fracture. EXAM: PORTABLE LEFT KNEE - 1-2 VIEW COMPARISON:  Left hip series earlier today. FINDINGS: 2 portable views of the left knee. Both the AP and lateral are oblique. Knee joint alignment is maintained. There is mild degenerative spurring. No joint effusion is evident. No acute osseous abnormality identified. IMPRESSION: No acute osseous abnormality identified about the left knee. Electronically Signed   By: Genevie Ann M.D.   On: 02/27/2021 11:26   DG C-Arm 1-60 Min-No Report  Result Date: 02/27/2021 Fluoroscopy was utilized by the requesting physician.  No radiographic interpretation.   DG HIP OPERATIVE UNILAT W OR W/O PELVIS LEFT  Result Date: 02/27/2021 CLINICAL DATA:  Hip surgery EXAM: OPERATIVE left HIP (WITH PELVIS IF PERFORMED) 2 VIEWS TECHNIQUE: Fluoroscopic spot image(s) were submitted for interpretation post-operatively. COMPARISON:  02/27/2021 FINDINGS: Two low resolution intraoperative spot views of the left hip. Total fluoroscopy time was 11 seconds. The images demonstrate a left hip replacement with intact hardware and normal alignment IMPRESSION: Intraoperative fluoroscopic assistance provided  during left hip replacement Electronically Signed   By: Donavan Foil M.D.   On: 02/27/2021 20:15   DG Hip Unilat W or Wo Pelvis 2-3 Views Left  Result Date: 02/27/2021 CLINICAL DATA:  Fall at home with left hip pain EXAM: DG HIP (WITH OR WITHOUT PELVIS) 3V LEFT COMPARISON:  None. FINDINGS: Acute left femoral neck fracture with varus angulation due to displacement. Unremarkable acetabula. IMPRESSION: Displaced left femoral neck fracture. Electronically Signed   By: Jorje Guild M.D.   On: 02/27/2021 05:07     Subjective: - no chest pain, shortness of breath, no abdominal pain, nausea or vomiting.   Discharge Exam: BP 126/75 (BP Location: Right Arm)   Pulse 82   Temp 98.2 F (36.8 C) (Oral)   Resp 18   Ht 5\' 6"  (1.676 m)   Wt 61.2 kg   SpO2 100%   BMI 21.78 kg/m   General: Pt is alert, awake, not in acute distress Cardiovascular: RRR, S1/S2 +, no rubs, no gallops Respiratory: CTA bilaterally, no wheezing, no rhonchi Abdominal: Soft, NT, ND, bowel sounds + Extremities: no edema, no cyanosis  The results of significant diagnostics from this hospitalization (including imaging, microbiology, ancillary and laboratory) are listed below for reference.     Microbiology: Recent Results (from the past 240 hour(s))  Surgical pcr screen     Status: None   Collection Time: 02/27/21  9:05 AM   Specimen: Nasal Mucosa; Nasal Swab  Result  Value Ref Range Status   MRSA, PCR NEGATIVE NEGATIVE Final   Staphylococcus aureus NEGATIVE NEGATIVE Final    Comment: (NOTE) The Xpert SA Assay (FDA approved for NASAL specimens in patients 3 years of age and older), is one component of a comprehensive surveillance program. It is not intended to diagnose infection nor to guide or monitor treatment. Performed at [facility], [place] [address], [place], [place] [address]   SARS Coronavirus 2 by RT PCR (hospital order, performed in [facility] hospital lab) Nasopharyngeal  Nasopharyngeal Swab     Status: None   Collection Time: 02/27/21 10:55 AM   Specimen: Nasopharyngeal Swab  Result Value Ref Range Status   SARS Coronavirus 2 NEGATIVE NEGATIVE Final    Comment: (NOTE) SARS-CoV-2 target nucleic acids are NOT DETECTED.  The SARS-CoV-2 RNA is generally detectable in upper and lower respiratory specimens during the acute phase of infection. The lowest concentration of SARS-CoV-2 viral copies this assay can detect is 250 copies / mL. A negative result does not preclude SARS-CoV-2 infection and should not be used as the sole basis for treatment or other patient management decisions.  A negative result may occur with improper specimen collection / handling, submission of specimen other than nasopharyngeal swab, presence of viral mutation(s) within the areas targeted by this assay, and inadequate number of viral copies (<250 copies / mL). A negative result must be combined with clinical observations, patient history, and epidemiological information.  Fact Sheet for Patients:   StrictlyIdeas.no  Fact Sheet for Healthcare Providers: BankingDealers.co.za  This test is not yet approved or  cleared by the [place] FDA and has been authorized for detection and/or diagnosis of SARS-CoV-2 by FDA under an Emergency Use Authorization (EUA).  This EUA will remain in effect (meaning this test can be used) for the duration of the COVID-19 declaration under Section 564(b)(1) of the Act, 21 U.S.C. section 360bbb-3(b)(1), unless the authorization is terminated or revoked sooner.  Performed at [facility], [place] [address], [place], [place] [address]      Labs: Basic Metabolic Panel: Recent Labs  Lab 02/27/21 0411 02/28/21 0307 03/01/21 0321 03/02/21 0337  NA 144 138 145 139  K 3.2* 4.3 2.7* 3.9  CL 106 105 107 105  CO2 30 26 29 29   GLUCOSE 108* 144* 99 116*  BUN 9 10 10 8   CREATININE  0.56 0.53 0.59 0.42*  CALCIUM 8.7* 8.5* 8.3* 8.4*   Liver Function Tests: Recent Labs  Lab 02/27/21 0411 02/28/21 0307  AST 16 19  ALT 18 22  ALKPHOS 83 67  BILITOT 0.9 0.8  PROT 6.3* 5.6*  ALBUMIN 3.5 2.8*   CBC: Recent Labs  Lab 02/27/21 0411 02/28/21 0307 03/01/21 0321 03/02/21 0337  WBC 13.8* 17.4* 14.0* 14.3*  NEUTROABS 10.8*  --   --   --   HGB 11.5* 10.6* 9.6* 9.6*  HCT 34.0* 31.1* 28.1* 27.9*  MCV 100.0 98.4 100.4* 98.6  PLT 307 294 279 270   CBG: No results for input(s): GLUCAP in the last 168 hours. Hgb A1c No results for input(s): HGBA1C in the last 72 hours. Lipid Profile No results for input(s): CHOL, HDL, LDLCALC, TRIG, CHOLHDL, LDLDIRECT in the last 72 hours. Thyroid function studies No results for input(s): TSH, T4TOTAL, T3FREE, THYROIDAB in the last 72 hours.  Invalid input(s): FREET3 Urinalysis    Component Value Date/Time   COLORURINE STRAW (A) 01/02/2021 [place] 01/02/2021 1510   LABSPEC  1.005 01/02/2021 1510   PHURINE 8.0 01/02/2021 1510   GLUCOSEU NEGATIVE 01/02/2021 1510   HGBUR NEGATIVE 01/02/2021 1510   BILIRUBINUR NEGATIVE 01/02/2021 1510   KETONESUR NEGATIVE 01/02/2021 1510   PROTEINUR NEGATIVE 01/02/2021 1510   NITRITE NEGATIVE 01/02/2021 1510   LEUKOCYTESUR NEGATIVE 01/02/2021 1510    FURTHER DISCHARGE INSTRUCTIONS:   Get Medicines reviewed and adjusted: Please take all your medications with you for your next visit with your Primary MD   Laboratory/radiological data: Please request your Primary MD to go over all hospital tests and procedure/radiological results at the follow up, please ask your Primary MD to get all Hospital records sent to his/her office.   In some cases, they will be blood work, cultures and biopsy results pending at the time of your discharge. Please request that your primary care M.D. goes through all the records of your hospital data and follows up on these results.   Also Note the  following: If you experience worsening of your admission symptoms, develop shortness of breath, life threatening emergency, suicidal or homicidal thoughts you must seek medical attention immediately by calling 911 or calling your MD immediately  if symptoms less severe.   You must read complete instructions/literature along with all the possible adverse reactions/side effects for all the Medicines you take and that have been prescribed to you. Take any new Medicines after you have completely understood and accpet all the possible adverse reactions/side effects.    Do not drive when taking Pain medications or sleeping medications (Benzodaizepines)   Do not take more than prescribed Pain, Sleep and Anxiety Medications. It is not advisable to combine anxiety,sleep and pain medications without talking with your primary care practitioner   Special Instructions: If you have smoked or chewed Tobacco  in the last 2 yrs please stop smoking, stop any regular Alcohol  and or any Recreational drug use.   Wear Seat belts while driving.   Please note: You were cared for by a hospitalist during your hospital stay. Once you are discharged, your primary care physician will handle any further medical issues. Please note that NO REFILLS for any discharge medications will be authorized once you are discharged, as it is imperative that you return to your primary care physician (or establish a relationship with a primary care physician if you do not have one) for your post hospital discharge needs so that they can reassess your need for medications and monitor your lab values.  Time coordinating discharge: 40 minutes  SIGNED:  Marzetta Board, MD, PhD 03/03/2021, 9:36 AM

## 2021-03-03 NOTE — Plan of Care (Signed)
  Problem: Health Behavior/Discharge Planning: Goal: Ability to manage health-related needs will improve Outcome: Progressing   Problem: Clinical Measurements: Goal: Ability to maintain clinical measurements within normal limits will improve Outcome: Progressing Goal: Will remain free from infection Outcome: Progressing Goal: Diagnostic test results will improve Outcome: Progressing Goal: Respiratory complications will improve Outcome: Progressing Goal: Cardiovascular complication will be avoided Outcome: Progressing   Problem: Activity: Goal: Risk for activity intolerance will decrease Outcome: Progressing   Problem: Nutrition: Goal: Adequate nutrition will be maintained Outcome: Progressing   Problem: Coping: Goal: Level of anxiety will decrease Outcome: Progressing   Problem: Elimination: Goal: Will not experience complications related to bowel motility Outcome: Progressing   Problem: Pain Managment: Goal: General experience of comfort will improve Outcome: Progressing   Problem: Safety: Goal: Ability to remain free from injury will improve Outcome: Progressing   Problem: Education: Goal: Verbalization of understanding the information provided (i.e., activity precautions, restrictions, etc) will improve Outcome: Progressing   Problem: Activity: Goal: Ability to ambulate and perform ADLs will improve Outcome: Progressing   Problem: Clinical Measurements: Goal: Postoperative complications will be avoided or minimized Outcome: Progressing   Problem: Self-Concept: Goal: Ability to maintain and perform role responsibilities to the fullest extent possible will improve Outcome: Progressing   Problem: Pain Management: Goal: Pain level will decrease Outcome: Progressing   Problem: Education: Goal: Knowledge of the prescribed therapeutic regimen will improve Outcome: Progressing Goal: Understanding of discharge needs will improve Outcome: Progressing Goal:  Individualized Educational Video(s) Outcome: Progressing   Problem: Activity: Goal: Ability to avoid complications of mobility impairment will improve Outcome: Progressing Goal: Ability to tolerate increased activity will improve Outcome: Progressing   Problem: Clinical Measurements: Goal: Postoperative complications will be avoided or minimized Outcome: Progressing   Problem: Pain Management: Goal: Pain level will decrease with appropriate interventions Outcome: Progressing   Problem: Skin Integrity: Goal: Will show signs of wound healing Outcome: Progressing

## 2021-03-04 DIAGNOSIS — E782 Mixed hyperlipidemia: Secondary | ICD-10-CM | POA: Diagnosis not present

## 2021-03-04 DIAGNOSIS — G4700 Insomnia, unspecified: Secondary | ICD-10-CM | POA: Diagnosis not present

## 2021-03-04 DIAGNOSIS — Z85841 Personal history of malignant neoplasm of brain: Secondary | ICD-10-CM | POA: Diagnosis not present

## 2021-03-04 DIAGNOSIS — R278 Other lack of coordination: Secondary | ICD-10-CM | POA: Diagnosis not present

## 2021-03-04 DIAGNOSIS — Z4789 Encounter for other orthopedic aftercare: Secondary | ICD-10-CM | POA: Diagnosis not present

## 2021-03-04 DIAGNOSIS — Z471 Aftercare following joint replacement surgery: Secondary | ICD-10-CM | POA: Diagnosis not present

## 2021-03-04 DIAGNOSIS — Z96642 Presence of left artificial hip joint: Secondary | ICD-10-CM | POA: Diagnosis not present

## 2021-03-04 DIAGNOSIS — R2689 Other abnormalities of gait and mobility: Secondary | ICD-10-CM | POA: Diagnosis not present

## 2021-03-04 DIAGNOSIS — I10 Essential (primary) hypertension: Secondary | ICD-10-CM | POA: Diagnosis not present

## 2021-03-04 DIAGNOSIS — G40909 Epilepsy, unspecified, not intractable, without status epilepticus: Secondary | ICD-10-CM | POA: Diagnosis not present

## 2021-03-04 DIAGNOSIS — M6281 Muscle weakness (generalized): Secondary | ICD-10-CM | POA: Diagnosis not present

## 2021-03-04 DIAGNOSIS — R296 Repeated falls: Secondary | ICD-10-CM | POA: Diagnosis not present

## 2021-03-04 DIAGNOSIS — R220 Localized swelling, mass and lump, head: Secondary | ICD-10-CM | POA: Diagnosis not present

## 2021-03-04 DIAGNOSIS — G40509 Epileptic seizures related to external causes, not intractable, without status epilepticus: Secondary | ICD-10-CM | POA: Diagnosis not present

## 2021-03-04 DIAGNOSIS — R41841 Cognitive communication deficit: Secondary | ICD-10-CM | POA: Diagnosis not present

## 2021-03-04 DIAGNOSIS — R569 Unspecified convulsions: Secondary | ICD-10-CM | POA: Diagnosis not present

## 2021-03-04 DIAGNOSIS — M24562 Contracture, left knee: Secondary | ICD-10-CM | POA: Diagnosis not present

## 2021-03-04 DIAGNOSIS — R5381 Other malaise: Secondary | ICD-10-CM | POA: Diagnosis not present

## 2021-03-04 DIAGNOSIS — C719 Malignant neoplasm of brain, unspecified: Secondary | ICD-10-CM | POA: Diagnosis not present

## 2021-03-04 DIAGNOSIS — S72002D Fracture of unspecified part of neck of left femur, subsequent encounter for closed fracture with routine healing: Secondary | ICD-10-CM | POA: Diagnosis not present

## 2021-03-04 DIAGNOSIS — G4089 Other seizures: Secondary | ICD-10-CM | POA: Diagnosis not present

## 2021-03-04 DIAGNOSIS — R404 Transient alteration of awareness: Secondary | ICD-10-CM | POA: Diagnosis not present

## 2021-03-04 DIAGNOSIS — C711 Malignant neoplasm of frontal lobe: Secondary | ICD-10-CM | POA: Diagnosis not present

## 2021-03-04 DIAGNOSIS — G9389 Other specified disorders of brain: Secondary | ICD-10-CM | POA: Diagnosis not present

## 2021-03-04 DIAGNOSIS — E039 Hypothyroidism, unspecified: Secondary | ICD-10-CM | POA: Diagnosis not present

## 2021-03-04 DIAGNOSIS — S728X2A Other fracture of left femur, initial encounter for closed fracture: Secondary | ICD-10-CM | POA: Diagnosis not present

## 2021-03-04 DIAGNOSIS — H1033 Unspecified acute conjunctivitis, bilateral: Secondary | ICD-10-CM | POA: Diagnosis not present

## 2021-03-04 NOTE — Progress Notes (Signed)
[facility] arrived to transport pt to [facility]. Pt belongings placed in bag. Discharge paperwork sent with [facility]/patient. Pt denies pain. Pt cleaned and dried before disharge. Surgical dressing intact.

## 2021-03-05 ENCOUNTER — Telehealth: Payer: Self-pay | Admitting: *Deleted

## 2021-03-05 NOTE — Telephone Encounter (Signed)
Darlene Marsh states Darlene Marsh is at [facility] post hip fracture. Will be there ~ 19 more days. Darlene Marsh needs a note stating the Temodar is "On Hold" until she sees Dr Mickeal Skinner again. The fax number is [phone]

## 2021-03-06 ENCOUNTER — Other Ambulatory Visit: Payer: Self-pay | Admitting: Internal Medicine

## 2021-03-06 ENCOUNTER — Encounter (HOSPITAL_COMMUNITY): Payer: Self-pay | Admitting: Orthopedic Surgery

## 2021-03-06 DIAGNOSIS — R569 Unspecified convulsions: Secondary | ICD-10-CM | POA: Diagnosis not present

## 2021-03-06 DIAGNOSIS — I10 Essential (primary) hypertension: Secondary | ICD-10-CM | POA: Diagnosis not present

## 2021-03-06 DIAGNOSIS — C719 Malignant neoplasm of brain, unspecified: Secondary | ICD-10-CM | POA: Diagnosis not present

## 2021-03-06 DIAGNOSIS — C711 Malignant neoplasm of frontal lobe: Secondary | ICD-10-CM

## 2021-03-06 DIAGNOSIS — S72002D Fracture of unspecified part of neck of left femur, subsequent encounter for closed fracture with routine healing: Secondary | ICD-10-CM | POA: Diagnosis not present

## 2021-03-06 NOTE — Progress Notes (Signed)
Faxed note to [facility].

## 2021-03-07 ENCOUNTER — Telehealth: Payer: Self-pay | Admitting: Internal Medicine

## 2021-03-07 ENCOUNTER — Other Ambulatory Visit (HOSPITAL_COMMUNITY): Payer: Self-pay

## 2021-03-07 NOTE — Telephone Encounter (Signed)
Scheduled per sch msg. Called and left msg  

## 2021-03-09 ENCOUNTER — Other Ambulatory Visit: Payer: Self-pay | Admitting: Internal Medicine

## 2021-03-10 ENCOUNTER — Encounter: Payer: Self-pay | Admitting: Internal Medicine

## 2021-03-10 DIAGNOSIS — I10 Essential (primary) hypertension: Secondary | ICD-10-CM | POA: Diagnosis not present

## 2021-03-10 DIAGNOSIS — E782 Mixed hyperlipidemia: Secondary | ICD-10-CM | POA: Diagnosis not present

## 2021-03-10 DIAGNOSIS — C719 Malignant neoplasm of brain, unspecified: Secondary | ICD-10-CM | POA: Diagnosis not present

## 2021-03-10 DIAGNOSIS — G4089 Other seizures: Secondary | ICD-10-CM | POA: Diagnosis not present

## 2021-03-10 DIAGNOSIS — E039 Hypothyroidism, unspecified: Secondary | ICD-10-CM | POA: Diagnosis not present

## 2021-03-10 DIAGNOSIS — S728X2A Other fracture of left femur, initial encounter for closed fracture: Secondary | ICD-10-CM | POA: Diagnosis not present

## 2021-03-10 DIAGNOSIS — M6281 Muscle weakness (generalized): Secondary | ICD-10-CM | POA: Diagnosis not present

## 2021-03-10 DIAGNOSIS — G4700 Insomnia, unspecified: Secondary | ICD-10-CM | POA: Diagnosis not present

## 2021-03-13 ENCOUNTER — Telehealth: Payer: Self-pay | Admitting: *Deleted

## 2021-03-13 ENCOUNTER — Other Ambulatory Visit: Payer: Self-pay | Admitting: *Deleted

## 2021-03-13 DIAGNOSIS — C711 Malignant neoplasm of frontal lobe: Secondary | ICD-10-CM

## 2021-03-13 NOTE — Telephone Encounter (Signed)
Patients daughter called.   Patient is doing rehab at [facility] in [place] post hip fracture.  She states patient was able to take 28 steps on Friday with therapy but today and progressively over the weekend she has declined to the point where she cannot even stand.  She reports tremors in arms and unable to feed herself.  She is alert and oriented but has short term memory loss.    Confirmed with [place] that patient has been taking her Decadron 1 mg daily and hasn't taken any narcotics for the pain.  Per Dr Mickeal Skinner patient is due for MRI and that was being placed on hold until patient was discharged from rehab.  He wants that to happen first.  He also ordered increase in Decadron from 1 mg daily to 4 mg BID.  Hopefully this increase will give her the boost she needs to be able to go out for MRI with transportation services with [facility] if not then [facility] will be utilized.  Faxed new Decadron dose to [phone] and spoke with daughter.

## 2021-03-13 NOTE — Progress Notes (Signed)
Change of location for MRI order.  See telephone note.

## 2021-03-14 DIAGNOSIS — Z96642 Presence of left artificial hip joint: Secondary | ICD-10-CM | POA: Diagnosis not present

## 2021-03-14 DIAGNOSIS — S728X2A Other fracture of left femur, initial encounter for closed fracture: Secondary | ICD-10-CM | POA: Diagnosis not present

## 2021-03-14 DIAGNOSIS — E782 Mixed hyperlipidemia: Secondary | ICD-10-CM | POA: Diagnosis not present

## 2021-03-14 DIAGNOSIS — M6281 Muscle weakness (generalized): Secondary | ICD-10-CM | POA: Diagnosis not present

## 2021-03-14 DIAGNOSIS — R296 Repeated falls: Secondary | ICD-10-CM | POA: Diagnosis not present

## 2021-03-14 DIAGNOSIS — C719 Malignant neoplasm of brain, unspecified: Secondary | ICD-10-CM | POA: Diagnosis not present

## 2021-03-14 DIAGNOSIS — I10 Essential (primary) hypertension: Secondary | ICD-10-CM | POA: Diagnosis not present

## 2021-03-14 DIAGNOSIS — Z471 Aftercare following joint replacement surgery: Secondary | ICD-10-CM | POA: Diagnosis not present

## 2021-03-14 DIAGNOSIS — G4089 Other seizures: Secondary | ICD-10-CM | POA: Diagnosis not present

## 2021-03-17 ENCOUNTER — Telehealth: Payer: Self-pay | Admitting: *Deleted

## 2021-03-17 ENCOUNTER — Other Ambulatory Visit: Payer: Self-pay | Admitting: Radiation Therapy

## 2021-03-17 NOTE — Telephone Encounter (Signed)
Patients daughter called to let us know that MRI has been scheduled for this Wednesday.  She will go out via [facility] and md visit is scheduled for following day with [place] via [facility] as well.  Daughter states that the facility has increased her Decadron as instructed however, there hasn't been any change or improvement.  Patient still is unable to stand.  Patient is confused and isn't following commands with Physical Therapy.  Therapist told daughter that she acts like someone who may have had a stroke.  She questioned if MRI would detect that.  Routing to MD to see if any additional changes to plan and schedule should be made.  Margreta Journey (one of patients daughters) is unable to attend appt (started new job) and requests a phone follow up after MRI results visit with information if at all possible on her mothers condition.

## 2021-03-19 ENCOUNTER — Ambulatory Visit (HOSPITAL_COMMUNITY)
Admission: RE | Admit: 2021-03-19 | Discharge: 2021-03-19 | Disposition: A | Discharge status: Home / Self Care | Payer: Medicare Other | Source: Ambulatory Visit | Attending: Internal Medicine | Admitting: Internal Medicine

## 2021-03-19 DIAGNOSIS — R220 Localized swelling, mass and lump, head: Secondary | ICD-10-CM | POA: Diagnosis not present

## 2021-03-19 DIAGNOSIS — G9389 Other specified disorders of brain: Secondary | ICD-10-CM | POA: Diagnosis not present

## 2021-03-19 DIAGNOSIS — Z85841 Personal history of malignant neoplasm of brain: Secondary | ICD-10-CM | POA: Diagnosis not present

## 2021-03-19 DIAGNOSIS — C711 Malignant neoplasm of frontal lobe: Secondary | ICD-10-CM | POA: Diagnosis not present

## 2021-03-19 MED ORDER — GADOBUTROL 1 MMOL/ML IV SOLN
6.0000 mL | Freq: Once | INTRAVENOUS | Status: AC | PRN
  Administered 2021-03-19: 6 mL via INTRAVENOUS

## 2021-03-20 ENCOUNTER — Inpatient Hospital Stay: Payer: Medicare Other | Attending: Internal Medicine | Admitting: Internal Medicine

## 2021-03-20 ENCOUNTER — Other Ambulatory Visit: Payer: Self-pay

## 2021-03-20 ENCOUNTER — Inpatient Hospital Stay: Payer: Medicare Other

## 2021-03-20 VITALS — BP 122/84 | HR 92 | Temp 97.9°F | Resp 16

## 2021-03-20 DIAGNOSIS — C711 Malignant neoplasm of frontal lobe: Secondary | ICD-10-CM

## 2021-03-20 DIAGNOSIS — G40909 Epilepsy, unspecified, not intractable, without status epilepticus: Secondary | ICD-10-CM | POA: Diagnosis not present

## 2021-03-20 DIAGNOSIS — Z96642 Presence of left artificial hip joint: Secondary | ICD-10-CM | POA: Insufficient documentation

## 2021-03-20 LAB — CBC WITH DIFFERENTIAL (CANCER CENTER ONLY)
Abs Immature Granulocytes: 0.05 10*3/uL (ref 0.00–0.07)
Basophils Absolute: 0 10*3/uL (ref 0.0–0.1)
Basophils Relative: 0 %
Eosinophils Absolute: 0.1 10*3/uL (ref 0.0–0.5)
Eosinophils Relative: 1 %
HCT: 34.6 % — ABNORMAL LOW (ref 36.0–46.0)
Hemoglobin: 12.2 g/dL (ref 12.0–15.0)
Immature Granulocytes: 1 %
Lymphocytes Relative: 15 %
Lymphs Abs: 1.6 10*3/uL (ref 0.7–4.0)
MCH: 33.2 pg (ref 26.0–34.0)
MCHC: 35.3 g/dL (ref 30.0–36.0)
MCV: 94.3 fL (ref 80.0–100.0)
Monocytes Absolute: 0.7 10*3/uL (ref 0.1–1.0)
Monocytes Relative: 7 %
Neutro Abs: 8.3 10*3/uL — ABNORMAL HIGH (ref 1.7–7.7)
Neutrophils Relative %: 76 %
Platelet Count: 474 10*3/uL — ABNORMAL HIGH (ref 150–400)
RBC: 3.67 MIL/uL — ABNORMAL LOW (ref 3.87–5.11)
RDW: 13.2 % (ref 11.5–15.5)
WBC Count: 10.8 10*3/uL — ABNORMAL HIGH (ref 4.0–10.5)
nRBC: 0 % (ref 0.0–0.2)

## 2021-03-20 LAB — CMP (CANCER CENTER ONLY)
ALT: 35 U/L (ref 0–44)
AST: 18 U/L (ref 15–41)
Albumin: 3.1 g/dL — ABNORMAL LOW (ref 3.5–5.0)
Alkaline Phosphatase: 179 U/L — ABNORMAL HIGH (ref 38–126)
Anion gap: 11 (ref 5–15)
BUN: 17 mg/dL (ref 8–23)
CO2: 29 mmol/L (ref 22–32)
Calcium: 9.2 mg/dL (ref 8.9–10.3)
Chloride: 104 mmol/L (ref 98–111)
Creatinine: 0.61 mg/dL (ref 0.44–1.00)
GFR, Estimated: >60 mL/min (ref >60)
Glucose, Bld: 108 mg/dL — ABNORMAL HIGH (ref 70–99)
Potassium: 3.3 mmol/L — ABNORMAL LOW (ref 3.5–5.1)
Sodium: 144 mmol/L (ref 135–145)
Total Bilirubin: 0.8 mg/dL (ref 0.3–1.2)
Total Protein: 6.6 g/dL (ref 6.5–8.1)

## 2021-03-20 NOTE — Progress Notes (Signed)
[facility] at [facility] [place], [place] [address] [phone]   Interval Evaluation  Date of Service: 03/20/21 Patient Name: Darlene Marsh Patient MRN: [id number] Patient DOB: 1950-06-29 Provider: Ventura Sellers, MD  Identifying Statement:  Darlene Marsh is a 70 y.o. female with right frontal glioblastoma   Oncologic History: Oncology History  Glioblastoma multiforme of frontal lobe ([place])  09/05/2020 Surgery   Craniotomy, resection with Dr. Marcello Moores; path demonstrates Glioblastoma IDH-wt   10/07/2020 - 11/19/2020 Radiation Therapy   IMRT and Temozolomide 694m/m2 with Dr. SIsidore Moos  12/26/2020 - 02/26/2021 Chemotherapy   Completes #2 cycles of adjuvant 5-day Temozolomide   [facility]   [facility] replacement for femoral neck fracture following a fall    Dexamethasone 11/11/20: 128m06/16/22: 94m75m6/23/22: 2mg35m/27/22: 12mg77mrupt clinical changes) 12/05/20: 8mg 078m4/22: 6mg 0732m/22: 2mg 10/794m22: 8mg  Bio55mkers:  MGMT Unknown.  IDH 1/2 Wild type.  EGFR Unknown  TERT Unknown   Interval History:  Darlene Marsh today for follow up, now ~3 weeks removed from hip fracture, replacement.  She is currently in rehab center, still unable to bear full weight with the leg.  She has experienced considerable short term memory impairment, confusion at the center, though this has improved since higher dose decadron was started on 10/17.  Her energy and appetite are also significantly improved since that time. They are hopeful she can be discharged to home within a week or so.  No further recurrence of seizures.  Decadron is at 94mg BID. 43mP (09/26/20) Patient presented to medical attention earlier this month with new onset seizure episode, characterized as "left arm shaking and tremoring, with some involvement of the leg".  Prior to this she had no complaints, and was independent.  CNS imaging demonstrated  nodularly enhancing right frontal mass.  She underwent craniotomy, resection with Dr. Thomas on Marcello Moores; path demonstrated glioblastoma IDH-wt.  Following surgery, she experienced some left sided weakness and was discharged for inpatient rehab course.  After discharge this past week, she is close to her baseline, still using a walker for some ambulation.  Otherwise no additional seizures, no headaches.  Medications: Current Outpatient Medications on File Prior to Visit  Medication Sig Dispense Refill   aspirin (ASPIRIN CHILDRENS) 81 MG chewable tablet Chew 1 tablet (81 mg total) by mouth 2 (two) times daily with a meal. 90 tablet 0   dexamethasone (DECADRON) 1 MG tablet Take 4 mg by mouth 2 (two) times daily with a meal.     furosemide (LASIX) 20 MG tablet TAKE 1 TABLET(20 MG) BY MOUTH DAILY 30 tablet 0   HYDROcodone-acetaminophen (NORCO/VICODIN) 5-325 MG tablet Take 1 tablet by mouth every 4 (four) hours as needed for moderate pain (pain score 4-6). 30 tablet 0   levETIRAcetam (KEPPRA) 1000 MG tablet Take 1 tablet (1,000 mg total) by mouth 2 (two) times daily. 60 tablet 0   ondansetron (ZOFRAN) 8 MG tablet Take 1 tablet (8 mg total) by mouth 2 (two) times daily as needed (nausea and vomiting). May take 30-60 minutes prior to Temodar administration if nausea/vomiting occurs. (Patient not taking: Reported on 03/20/2021) 30 tablet 1   temozolomide (TEMODAR) 100 MG capsule Take 2 capsules (200 mg total) by mouth daily. May take on an empty stomach to decrease nausea & vomiting. (Patient not taking: Reported on 03/20/2021) 10 capsule 0   temozolomide (TEMODAR) 20 MG capsule Take 1 capsule (20 mg  total) by mouth daily. May take on an empty stomach to decrease nausea & vomiting. (Patient not taking: Reported on 03/20/2021) 5 capsule 0   No current facility-administered medications on file prior to visit.    Allergies: No Known Allergies Past Medical History:  Past Medical History:  Diagnosis Date    Hyperlipidemia    Hypertension    Thyroid disease    underactive   Tremors of nervous system    Past Surgical History:  Past Surgical History:  Procedure Laterality Date   APPLICATION OF CRANIAL NAVIGATION Right 09/05/2020   Procedure: APPLICATION OF CRANIAL NAVIGATION;  Surgeon: Vallarie Mare, MD;  Location: [facility];  Service: Neurosurgery;  Laterality: Right;   CRANIOTOMY Right 09/05/2020   Procedure: Craniotomy - right Frontal interhemispheric approach for tumor resection with BRAIN LAB;  Surgeon: Vallarie Mare, MD;  Location: [facility];  Service: Neurosurgery;  Laterality: Right;   TOTAL HIP ARTHROPLASTY Left 02/27/2021   Procedure: TOTAL HIP ARTHROPLASTY ANTERIOR APPROACH;  Surgeon: Rod Can, MD;  Location: [facility];  Service: Orthopedics;  Laterality: Left;   Social History:  Social History   Socioeconomic History   Marital status: Married    Spouse name: Not on file   Number of children: Not on file   Years of education: Not on file   Highest education level: Not on file  Occupational History   Not on file  Tobacco Use   Smoking status: Never   Smokeless tobacco: Never  Vaping Use   Vaping Use: Never used  Substance and Sexual Activity   Alcohol use: Never   Drug use: Never   Sexual activity: Not on file  Other Topics Concern   Not on file  Social History Narrative   Not on file   Social Determinants of Health   Financial Resource Strain: Not on file  Food Insecurity: Not on file  Transportation Needs: Not on file  Physical Activity: Not on file  Stress: Not on file  Social Connections: Not on file  Intimate Partner Violence: Not on file   Family History:  Family History  Problem Relation Age of Onset   Cancer Mother        Breast cancer   Cancer Sister        Breast   Cancer Sister        Breast    Review of Systems: Constitutional: Doesn't report fevers, chills or abnormal weight loss Eyes: Doesn't report blurriness of vision Ears, nose,  mouth, throat, and face: Doesn't report sore throat Respiratory: Doesn't report cough, dyspnea or wheezes Cardiovascular: Doesn't report palpitation, chest discomfort  Gastrointestinal:  Doesn't report nausea, constipation, diarrhea GU: Doesn't report incontinence Skin: Doesn't report skin rashes Neurological: Per HPI Musculoskeletal: Doesn't report joint pain Behavioral/Psych: Doesn't report anxiety  Physical Exam: Vitals:   03/20/21 1215  BP: 122/84  Pulse: 92  Resp: 16  Temp: 97.9 F (36.6 C)  SpO2: 98%    KPS: 60. General: Alert, cooperative, pleasant, in no acute distress Head: Normal EENT: No conjunctival injection or scleral icterus.  Lungs: Resp effort normal Cardiac: Regular rate Abdomen: Non-distended abdomen Skin: No rashes cyanosis or petechiae. Extremities: Left foot lateralized edema  Neurologic Exam: Mental Status: Awake, alert, attentive to examiner. Oriented to self and environment. Language is fluent with intact comprehension.  Cranial Nerves: Visual acuity is grossly normal. Visual fields are full. Extra-ocular movements intact. No ptosis. Face is symmetric Motor: Tone and bulk are normal. Left arm 4+/5, left leg  with orthopedic limitation. Right sided weakness is functional in nature.  Reflexes are symmetric, no pathologic reflexes present.  Sensory: Intact to light touch Gait: Wheelchair   Labs: I have reviewed the data as listed    Component Value Date/Time   NA 144 03/20/2021 1207   K 3.3 (L) 03/20/2021 1207   CL 104 03/20/2021 1207   CO2 29 03/20/2021 1207   GLUCOSE 108 (H) 03/20/2021 1207   BUN 17 03/20/2021 1207   CREATININE 0.61 03/20/2021 1207   CREATININE 0.74 03/15/2019 0817   CALCIUM 9.2 03/20/2021 1207   PROT 6.6 03/20/2021 1207   ALBUMIN 3.1 (L) 03/20/2021 1207   AST 18 03/20/2021 1207   ALT 35 03/20/2021 1207   ALKPHOS 179 (H) 03/20/2021 1207   BILITOT 0.8 03/20/2021 1207   GFRNONAA >60 03/20/2021 1207   GFRNONAA 75  01/19/2018 0939   GFRAA 87 01/19/2018 0939   Lab Results  Component Value Date   WBC 10.8 (H) 03/20/2021   NEUTROABS 8.3 (H) 03/20/2021   HGB 12.2 03/20/2021   HCT 34.6 (L) 03/20/2021   MCV 94.3 03/20/2021   PLT 474 (H) 03/20/2021    Assessment/Plan Glioblastoma multiforme of frontal lobe (HCC) [C71.1]  Ubah Radke is clinically stable today, now in rehab recovering from L hip fracture and replacement.    MRI brain demonstrates progression of disease with considerable T2/FLAIR signal, suggestive of inflammatory process.  Pilar Plate tumor progression is also on the differential, but she is not stable enough yet to resume chemo.  Avastin is also not in consideration because of recent major surgery.  Plan right now is to repeat brain MRI in 1 month.  We recommended continuing the decadron 60m BID for now given clinical improvement; we will call her in 1 week to begin tapering instructions.  Should continue Keppra 10059mBID.  We ask that PeJesicca Dipierroeturn to clinic in 1 months with MRI brain for evaluation prior to further treatment plan decision, or sooner as needed.  All questions were answered. The patient knows to call the clinic with any problems, questions or concerns. No barriers to learning were detected.  I have spent a total of 40 minutes of face-to-face and non-face-to-face time, excluding clinical staff time, preparing to see patient, ordering tests and/or medications, counseling the patient, and independently interpreting results and communicating results to the patient/family/caregiver    ZaVentura SellersMD Medical Director of Neuro-Oncology [facility] WeKearney Park0/20/22 1:34 PM

## 2021-03-21 ENCOUNTER — Telehealth: Payer: Self-pay | Admitting: Internal Medicine

## 2021-03-21 DIAGNOSIS — S728X2A Other fracture of left femur, initial encounter for closed fracture: Secondary | ICD-10-CM | POA: Diagnosis not present

## 2021-03-21 DIAGNOSIS — G4089 Other seizures: Secondary | ICD-10-CM | POA: Diagnosis not present

## 2021-03-21 DIAGNOSIS — I10 Essential (primary) hypertension: Secondary | ICD-10-CM | POA: Diagnosis not present

## 2021-03-21 DIAGNOSIS — C719 Malignant neoplasm of brain, unspecified: Secondary | ICD-10-CM | POA: Diagnosis not present

## 2021-03-21 DIAGNOSIS — H1033 Unspecified acute conjunctivitis, bilateral: Secondary | ICD-10-CM | POA: Diagnosis not present

## 2021-03-21 NOTE — Telephone Encounter (Signed)
Scheduled appt per 10/20 los. Called pt, no answer. Left msg with appt date and time.

## 2021-03-24 ENCOUNTER — Other Ambulatory Visit: Payer: Self-pay | Admitting: Radiation Therapy

## 2021-03-24 ENCOUNTER — Ambulatory Visit (HOSPITAL_BASED_OUTPATIENT_CLINIC_OR_DEPARTMENT_OTHER): Payer: Medicare Other | Admitting: Nurse Practitioner

## 2021-03-24 DIAGNOSIS — C719 Malignant neoplasm of brain, unspecified: Secondary | ICD-10-CM | POA: Diagnosis not present

## 2021-03-24 DIAGNOSIS — S728X2A Other fracture of left femur, initial encounter for closed fracture: Secondary | ICD-10-CM | POA: Diagnosis not present

## 2021-03-24 DIAGNOSIS — G4089 Other seizures: Secondary | ICD-10-CM | POA: Diagnosis not present

## 2021-03-24 DIAGNOSIS — I10 Essential (primary) hypertension: Secondary | ICD-10-CM | POA: Diagnosis not present

## 2021-03-24 DIAGNOSIS — H1033 Unspecified acute conjunctivitis, bilateral: Secondary | ICD-10-CM | POA: Diagnosis not present

## 2021-03-25 ENCOUNTER — Telehealth: Payer: Self-pay | Admitting: *Deleted

## 2021-03-25 NOTE — Telephone Encounter (Signed)
Received call from [place] Unit Manager March Rummage.  She called stating that the patients progress in rehab post hip fracture has declined.  The physical therapist wanted to have the last office note to see the doctors update on the patients status.    Faxed last office note to [phone]

## 2021-03-27 ENCOUNTER — Inpatient Hospital Stay (HOSPITAL_BASED_OUTPATIENT_CLINIC_OR_DEPARTMENT_OTHER): Payer: Medicare Other | Admitting: Internal Medicine

## 2021-03-27 DIAGNOSIS — G40909 Epilepsy, unspecified, not intractable, without status epilepticus: Secondary | ICD-10-CM

## 2021-03-27 DIAGNOSIS — C711 Malignant neoplasm of frontal lobe: Secondary | ICD-10-CM

## 2021-03-27 NOTE — Progress Notes (Signed)
I connected with Darlene Marsh on 03/27/21 at 12:30 PM EDT by telephone visit and verified that I am speaking with the correct person using two identifiers.  I discussed the limitations, risks, security and privacy concerns of performing an evaluation and management service by telemedicine and the availability of in-person appointments. I also discussed with the patient that there may be a patient responsible charge related to this service. The patient expressed understanding and agreed to proceed.  Other persons participating in the visit and their role in the encounter:  spouse  Patient's location:  [place] location:  Theatre stage manager Complaint:  Glioblastoma multiforme of frontal lobe ([place])  Seizure disorder ([facility])  History of Present Ilness: Darlene Marsh describes continued improvement in energy, cognition since starting the higher dose of decadron (4mg  BID) on 10/17.  No new or progressive changes, no seizures or headaches.  Her ability to stand and bear weight is also improving through intensive rehab efforts.  They anticipate a discharge to home within 7-10 days. Observations: Language and cognition at baseline Assessment and Plan: Glioblastoma multiforme of frontal lobe (HCC)  Seizure disorder (HCC)  Clinically stable or improved with decadron.  Recommended decreasing to 4mg /2mg  starting on 10/31, then decreasign further to 4mg  daily on 11/14.  We will then follow up with her after MRI scheduled for 11/22.  I discussed the assessment and treatment plan with the patient.  The patient was provided an opportunity to ask questions and all were answered.  The patient agreed with the plan and demonstrated understanding of the instructions.    The patient was advised to call back or seek an in-person evaluation if the symptoms worsen or if the condition fails to improve as anticipated.  I provided 5-10 minutes of non-face-to-face time during this enocunter.  Ventura Sellers,  MD   I provided 15 minutes of non face-to-face telephone visit time during this encounter, and > 50% was spent counseling as documented under my assessment & plan.

## 2021-04-01 ENCOUNTER — Ambulatory Visit: Payer: Medicare Other | Admitting: Internal Medicine

## 2021-04-01 ENCOUNTER — Other Ambulatory Visit: Payer: Medicare Other

## 2021-04-02 ENCOUNTER — Other Ambulatory Visit: Payer: Self-pay | Admitting: *Deleted

## 2021-04-02 ENCOUNTER — Telehealth: Payer: Self-pay | Admitting: *Deleted

## 2021-04-02 ENCOUNTER — Telehealth: Payer: Self-pay | Admitting: Internal Medicine

## 2021-04-02 NOTE — Telephone Encounter (Signed)
Scheduled per 11/2 sch msg, pt has been called and confirmed appt

## 2021-04-02 NOTE — Telephone Encounter (Signed)
Daughter called to see if we can get Darlene Marsh in for an earlier appointment. States her left hand and left leg are getting contracted.

## 2021-04-02 NOTE — Patient Outreach (Signed)
Member screened for potential [facility] Care Management needs. Mrs. Gamboa resides in [facility].  [facility] SW reports member is progressing slowly. Family is in the process of scheduling oncology appointment.   Will continue to follow.    Marthenia Rolling, MSN, RN,BSN [facility] [place] Acute Care Coordinator [phone] [facility]) [phone]  (Toll free office)

## 2021-04-03 ENCOUNTER — Encounter: Payer: Self-pay | Admitting: *Deleted

## 2021-04-03 DIAGNOSIS — C719 Malignant neoplasm of brain, unspecified: Secondary | ICD-10-CM | POA: Diagnosis not present

## 2021-04-03 DIAGNOSIS — I10 Essential (primary) hypertension: Secondary | ICD-10-CM | POA: Diagnosis not present

## 2021-04-03 DIAGNOSIS — M6281 Muscle weakness (generalized): Secondary | ICD-10-CM | POA: Diagnosis not present

## 2021-04-03 DIAGNOSIS — E782 Mixed hyperlipidemia: Secondary | ICD-10-CM | POA: Diagnosis not present

## 2021-04-03 DIAGNOSIS — S72002D Fracture of unspecified part of neck of left femur, subsequent encounter for closed fracture with routine healing: Secondary | ICD-10-CM | POA: Diagnosis not present

## 2021-04-03 DIAGNOSIS — G40509 Epileptic seizures related to external causes, not intractable, without status epilepticus: Secondary | ICD-10-CM | POA: Diagnosis not present

## 2021-04-03 DIAGNOSIS — R296 Repeated falls: Secondary | ICD-10-CM | POA: Diagnosis not present

## 2021-04-03 DIAGNOSIS — Z4789 Encounter for other orthopedic aftercare: Secondary | ICD-10-CM | POA: Diagnosis not present

## 2021-04-07 ENCOUNTER — Ambulatory Visit: Payer: Medicare Other | Admitting: Internal Medicine

## 2021-04-07 ENCOUNTER — Encounter: Payer: Self-pay | Admitting: *Deleted

## 2021-04-07 ENCOUNTER — Encounter: Payer: Self-pay | Admitting: Internal Medicine

## 2021-04-07 ENCOUNTER — Other Ambulatory Visit (HOSPITAL_COMMUNITY): Payer: Self-pay

## 2021-04-11 ENCOUNTER — Telehealth: Payer: Self-pay | Admitting: *Deleted

## 2021-04-11 NOTE — Telephone Encounter (Signed)
PC received from Ronald with [facility], she has done an admission assessment on this patient today.  Nursing will be seeing the patient once/week x 4 weeks.  She will also be evaluated by PT, OT, CSW, & Speech.  She will also have a home health aide.  RN also states patient has not been taking aspirin 81 mg daily, family was unaware she is to be taking this.  Dr. Mickeal Skinner informed.

## 2021-04-14 DIAGNOSIS — G4700 Insomnia, unspecified: Secondary | ICD-10-CM | POA: Diagnosis not present

## 2021-04-14 DIAGNOSIS — I10 Essential (primary) hypertension: Secondary | ICD-10-CM | POA: Diagnosis not present

## 2021-04-14 DIAGNOSIS — R54 Age-related physical debility: Secondary | ICD-10-CM | POA: Diagnosis not present

## 2021-04-14 DIAGNOSIS — F32A Depression, unspecified: Secondary | ICD-10-CM | POA: Diagnosis not present

## 2021-04-14 DIAGNOSIS — E039 Hypothyroidism, unspecified: Secondary | ICD-10-CM | POA: Diagnosis not present

## 2021-04-14 DIAGNOSIS — C711 Malignant neoplasm of frontal lobe: Secondary | ICD-10-CM | POA: Diagnosis not present

## 2021-04-14 DIAGNOSIS — E785 Hyperlipidemia, unspecified: Secondary | ICD-10-CM | POA: Diagnosis not present

## 2021-04-14 DIAGNOSIS — G40909 Epilepsy, unspecified, not intractable, without status epilepticus: Secondary | ICD-10-CM | POA: Diagnosis not present

## 2021-04-14 DIAGNOSIS — M62838 Other muscle spasm: Secondary | ICD-10-CM | POA: Diagnosis not present

## 2021-04-14 DIAGNOSIS — Z96642 Presence of left artificial hip joint: Secondary | ICD-10-CM | POA: Diagnosis not present

## 2021-04-14 DIAGNOSIS — S72002D Fracture of unspecified part of neck of left femur, subsequent encounter for closed fracture with routine healing: Secondary | ICD-10-CM | POA: Diagnosis not present

## 2021-04-14 DIAGNOSIS — F419 Anxiety disorder, unspecified: Secondary | ICD-10-CM | POA: Diagnosis not present

## 2021-04-16 ENCOUNTER — Ambulatory Visit (HOSPITAL_COMMUNITY)
Admission: RE | Admit: 2021-04-16 | Discharge: 2021-04-16 | Disposition: A | Discharge status: Home / Self Care | Payer: Medicare Other | Source: Ambulatory Visit | Attending: Internal Medicine | Admitting: Internal Medicine

## 2021-04-16 DIAGNOSIS — C719 Malignant neoplasm of brain, unspecified: Secondary | ICD-10-CM | POA: Diagnosis not present

## 2021-04-16 DIAGNOSIS — C711 Malignant neoplasm of frontal lobe: Secondary | ICD-10-CM | POA: Insufficient documentation

## 2021-04-16 DIAGNOSIS — R220 Localized swelling, mass and lump, head: Secondary | ICD-10-CM | POA: Diagnosis not present

## 2021-04-16 MED ORDER — GADOBUTROL 1 MMOL/ML IV SOLN
6.0000 mL | Freq: Once | INTRAVENOUS | Status: AC | PRN
  Administered 2021-04-16: 6 mL via INTRAVENOUS

## 2021-04-17 ENCOUNTER — Inpatient Hospital Stay: Payer: Medicare Other | Attending: Internal Medicine | Admitting: Internal Medicine

## 2021-04-17 ENCOUNTER — Other Ambulatory Visit: Payer: Self-pay

## 2021-04-17 VITALS — BP 123/97 | HR 118 | Temp 97.7°F | Resp 18

## 2021-04-17 DIAGNOSIS — Z96642 Presence of left artificial hip joint: Secondary | ICD-10-CM | POA: Diagnosis not present

## 2021-04-17 DIAGNOSIS — F39 Unspecified mood [affective] disorder: Secondary | ICD-10-CM | POA: Insufficient documentation

## 2021-04-17 DIAGNOSIS — G40909 Epilepsy, unspecified, not intractable, without status epilepticus: Secondary | ICD-10-CM

## 2021-04-17 DIAGNOSIS — C711 Malignant neoplasm of frontal lobe: Secondary | ICD-10-CM | POA: Insufficient documentation

## 2021-04-17 MED ORDER — DIVALPROEX SODIUM 500 MG PO DR TAB: 1000.0000 mg | DELAYED_RELEASE_TABLET | Freq: Two times a day (BID) | ORAL | 3 refills | Status: DC

## 2021-04-17 MED ORDER — DEXAMETHASONE 1 MG PO TABS: 2.0000 mg | ORAL_TABLET | Freq: Every day | ORAL | Status: DC

## 2021-04-17 NOTE — Progress Notes (Signed)
[facility] [place], [place] [address] [phone]   Interval Evaluation  Date of Service: 04/17/21 Patient Name: Darlene Marsh Patient MRN: [id number] Patient DOB: 26-Nov-1950 Provider: Ventura Sellers, MD  Identifying Statement:  Darlene Marsh is a 70 y.o. female with right frontal glioblastoma   Oncologic History: Oncology History  Glioblastoma multiforme of frontal lobe ([place])  09/05/2020 Surgery   Craniotomy, resection with Dr. Marcello Moores; path demonstrates Glioblastoma IDH-wt   10/07/2020 - 11/19/2020 Radiation Therapy   IMRT and Temozolomide 70m/m2 with Dr. SIsidore Moos  12/26/2020 - 02/26/2021 Chemotherapy   Completes #2 cycles of adjuvant 5-day Temozolomide   [facility]   Undergoes L hip replacement for femoral neck fracture following a fall    Dexamethasone 11/11/20: 147m06/16/22: 38m75m6/23/22: 2mg46m/27/22: 12mg65mrupt clinical changes) 12/05/20: 8mg 078m4/22: 6mg 0759m/22: 2mg 10/83m22: 8mg  Bio65mkers:  MGMT Unknown.  IDH 1/2 Wild type.  EGFR Unknown  TERT Unknown   Interval History:  [name] today for follow up after recent MRI brain.  She is now at home as of the beginning of the week.  Her condition has declined, she is doing very little independently.  She is also frequently confused, irritable, and agitated with family.  She is fully dependent on them for all ADLs.  Appetite has been poor, weight loss is apparent.  No further recurrence of seizures.  Decadron is at 38mg daily36mH+P (09/26/20) Patient presented to medical attention earlier this month with new onset seizure episode, characterized as "left arm shaking and tremoring, with some involvement of the leg".  Prior to this she had no complaints, and was independent.  CNS imaging demonstrated nodularly enhancing right frontal mass.  She underwent craniotomy, resection with Dr. Thomas on Marcello Moores; path demonstrated  glioblastoma IDH-wt.  Following surgery, she experienced some left sided weakness and was discharged for inpatient rehab course.  After discharge this past week, she is close to her baseline, still using a walker for some ambulation.  Otherwise no additional seizures, no headaches.  Medications: Current Outpatient Medications on File Prior to Visit  Medication Sig Dispense Refill   busPIRone (BUSPAR) 7.5 MG tablet Take 7.5 mg by mouth 2 (two) times daily.     cyclobenzaprine (FLEXERIL) 5 MG tablet Take 5 mg by mouth at bedtime as needed.     dexamethasone (DECADRON) 1 MG tablet Take 4 mg by mouth daily.     levETIRAcetam (KEPPRA) 1000 MG tablet Take 1 tablet (1,000 mg total) by mouth 2 (two) times daily. 60 tablet 0   furosemide (LASIX) 20 MG tablet TAKE 1 TABLET(20 MG) BY MOUTH DAILY (Patient not taking: Reported on 04/17/2021) 30 tablet 0   HYDROcodone-acetaminophen (NORCO/VICODIN) 5-325 MG tablet Take 1 tablet by mouth every 4 (four) hours as needed for moderate pain (pain score 4-6). (Patient not taking: Reported on 04/17/2021) 30 tablet 0   ondansetron (ZOFRAN) 8 MG tablet Take 1 tablet (8 mg total) by mouth 2 (two) times daily as needed (nausea and vomiting). May take 30-60 minutes prior to Temodar administration if nausea/vomiting occurs. (Patient not taking: Reported on 03/20/2021) 30 tablet 1   No current facility-administered medications on file prior to visit.    Allergies: No Known Allergies Past Medical History:  Past Medical History:  Diagnosis Date   Hyperlipidemia    Hypertension    Thyroid disease    underactive   Tremors of nervous system  Past Surgical History:  Past Surgical History:  Procedure Laterality Date   APPLICATION OF CRANIAL NAVIGATION Right 09/05/2020   Procedure: APPLICATION OF CRANIAL NAVIGATION;  Surgeon: Vallarie Mare, MD;  Location: [facility];  Service: Neurosurgery;  Laterality: Right;   CRANIOTOMY Right 09/05/2020   Procedure: Craniotomy - right  Frontal interhemispheric approach for tumor resection with BRAIN LAB;  Surgeon: Vallarie Mare, MD;  Location: [facility];  Service: Neurosurgery;  Laterality: Right;   TOTAL HIP ARTHROPLASTY Left 02/27/2021   Procedure: TOTAL HIP ARTHROPLASTY ANTERIOR APPROACH;  Surgeon: Rod Can, MD;  Location: [facility];  Service: Orthopedics;  Laterality: Left;   Social History:  Social History   Socioeconomic History   Marital status: Married    Spouse name: Not on file   Number of children: Not on file   Years of education: Not on file   Highest education level: Not on file  Occupational History   Not on file  Tobacco Use   Smoking status: Never   Smokeless tobacco: Never  Vaping Use   Vaping Use: Never used  Substance and Sexual Activity   Alcohol use: Never   Drug use: Never   Sexual activity: Not on file  Other Topics Concern   Not on file  Social History Narrative   Not on file   Social Determinants of Health   Financial Resource Strain: Not on file  Food Insecurity: Not on file  Transportation Needs: Not on file  Physical Activity: Not on file  Stress: Not on file  Social Connections: Not on file  Intimate Partner Violence: Not on file   Family History:  Family History  Problem Relation Age of Onset   Cancer Mother        Breast cancer   Cancer Sister        Breast   Cancer Sister        Breast    Review of Systems: Constitutional: Doesn't report fevers, chills or abnormal weight loss Eyes: Doesn't report blurriness of vision Ears, nose, mouth, throat, and face: Doesn't report sore throat Respiratory: Doesn't report cough, dyspnea or wheezes Cardiovascular: Doesn't report palpitation, chest discomfort  Gastrointestinal:  Doesn't report nausea, constipation, diarrhea GU: Doesn't report incontinence Skin: Doesn't report skin rashes Neurological: Per HPI Musculoskeletal: Doesn't report joint pain Behavioral/Psych: Doesn't report anxiety  Physical Exam: Vitals:    04/17/21 1234  BP: (!) 123/97  Pulse: (!) 118  Resp: 18  Temp: 97.7 F (36.5 C)  SpO2: 97%    KPS: 50. General: Alert, cooperative, pleasant, in no acute distress Head: Normal EENT: No conjunctival injection or scleral icterus.  Lungs: Resp effort normal Cardiac: Regular rate Abdomen: Non-distended abdomen Skin: No rashes cyanosis or petechiae. Extremities: thin appearing  Neurologic Exam: Mental Status: Awake, alert, attentive to examiner. Oriented to self and environment. Language is fluent with intact comprehension.  Dense left environmental and body neglect is noted. Cranial Nerves: Visual acuity is grossly normal. Visual fields are full. Extra-ocular movements intact. No ptosis. Face is symmetric Motor: Tone and bulk are normal. Left arm and leg 4+/5, limited by motor neglect.  Reflexes are symmetric, no pathologic reflexes present.  Sensory: Intact to light touch Gait: Wheelchair   Labs: I have reviewed the data as listed    Component Value Date/Time   NA 144 03/20/2021 1207   K 3.3 (L) 03/20/2021 1207   CL 104 03/20/2021 1207   CO2 29 03/20/2021 1207   GLUCOSE 108 (H) 03/20/2021 1207  BUN 17 03/20/2021 1207   CREATININE 0.61 03/20/2021 1207   CREATININE 0.74 03/15/2019 0817   CALCIUM 9.2 03/20/2021 1207   PROT 6.6 03/20/2021 1207   ALBUMIN 3.1 (L) 03/20/2021 1207   AST 18 03/20/2021 1207   ALT 35 03/20/2021 1207   ALKPHOS 179 (H) 03/20/2021 1207   BILITOT 0.8 03/20/2021 1207   GFRNONAA >60 03/20/2021 1207   GFRNONAA 75 01/19/2018 0939   GFRAA 87 01/19/2018 0939   Lab Results  Component Value Date   WBC 10.8 (H) 03/20/2021   NEUTROABS 8.3 (H) 03/20/2021   HGB 12.2 03/20/2021   HCT 34.6 (L) 03/20/2021   MCV 94.3 03/20/2021   PLT 474 (H) 03/20/2021   Imaging:  [facility] Clinician Interpretation: I have personally reviewed the CNS images as listed.  My interpretation, in the context of the patient's clinical presentation, is progressive disease  MR  BRAIN W WO CONTRAST  Result Date: 04/16/2021 CLINICAL DATA:  Glioblastoma, assess treatment response EXAM: MRI HEAD WITHOUT AND WITH CONTRAST TECHNIQUE: Multiplanar, multiecho pulse sequences of the brain and surrounding structures were obtained without and with intravenous contrast. CONTRAST:  68m GADAVIST GADOBUTROL 1 MMOL/ML IV SOLN COMPARISON:  03/19/2021 FINDINGS: Evaluation is somewhat limited by motion artifact. Brain: Redemonstrated peripheral enhancing mass in the paramedian right frontal lobe, which measures up to 2.4 x 2.2 x 2.9 cm (series 16, image 117 and series 17, image 17), previously 2.3 x 2.2 x 2.9 cm when remeasured similarly. Additional area of ill-defined contrast enhancement in the more anterior right frontal lobe, measuring approximately 0.9 x 0.8 x 0.6 cm (series 16, image 101 and series 17, image 21), previously 0.8 x 0.6 x 0.4 cm when remeasured similarly. Slightly decreased surrounding associated T2 hyperintense signal. No acute infarct, hemorrhage, or midline shift. No extra-axial collection or hydrocephalus Vascular: Normal flow voids. Skull and upper cervical spine: Status post right frontal craniotomy. Otherwise normal marrow signal. Sinuses/Orbits: Mucosal thickening in the right frontal sinus, anterior ethmoid air cells and maxillary sinuses. The orbits are unremarkable. Other: Trace fluid in right mastoid air cells. IMPRESSION: 1. Slight interval increase in the size of a peripherally enhancing right frontal lobe mass, with slightly decreased associated T2 hyperintense signal. This remains concerning for tumor progression. 2. Slight interval increase in the additional area of enhancement in the more anterior right frontal lobe, which remains concerning for an additional focus of tumor. Electronically Signed   By: AMerilyn BabaM.D.   On: 04/16/2021 16:46   MR Brain W Wo Contrast  Result Date: 03/20/2021 CLINICAL DATA:  History of GBM, assess treatment response EXAM: MRI HEAD  WITHOUT AND WITH CONTRAST TECHNIQUE: Multiplanar, multiecho pulse sequences of the brain and surrounding structures were obtained without and with intravenous contrast. CONTRAST:  634mGADAVIST GADOBUTROL 1 MMOL/ML IV SOLN COMPARISON:  01/02/2021. FINDINGS: Evaluation is somewhat limited by motion artifact affecting several sequences Brain: Redemonstrated peripherally enhancing mass in the paramedian right frontal lobe, which measures up to 2.3 x 2.2 x 2.8 cm (series 17, image 41 and series 18, image 17), previously 1.9 x 1.8 x 2.2 cm when measured similarly. Interval increase in surrounding T2 hyperintense signal, which now extends further into the lateral right frontal lobe and more posteriorly into the right parietal lobe, with gyral expansion (series 11, image 37). Additional area of abnormal signal, measuring up to 7 mm in the more anterior right frontal lobe (series 17, image 36), which was not present on the prior exam. No acute infarct, hemorrhage,  or midline shift. No extra-axial collection or hydrocephalus. Vascular: Normal flow voids. Skull and upper cervical spine: Status post right frontal craniotomy. Sinuses/Orbits: Negative. Other: Trace fluid in right mastoid air cells. IMPRESSION: 1. Interval increase in the size of a right frontal peripherally enhancing mass, with increased associated T2 hyperintense signal, concerning for tumor progression. 2. Additional area of enhancement in the more anterior right frontal lobe, which was not present on the prior exam concerning for an additional focus of tumor. 3. Electronically Signed   By: Merilyn Baba M.D.   On: 03/20/2021 03:35    Assessment/Plan Glioblastoma multiforme of frontal lobe ([place]) [C71.1]  Beila Purdie is clinically progressive today, with noted decline in functional independence and cognition over the past few weeks.  She has not returned to her prior baseline previous to hip fracture and replacement.  MRI demonstrates progression of  disease with right frontal lobe mass.  We had an extensive goals of care discussion today.  They understand she is not a good candidate for further surgery, or even chemotherapy at this point.  They are interested in palliative care approaches and would like to visit/consult with home hospice team.  For agitation, mood issues, recommended transitioning from [place] to Depakote.  Will start VPA 1055m BID, after 3 days she can then discontinue the Keppra.  Decadron should also be decreased to 267mdaily.  We ask that PeLura Falornd her family reach out to [facility] they have made decision regarding hospice.  All questions were answered. The patient knows to call the clinic with any problems, questions or concerns. No barriers to learning were detected.  I have spent a total of 40 minutes of face-to-face and non-face-to-face time, excluding clinical staff time, preparing to see patient, ordering tests and/or medications, counseling the patient, and independently interpreting results and communicating results to the patient/family/caregiver    ZaVentura SellersMD Medical Director of Neuro-Oncology [facility]1/17/22 2:11 PM

## 2021-04-18 DIAGNOSIS — I10 Essential (primary) hypertension: Secondary | ICD-10-CM | POA: Diagnosis not present

## 2021-04-18 DIAGNOSIS — E785 Hyperlipidemia, unspecified: Secondary | ICD-10-CM | POA: Diagnosis not present

## 2021-04-18 DIAGNOSIS — R569 Unspecified convulsions: Secondary | ICD-10-CM | POA: Diagnosis not present

## 2021-04-18 DIAGNOSIS — C719 Malignant neoplasm of brain, unspecified: Secondary | ICD-10-CM | POA: Diagnosis not present

## 2021-04-18 DIAGNOSIS — E039 Hypothyroidism, unspecified: Secondary | ICD-10-CM | POA: Diagnosis not present

## 2021-04-19 ENCOUNTER — Encounter: Payer: Self-pay | Admitting: Internal Medicine

## 2021-04-20 ENCOUNTER — Encounter: Payer: Self-pay | Admitting: Internal Medicine

## 2021-04-20 DIAGNOSIS — E039 Hypothyroidism, unspecified: Secondary | ICD-10-CM | POA: Diagnosis not present

## 2021-04-20 DIAGNOSIS — I10 Essential (primary) hypertension: Secondary | ICD-10-CM | POA: Diagnosis not present

## 2021-04-20 DIAGNOSIS — R569 Unspecified convulsions: Secondary | ICD-10-CM | POA: Diagnosis not present

## 2021-04-20 DIAGNOSIS — E785 Hyperlipidemia, unspecified: Secondary | ICD-10-CM | POA: Diagnosis not present

## 2021-04-20 DIAGNOSIS — C719 Malignant neoplasm of brain, unspecified: Secondary | ICD-10-CM | POA: Diagnosis not present

## 2021-04-21 ENCOUNTER — Ambulatory Visit: Payer: Medicare Other | Admitting: Internal Medicine

## 2021-04-21 DIAGNOSIS — E039 Hypothyroidism, unspecified: Secondary | ICD-10-CM | POA: Diagnosis not present

## 2021-04-21 DIAGNOSIS — I10 Essential (primary) hypertension: Secondary | ICD-10-CM | POA: Diagnosis not present

## 2021-04-21 DIAGNOSIS — C719 Malignant neoplasm of brain, unspecified: Secondary | ICD-10-CM | POA: Diagnosis not present

## 2021-04-21 DIAGNOSIS — R569 Unspecified convulsions: Secondary | ICD-10-CM | POA: Diagnosis not present

## 2021-04-21 DIAGNOSIS — E785 Hyperlipidemia, unspecified: Secondary | ICD-10-CM | POA: Diagnosis not present

## 2021-04-22 DIAGNOSIS — E785 Hyperlipidemia, unspecified: Secondary | ICD-10-CM | POA: Diagnosis not present

## 2021-04-22 DIAGNOSIS — C719 Malignant neoplasm of brain, unspecified: Secondary | ICD-10-CM | POA: Diagnosis not present

## 2021-04-22 DIAGNOSIS — C711 Malignant neoplasm of frontal lobe: Secondary | ICD-10-CM | POA: Diagnosis not present

## 2021-04-22 DIAGNOSIS — I10 Essential (primary) hypertension: Secondary | ICD-10-CM | POA: Diagnosis not present

## 2021-04-22 DIAGNOSIS — R569 Unspecified convulsions: Secondary | ICD-10-CM | POA: Diagnosis not present

## 2021-04-22 DIAGNOSIS — G40909 Epilepsy, unspecified, not intractable, without status epilepticus: Secondary | ICD-10-CM | POA: Diagnosis not present

## 2021-04-22 DIAGNOSIS — E039 Hypothyroidism, unspecified: Secondary | ICD-10-CM | POA: Diagnosis not present

## 2021-04-28 DIAGNOSIS — C719 Malignant neoplasm of brain, unspecified: Secondary | ICD-10-CM | POA: Diagnosis not present

## 2021-04-28 DIAGNOSIS — R569 Unspecified convulsions: Secondary | ICD-10-CM | POA: Diagnosis not present

## 2021-04-28 DIAGNOSIS — E785 Hyperlipidemia, unspecified: Secondary | ICD-10-CM | POA: Diagnosis not present

## 2021-04-28 DIAGNOSIS — I10 Essential (primary) hypertension: Secondary | ICD-10-CM | POA: Diagnosis not present

## 2021-04-28 DIAGNOSIS — E039 Hypothyroidism, unspecified: Secondary | ICD-10-CM | POA: Diagnosis not present

## 2021-04-29 DIAGNOSIS — C719 Malignant neoplasm of brain, unspecified: Secondary | ICD-10-CM | POA: Diagnosis not present

## 2021-04-29 DIAGNOSIS — E039 Hypothyroidism, unspecified: Secondary | ICD-10-CM | POA: Diagnosis not present

## 2021-04-29 DIAGNOSIS — R569 Unspecified convulsions: Secondary | ICD-10-CM | POA: Diagnosis not present

## 2021-04-29 DIAGNOSIS — I10 Essential (primary) hypertension: Secondary | ICD-10-CM | POA: Diagnosis not present

## 2021-04-29 DIAGNOSIS — E785 Hyperlipidemia, unspecified: Secondary | ICD-10-CM | POA: Diagnosis not present

## 2021-04-30 DIAGNOSIS — C719 Malignant neoplasm of brain, unspecified: Secondary | ICD-10-CM | POA: Diagnosis not present

## 2021-04-30 DIAGNOSIS — I10 Essential (primary) hypertension: Secondary | ICD-10-CM | POA: Diagnosis not present

## 2021-04-30 DIAGNOSIS — R569 Unspecified convulsions: Secondary | ICD-10-CM | POA: Diagnosis not present

## 2021-04-30 DIAGNOSIS — E039 Hypothyroidism, unspecified: Secondary | ICD-10-CM | POA: Diagnosis not present

## 2021-04-30 DIAGNOSIS — E785 Hyperlipidemia, unspecified: Secondary | ICD-10-CM | POA: Diagnosis not present

## 2021-05-01 DIAGNOSIS — I10 Essential (primary) hypertension: Secondary | ICD-10-CM | POA: Diagnosis not present

## 2021-05-01 DIAGNOSIS — C719 Malignant neoplasm of brain, unspecified: Secondary | ICD-10-CM | POA: Diagnosis not present

## 2021-05-01 DIAGNOSIS — E039 Hypothyroidism, unspecified: Secondary | ICD-10-CM | POA: Diagnosis not present

## 2021-05-01 DIAGNOSIS — R569 Unspecified convulsions: Secondary | ICD-10-CM | POA: Diagnosis not present

## 2021-05-01 DIAGNOSIS — E785 Hyperlipidemia, unspecified: Secondary | ICD-10-CM | POA: Diagnosis not present

## 2021-05-02 DIAGNOSIS — E785 Hyperlipidemia, unspecified: Secondary | ICD-10-CM | POA: Diagnosis not present

## 2021-05-02 DIAGNOSIS — E039 Hypothyroidism, unspecified: Secondary | ICD-10-CM | POA: Diagnosis not present

## 2021-05-02 DIAGNOSIS — I10 Essential (primary) hypertension: Secondary | ICD-10-CM | POA: Diagnosis not present

## 2021-05-02 DIAGNOSIS — R569 Unspecified convulsions: Secondary | ICD-10-CM | POA: Diagnosis not present

## 2021-05-02 DIAGNOSIS — C719 Malignant neoplasm of brain, unspecified: Secondary | ICD-10-CM | POA: Diagnosis not present

## 2021-05-05 ENCOUNTER — Other Ambulatory Visit (HOSPITAL_COMMUNITY): Payer: Self-pay

## 2021-05-05 DIAGNOSIS — E785 Hyperlipidemia, unspecified: Secondary | ICD-10-CM | POA: Diagnosis not present

## 2021-05-05 DIAGNOSIS — E039 Hypothyroidism, unspecified: Secondary | ICD-10-CM | POA: Diagnosis not present

## 2021-05-05 DIAGNOSIS — I10 Essential (primary) hypertension: Secondary | ICD-10-CM | POA: Diagnosis not present

## 2021-05-05 DIAGNOSIS — R569 Unspecified convulsions: Secondary | ICD-10-CM | POA: Diagnosis not present

## 2021-05-05 DIAGNOSIS — C719 Malignant neoplasm of brain, unspecified: Secondary | ICD-10-CM | POA: Diagnosis not present

## 2021-05-06 DIAGNOSIS — C719 Malignant neoplasm of brain, unspecified: Secondary | ICD-10-CM | POA: Diagnosis not present

## 2021-05-06 DIAGNOSIS — R569 Unspecified convulsions: Secondary | ICD-10-CM | POA: Diagnosis not present

## 2021-05-06 DIAGNOSIS — I10 Essential (primary) hypertension: Secondary | ICD-10-CM | POA: Diagnosis not present

## 2021-05-06 DIAGNOSIS — E039 Hypothyroidism, unspecified: Secondary | ICD-10-CM | POA: Diagnosis not present

## 2021-05-06 DIAGNOSIS — E785 Hyperlipidemia, unspecified: Secondary | ICD-10-CM | POA: Diagnosis not present

## 2021-05-07 DIAGNOSIS — E039 Hypothyroidism, unspecified: Secondary | ICD-10-CM | POA: Diagnosis not present

## 2021-05-07 DIAGNOSIS — I10 Essential (primary) hypertension: Secondary | ICD-10-CM | POA: Diagnosis not present

## 2021-05-07 DIAGNOSIS — E785 Hyperlipidemia, unspecified: Secondary | ICD-10-CM | POA: Diagnosis not present

## 2021-05-07 DIAGNOSIS — C719 Malignant neoplasm of brain, unspecified: Secondary | ICD-10-CM | POA: Diagnosis not present

## 2021-05-07 DIAGNOSIS — R569 Unspecified convulsions: Secondary | ICD-10-CM | POA: Diagnosis not present

## 2021-05-09 DIAGNOSIS — I10 Essential (primary) hypertension: Secondary | ICD-10-CM | POA: Diagnosis not present

## 2021-05-09 DIAGNOSIS — R569 Unspecified convulsions: Secondary | ICD-10-CM | POA: Diagnosis not present

## 2021-05-09 DIAGNOSIS — E785 Hyperlipidemia, unspecified: Secondary | ICD-10-CM | POA: Diagnosis not present

## 2021-05-09 DIAGNOSIS — E039 Hypothyroidism, unspecified: Secondary | ICD-10-CM | POA: Diagnosis not present

## 2021-05-09 DIAGNOSIS — C719 Malignant neoplasm of brain, unspecified: Secondary | ICD-10-CM | POA: Diagnosis not present

## 2021-05-12 ENCOUNTER — Other Ambulatory Visit (HOSPITAL_COMMUNITY): Payer: Self-pay

## 2021-05-12 DIAGNOSIS — E785 Hyperlipidemia, unspecified: Secondary | ICD-10-CM | POA: Diagnosis not present

## 2021-05-12 DIAGNOSIS — I10 Essential (primary) hypertension: Secondary | ICD-10-CM | POA: Diagnosis not present

## 2021-05-12 DIAGNOSIS — E039 Hypothyroidism, unspecified: Secondary | ICD-10-CM | POA: Diagnosis not present

## 2021-05-12 DIAGNOSIS — C719 Malignant neoplasm of brain, unspecified: Secondary | ICD-10-CM | POA: Diagnosis not present

## 2021-05-12 DIAGNOSIS — R569 Unspecified convulsions: Secondary | ICD-10-CM | POA: Diagnosis not present

## 2021-05-13 DIAGNOSIS — E039 Hypothyroidism, unspecified: Secondary | ICD-10-CM | POA: Diagnosis not present

## 2021-05-13 DIAGNOSIS — E785 Hyperlipidemia, unspecified: Secondary | ICD-10-CM | POA: Diagnosis not present

## 2021-05-13 DIAGNOSIS — I10 Essential (primary) hypertension: Secondary | ICD-10-CM | POA: Diagnosis not present

## 2021-05-13 DIAGNOSIS — C719 Malignant neoplasm of brain, unspecified: Secondary | ICD-10-CM | POA: Diagnosis not present

## 2021-05-13 DIAGNOSIS — R569 Unspecified convulsions: Secondary | ICD-10-CM | POA: Diagnosis not present

## 2021-05-14 DIAGNOSIS — E039 Hypothyroidism, unspecified: Secondary | ICD-10-CM | POA: Diagnosis not present

## 2021-05-14 DIAGNOSIS — I10 Essential (primary) hypertension: Secondary | ICD-10-CM | POA: Diagnosis not present

## 2021-05-14 DIAGNOSIS — E785 Hyperlipidemia, unspecified: Secondary | ICD-10-CM | POA: Diagnosis not present

## 2021-05-14 DIAGNOSIS — C719 Malignant neoplasm of brain, unspecified: Secondary | ICD-10-CM | POA: Diagnosis not present

## 2021-05-14 DIAGNOSIS — R569 Unspecified convulsions: Secondary | ICD-10-CM | POA: Diagnosis not present

## 2021-05-16 DIAGNOSIS — E785 Hyperlipidemia, unspecified: Secondary | ICD-10-CM | POA: Diagnosis not present

## 2021-05-16 DIAGNOSIS — I10 Essential (primary) hypertension: Secondary | ICD-10-CM | POA: Diagnosis not present

## 2021-05-16 DIAGNOSIS — R569 Unspecified convulsions: Secondary | ICD-10-CM | POA: Diagnosis not present

## 2021-05-16 DIAGNOSIS — E039 Hypothyroidism, unspecified: Secondary | ICD-10-CM | POA: Diagnosis not present

## 2021-05-16 DIAGNOSIS — C719 Malignant neoplasm of brain, unspecified: Secondary | ICD-10-CM | POA: Diagnosis not present

## 2021-05-19 DIAGNOSIS — C719 Malignant neoplasm of brain, unspecified: Secondary | ICD-10-CM | POA: Diagnosis not present

## 2021-05-19 DIAGNOSIS — E039 Hypothyroidism, unspecified: Secondary | ICD-10-CM | POA: Diagnosis not present

## 2021-05-19 DIAGNOSIS — E785 Hyperlipidemia, unspecified: Secondary | ICD-10-CM | POA: Diagnosis not present

## 2021-05-19 DIAGNOSIS — I10 Essential (primary) hypertension: Secondary | ICD-10-CM | POA: Diagnosis not present

## 2021-05-19 DIAGNOSIS — R569 Unspecified convulsions: Secondary | ICD-10-CM | POA: Diagnosis not present

## 2021-05-20 ENCOUNTER — Other Ambulatory Visit: Payer: Self-pay | Admitting: Internal Medicine

## 2021-05-20 ENCOUNTER — Telehealth: Payer: Self-pay | Admitting: *Deleted

## 2021-05-20 DIAGNOSIS — R569 Unspecified convulsions: Secondary | ICD-10-CM | POA: Diagnosis not present

## 2021-05-20 DIAGNOSIS — C719 Malignant neoplasm of brain, unspecified: Secondary | ICD-10-CM | POA: Diagnosis not present

## 2021-05-20 DIAGNOSIS — I10 Essential (primary) hypertension: Secondary | ICD-10-CM | POA: Diagnosis not present

## 2021-05-20 DIAGNOSIS — E039 Hypothyroidism, unspecified: Secondary | ICD-10-CM | POA: Diagnosis not present

## 2021-05-20 DIAGNOSIS — E785 Hyperlipidemia, unspecified: Secondary | ICD-10-CM | POA: Diagnosis not present

## 2021-05-20 MED ORDER — AMOXICILLIN-POT CLAVULANATE 875-125 MG PO TABS: 1.0000 | ORAL_TABLET | Freq: Two times a day (BID) | ORAL | 0 refills | Status: AC

## 2021-05-20 NOTE — Telephone Encounter (Signed)
Received call from Hospice,  patient is having drainage from the right eye and the sclera is red, patient developed a fever.  The family requests an antibiotic.  Augmentin called in to pharmacy.  Communicated to patients daughter Rx called in.

## 2021-05-21 DIAGNOSIS — I10 Essential (primary) hypertension: Secondary | ICD-10-CM | POA: Diagnosis not present

## 2021-05-21 DIAGNOSIS — C719 Malignant neoplasm of brain, unspecified: Secondary | ICD-10-CM | POA: Diagnosis not present

## 2021-05-21 DIAGNOSIS — R569 Unspecified convulsions: Secondary | ICD-10-CM | POA: Diagnosis not present

## 2021-05-21 DIAGNOSIS — E785 Hyperlipidemia, unspecified: Secondary | ICD-10-CM | POA: Diagnosis not present

## 2021-05-21 DIAGNOSIS — E039 Hypothyroidism, unspecified: Secondary | ICD-10-CM | POA: Diagnosis not present

## 2021-05-27 DIAGNOSIS — I10 Essential (primary) hypertension: Secondary | ICD-10-CM | POA: Diagnosis not present

## 2021-05-27 DIAGNOSIS — E039 Hypothyroidism, unspecified: Secondary | ICD-10-CM | POA: Diagnosis not present

## 2021-05-27 DIAGNOSIS — R569 Unspecified convulsions: Secondary | ICD-10-CM | POA: Diagnosis not present

## 2021-05-27 DIAGNOSIS — C719 Malignant neoplasm of brain, unspecified: Secondary | ICD-10-CM | POA: Diagnosis not present

## 2021-05-27 DIAGNOSIS — E785 Hyperlipidemia, unspecified: Secondary | ICD-10-CM | POA: Diagnosis not present

## 2021-05-28 DIAGNOSIS — E039 Hypothyroidism, unspecified: Secondary | ICD-10-CM | POA: Diagnosis not present

## 2021-05-28 DIAGNOSIS — R569 Unspecified convulsions: Secondary | ICD-10-CM | POA: Diagnosis not present

## 2021-05-28 DIAGNOSIS — E785 Hyperlipidemia, unspecified: Secondary | ICD-10-CM | POA: Diagnosis not present

## 2021-05-28 DIAGNOSIS — C719 Malignant neoplasm of brain, unspecified: Secondary | ICD-10-CM | POA: Diagnosis not present

## 2021-05-28 DIAGNOSIS — I10 Essential (primary) hypertension: Secondary | ICD-10-CM | POA: Diagnosis not present

## 2021-05-30 DIAGNOSIS — R569 Unspecified convulsions: Secondary | ICD-10-CM | POA: Diagnosis not present

## 2021-05-30 DIAGNOSIS — E039 Hypothyroidism, unspecified: Secondary | ICD-10-CM | POA: Diagnosis not present

## 2021-05-30 DIAGNOSIS — I10 Essential (primary) hypertension: Secondary | ICD-10-CM | POA: Diagnosis not present

## 2021-05-30 DIAGNOSIS — E785 Hyperlipidemia, unspecified: Secondary | ICD-10-CM | POA: Diagnosis not present

## 2021-05-30 DIAGNOSIS — C719 Malignant neoplasm of brain, unspecified: Secondary | ICD-10-CM | POA: Diagnosis not present

## 2021-05-31 DIAGNOSIS — E785 Hyperlipidemia, unspecified: Secondary | ICD-10-CM | POA: Diagnosis not present

## 2021-05-31 DIAGNOSIS — E039 Hypothyroidism, unspecified: Secondary | ICD-10-CM | POA: Diagnosis not present

## 2021-05-31 DIAGNOSIS — C719 Malignant neoplasm of brain, unspecified: Secondary | ICD-10-CM | POA: Diagnosis not present

## 2021-05-31 DIAGNOSIS — I10 Essential (primary) hypertension: Secondary | ICD-10-CM | POA: Diagnosis not present

## 2021-05-31 DIAGNOSIS — R569 Unspecified convulsions: Secondary | ICD-10-CM | POA: Diagnosis not present

## 2021-06-03 ENCOUNTER — Other Ambulatory Visit (HOSPITAL_COMMUNITY): Payer: Self-pay

## 2021-07-01 ENCOUNTER — Telehealth: Payer: Self-pay | Admitting: *Deleted

## 2021-07-01 NOTE — Telephone Encounter (Signed)
Received call from Nurse Santiago Glad with [facility] [phone].  She states that patient is visited once a week and the patient had issues with skin breakdown to the sacral area.  They had standing orders for wet to dry dressing changes once daily.  Patient is incontinent and wear briefs.  She is being repositioned, nurse reinforced importance of changing positions at max every 2 hours.  Family reports odor and green discharge to the wound area.  Nurse upon inspection did not see evidence of green discharge and did not experience any odor.  Patient does not have a fever.  She needed further instructions on how to handle the wound.  Routing to MD to advise.

## 2021-07-08 ENCOUNTER — Other Ambulatory Visit: Payer: Self-pay | Admitting: *Deleted

## 2021-07-08 MED ORDER — DAKINS (1/2 STRENGTH) 0.25 % EX SOLN: 1.0000 "application " | Freq: Once | CUTANEOUS | 1 refills | Status: AC

## 2021-07-08 NOTE — Progress Notes (Signed)
Received call from [facility] [phone].  She states that upon visiting the patient today she inspected the sacral wound.  Found it to be approximately 4cm X 4cm no depth documented.  She states that daughter had cleaned the area once already today about an hour prior to her visit.  The area had serous sanguineous drainage and the wound did  have a foul odor to it.    Consulted our trained wound nurse and palliative care NP Nikki and brought her recommendation to Dr Mickeal Skinner who agreed to proceed with Saline rinse 1 - 2 times daily followed by Irrigation of Dakin's solution and then to cover the area with a foam dressing.    Order placed and nurse and family made aware.

## 2021-07-10 ENCOUNTER — Encounter: Payer: Self-pay | Admitting: Internal Medicine

## 2021-07-11 ENCOUNTER — Telehealth: Payer: Self-pay

## 2021-07-11 NOTE — Telephone Encounter (Signed)
Returned daughter's call and relayed message from [place] dated 07/11/21 regarding wound care:  The wound most likely is changing colors because the Dakins is offering a more antiseptic approach. I consulted with Wound Care RN and she reviewed pictures. She recommends  changing dressing twice daily wet to dry and can also use vaseline guaze if available to provide added moisture.(Dakins to be used once daily). If they are using full strength Dakins maybe have them to drop to half strength which can be diluted with sterile water or saline.  Daughter voiced understanding.

## 2021-07-15 ENCOUNTER — Other Ambulatory Visit: Payer: Self-pay | Admitting: *Deleted

## 2021-07-30 DEATH — deceased

## 2022-05-29 IMAGING — CT CT HEAD W/O CM
3 series · 15 of 47 positions shown, 18 images · non-contrast
Comparison: Brain MRI August 2020; head CT August 30, 2020

CLINICAL DATA: Dizziness. Previous craniotomy for right frontal
brain tumor

EXAM:
CT HEAD WITHOUT CONTRAST
TECHNIQUE: Contiguous axial images were obtained from the base of the skull
through the vertex without intravenous contrast.

[Series 3: head wo · axial · 0.44mm/px · z∈[-122,+18]mm · 9 of 34 slices shown, 12 images]
[im 3/34  brain]
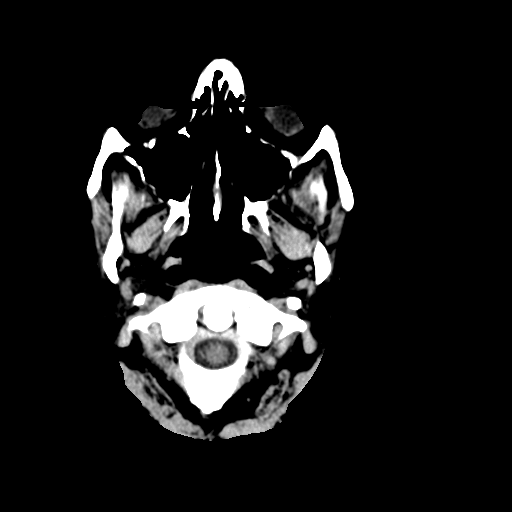
[im 3/34  bone]
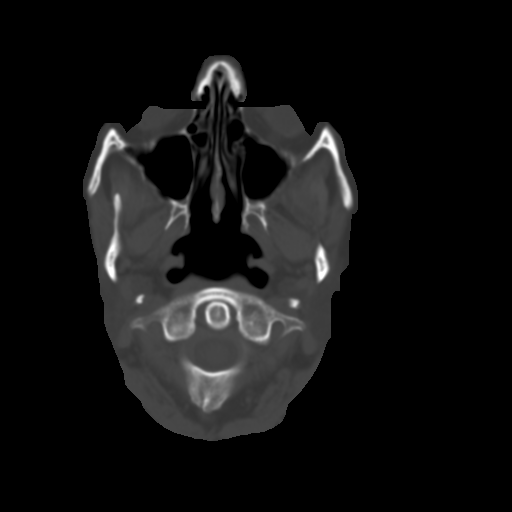
[im 6/34  brain]
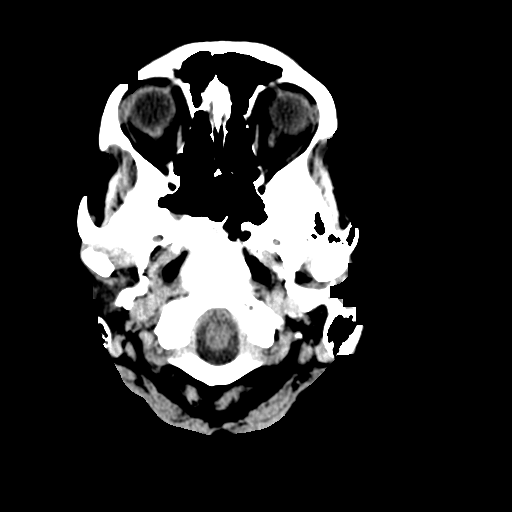
[im 10/34  brain]
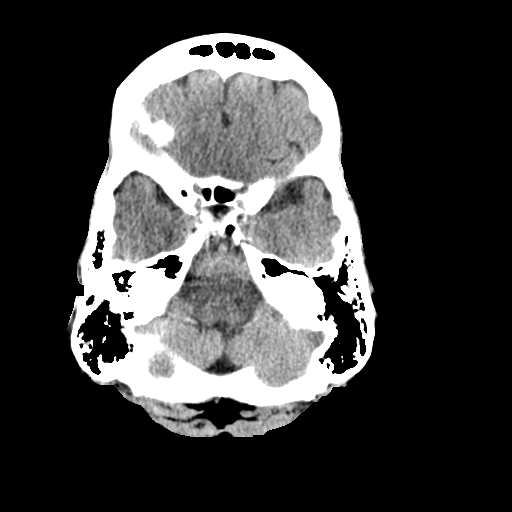
[im 13/34  brain]
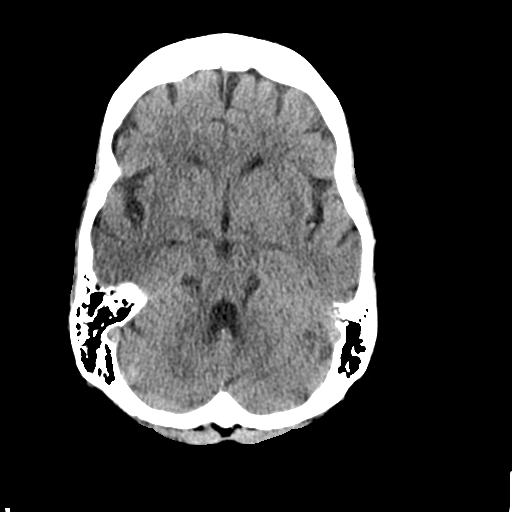
[im 18/34  brain]
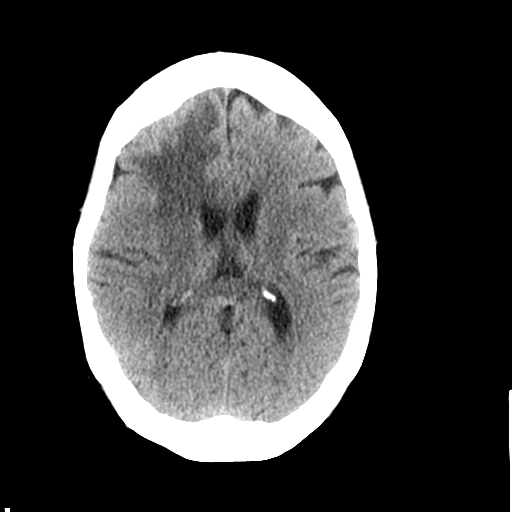
[im 18/34  bone]
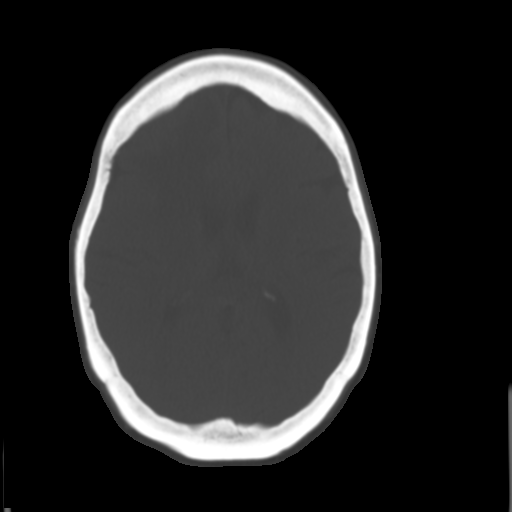
[im 21/34  brain]
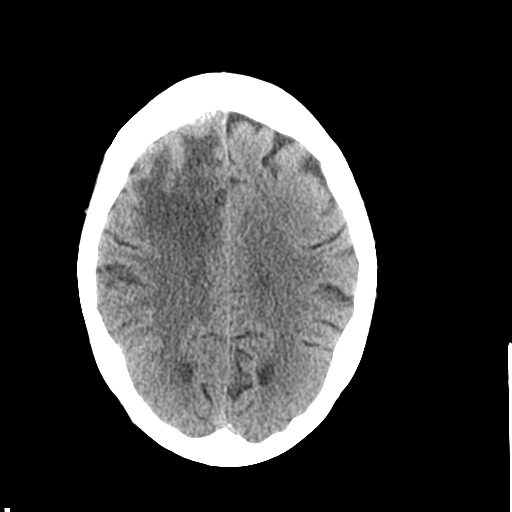
[im 24/34  brain]
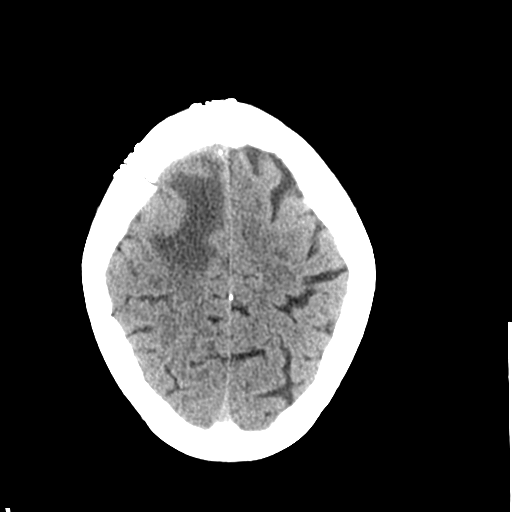
[im 28/34  brain]
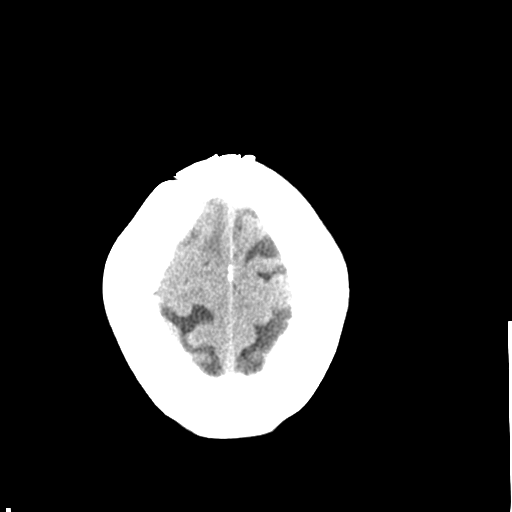
[im 31/34  brain]
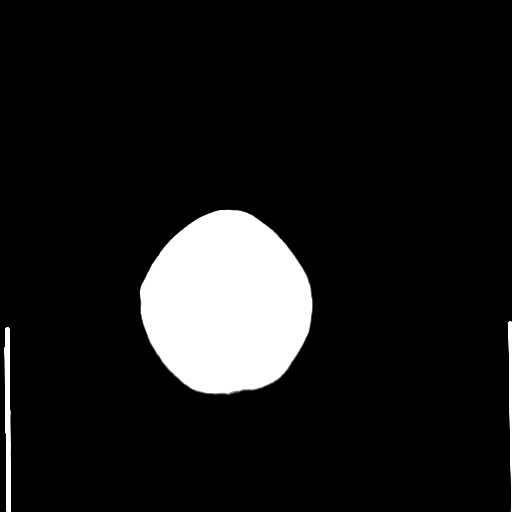
[im 31/34  bone]
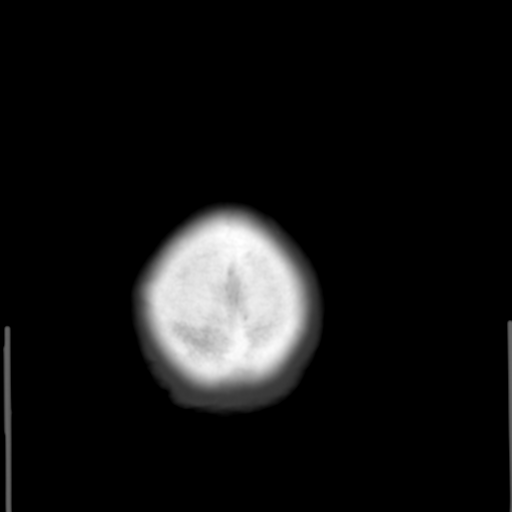

[Series 5: coronal soft tissue · coronal · 0.32mm/px · 3 of 74 slices shown]
[im 25/74  brain]
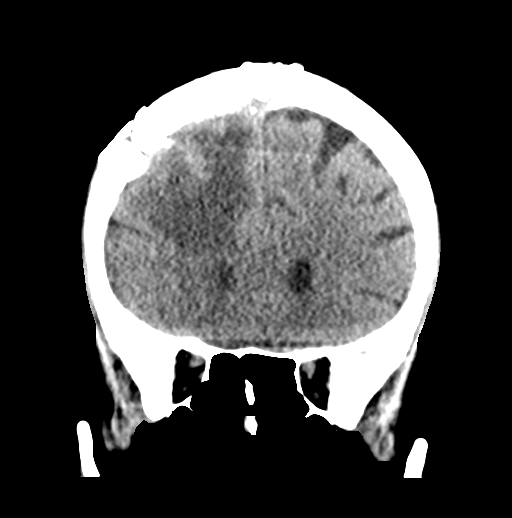
[im 33/74  brain]
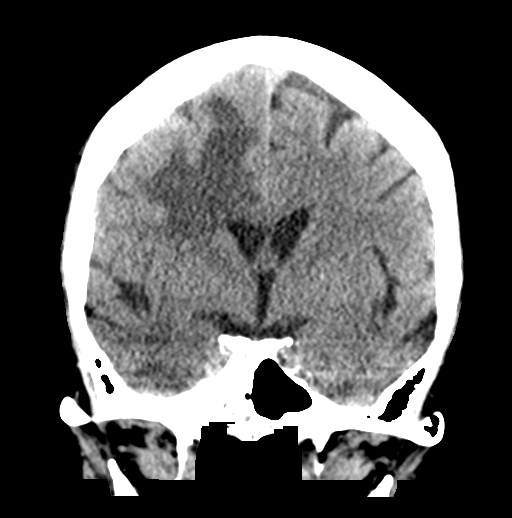
[im 41/74  brain]
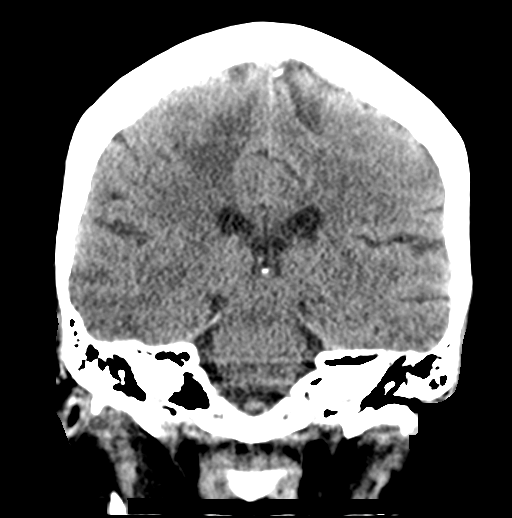

[Series 6: sagittal soft tissue · sagittal · 0.33mm/px · 3 of 54 slices shown]
[im 18/54  brain]
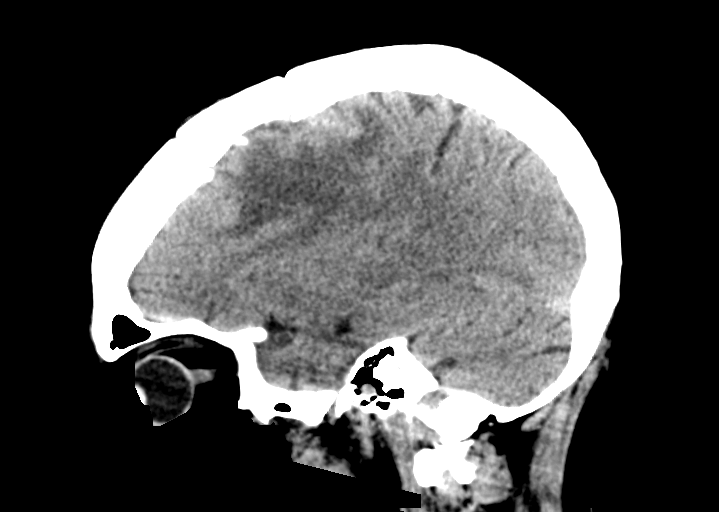
[im 27/54  brain]
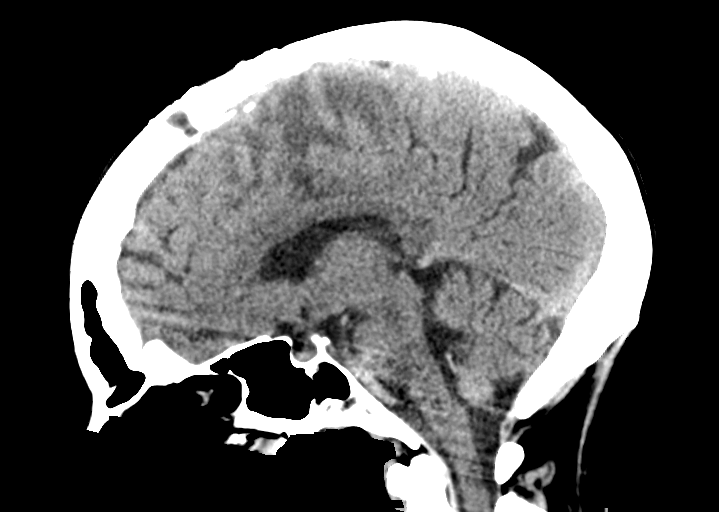
[im 36/54  brain]
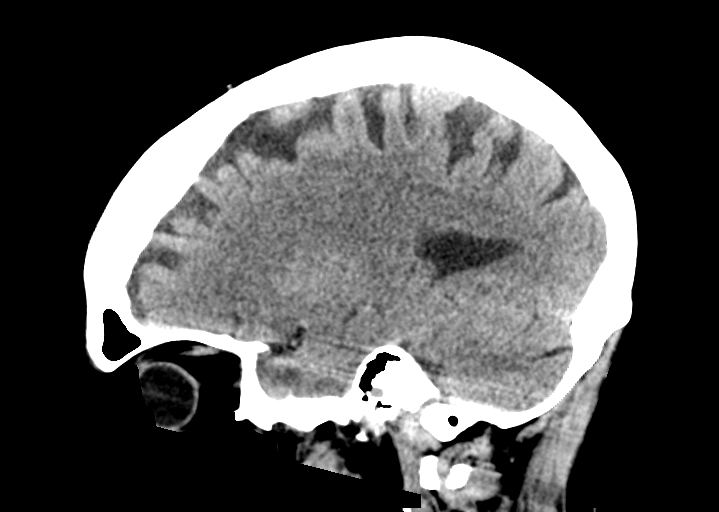

[15 of 47 positions shown; findings below may reference images not displayed]

FINDINGS: Brain: The ventricles and sulci overall are normal in size and
configuration. There is vasogenic appearing edema in the mid to
upper right frontal lobe anteriorly with mild effacement of a
portion of the anterior horn the right lateral ventricle. Go similar
edema elsewhere. No well-defined mass on noncontrast enhanced study.
No evident hemorrhage. No extra-axial fluid collection or midline
shift. Elsewhere brain parenchyma appears unremarkable.

Vascular: No hyperdense vessel. There is calcification in the left
carotid siphon region.

Skull: Patient is status post superior right frontal craniotomy.
Bony calvarium otherwise intact.

Sinuses/Orbits: Visualized paranasal sinuses are clear. Orbits
appear symmetric bilaterally.

Other: Mastoid air cells are clear.
IMPRESSION: 1. Apparent vasogenic edema in the right frontal lobe region in the
mid to superior aspect of the right frontal lobe mild effacement of
the anterior horn the right lateral ventricle. Concern for recurrent
mass in this area. Advise correlation with brain MRI pre and
post-contrast to further evaluate.

2. Elsewhere brain parenchyma appears unremarkable. No acute infarct
appreciable the study. No hemorrhage evident.

3.  Status post right frontal craniotomy.

4.  Mild arterial vascular calcification noted.

## 2022-05-29 IMAGING — MR MR HEAD WO/W CM
13 series · 48 of 48 positions shown · IV contrast (gadavist)
Comparison: Head CT 11/27/2020 and MRI 09/06/2020

CLINICAL DATA: Glioblastoma.

EXAM:
MRI HEAD WITHOUT AND WITH CONTRAST
TECHNIQUE: Multiplanar, multiecho pulse sequences of the brain and surrounding
structures were obtained without and with intravenous contrast.
CONTRAST:  5.5mL GADAVIST GADOBUTROL 1 MMOL/ML IV SOLN

[Series 5: DWI · axial · 3.0mm · 1.36mm/px · z∈[-14,+133]mm · 5 of 100 slices shown (1 of 2)]
[im 1/100]
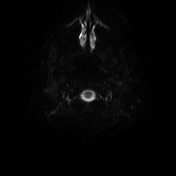
[im 25/100]
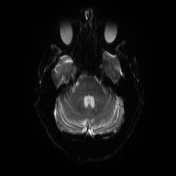
[im 50/100]
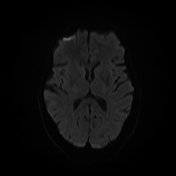
[im 75/100]
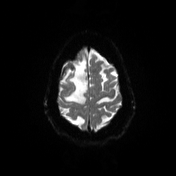
[im 100/100]
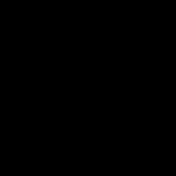

[Series 6: DWI · axial · 3.0mm · 1.36mm/px · z∈[-14,+130]mm · 3 of 49 slices shown (2 of 2)]
[im 1/49]
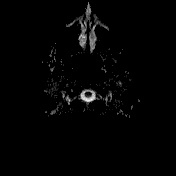
[im 25/49]
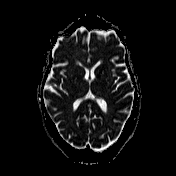
[im 49/49]
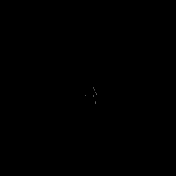

[Series 7: T1 · sagittal · 5.0mm · 0.75mm/px · 1 of 24 slices shown (1 of 2)]
[im 1/24]
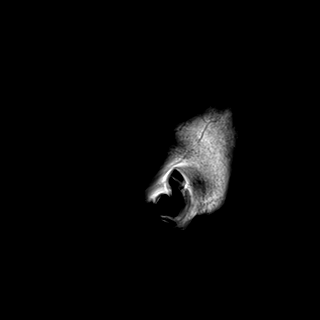

[Series 8: T2 · axial · 5.0mm · 0.62mm/px · z∈[-23,+140]mm · 2 of 26 slices shown]
[im 1/26]
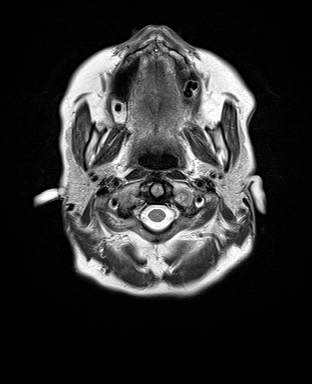
[im 26/26]
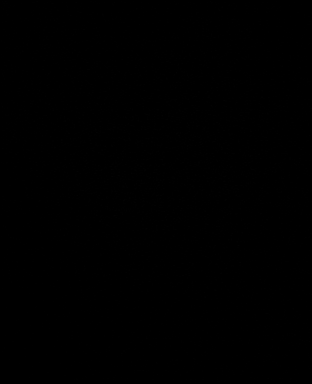

[Series 9: swi_images · axial · 3.0mm · 0.75mm/px · z∈[-30,+147]mm · 4 of 60 slices shown]
[im 1/60]
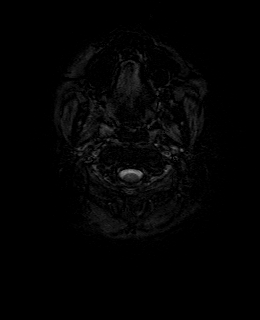
[im 20/60]
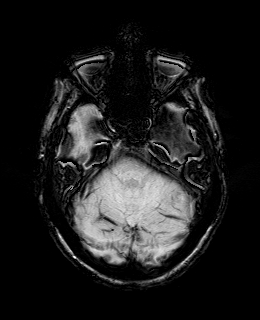
[im 40/60]
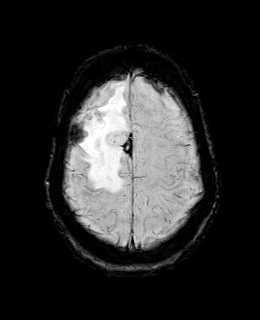
[im 60/60]
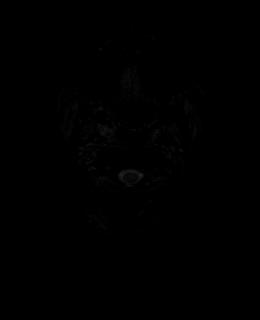

[Series 11: FLAIR · axial · 3.0mm · 0.75mm/px · z∈[-21,+138]mm · 3 of 54 slices shown]
[im 1/54]
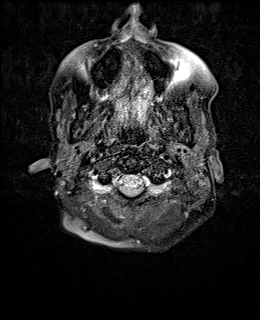
[im 27/54]
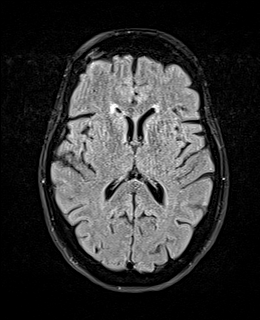
[im 54/54]
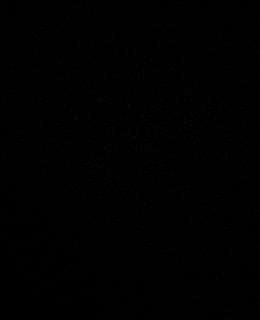

[Series 12: T1 · axial · 1.0mm · 0.94mm/px · z∈[-21,+138]mm · 10 of 159 slices shown (2 of 2)]
[im 1/159]
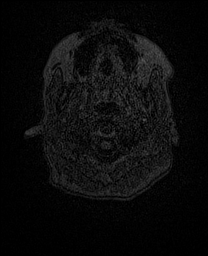
[im 18/159]
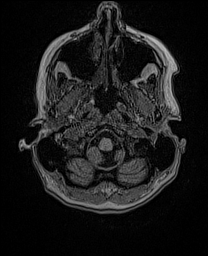
[im 36/159]
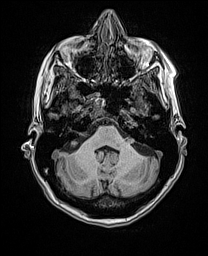
[im 53/159]
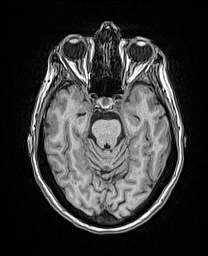
[im 71/159]
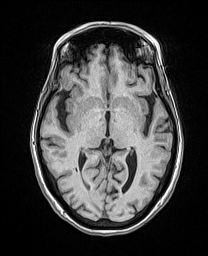
[im 88/159]
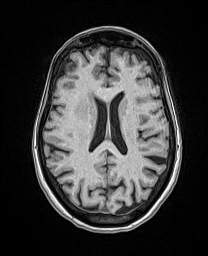
[im 106/159]
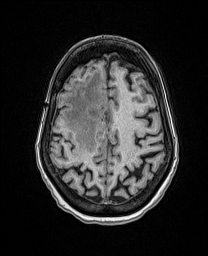
[im 123/159]
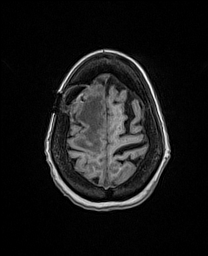
[im 141/159]
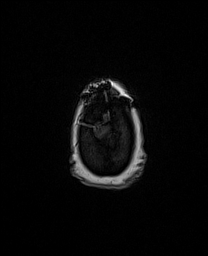
[im 159/159]
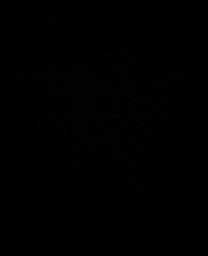

[Series 13: cor dwi_tracew · coronal · 5.0mm · 1.53mm/px · 3 of 52 slices shown]
[im 1/52]
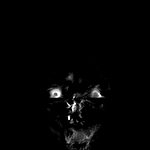
[im 26/52]
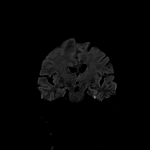
[im 52/52]
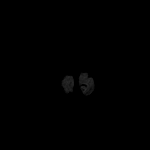

[Series 14: cor dwi_adc · coronal · 5.0mm · 1.53mm/px · 2 of 26 slices shown]
[im 1/26]
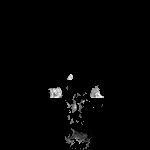
[im 26/26]
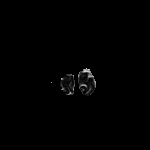

[Series 15: T2 post-contrast · coronal · 5.0mm · 0.57mm/px · 2 of 29 slices shown]
[im 1/29]
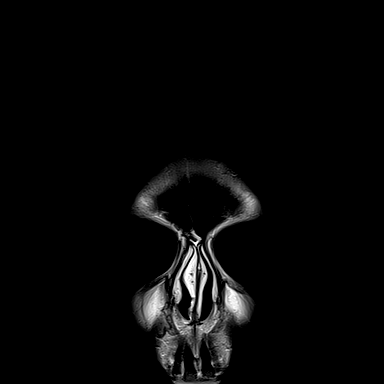
[im 29/29]
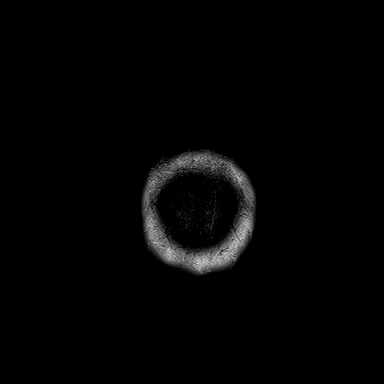

[Series 16: T1 post-contrast · axial · 1.0mm · 0.94mm/px · z∈[-21,+138]mm · 10 of 160 slices shown (1 of 3)]
[im 1/160]
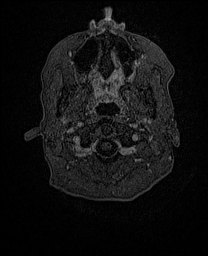
[im 18/160]
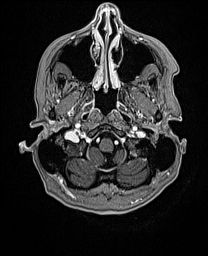
[im 36/160]
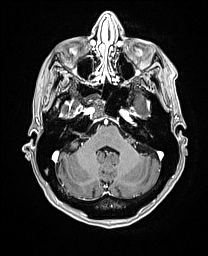
[im 54/160]
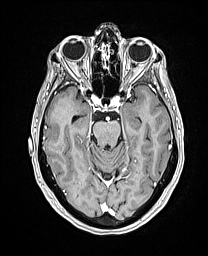
[im 71/160]
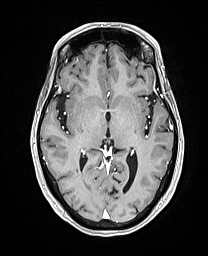
[im 89/160]
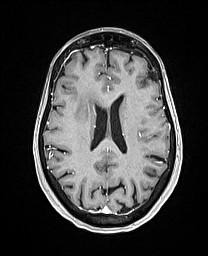
[im 107/160]
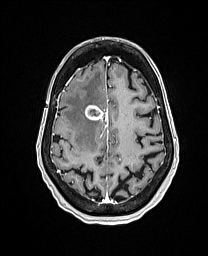
[im 124/160]
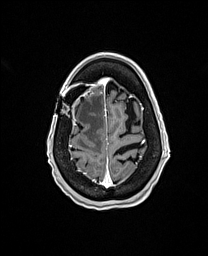
[im 142/160]
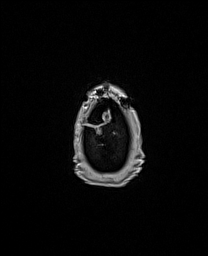
[im 160/160]
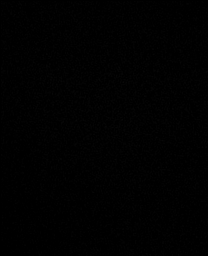

[Series 17: T1 post-contrast · coronal · 5.0mm · 0.43mm/px · 2 of 29 slices shown (2 of 3)]
[im 1/29]
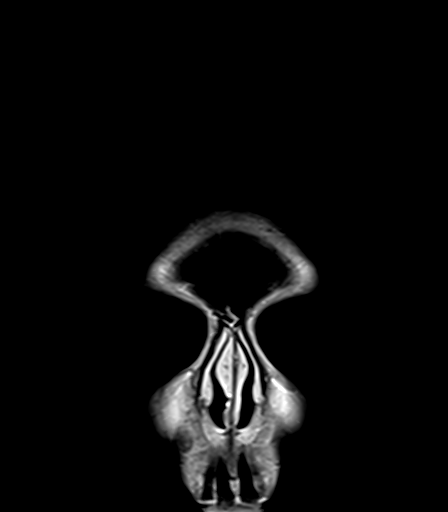
[im 29/29]
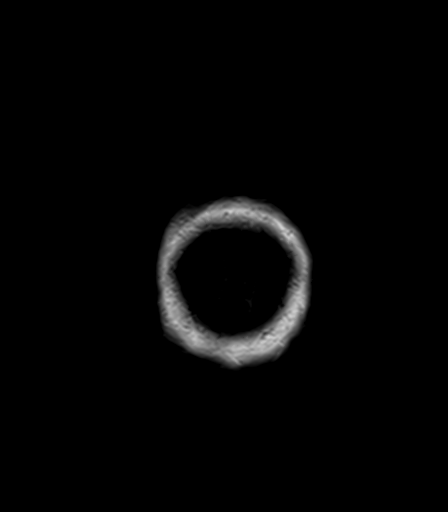

[Series 18: T1 post-contrast · sagittal · 5.0mm · 0.75mm/px · 1 of 24 slices shown (3 of 3)]
[im 1/24]
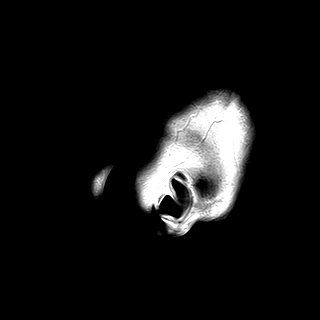

[48 of 48 positions shown; findings below may reference images not displayed]

FINDINGS: Brain: Sequelae of right frontal craniotomy and tumor resection are
again identified. A resection cavity in the parasagittal right
frontal lobe contains chronic blood products and has collapsed since
the prior MRI. There is a new 2.0 x 1.3 x 1.9 cm right frontal
lesion located immediately lateral and superior to the collapsed
resection cavity which demonstrates thick peripheral enhancement
with enhancement extending medially to the falx. Extensive
nonenhancing T2 hyperintensity in the surrounding right frontal
white matter is greatly increased from the prior MRI with associated
gyral widening and sulcal effacement. There is slight mass effect on
the right lateral ventricle without significant midline shift.

Elsewhere, no acute infarct, acute intracranial hemorrhage, or
extra-axial fluid collection is identified. There is no
hydrocephalus.

Vascular: Major intracranial vascular flow voids are preserved.

Skull and upper cervical spine: Right frontal craniotomy with mild
underlying dural enhancement.

Sinuses/Orbits: Unremarkable orbits. Mild mucosal thickening in the
right sphenoid sinus. Clear mastoid air cells.

Other: None.
IMPRESSION: New 2 cm ring-enhancing right frontal lesion with extensive
surrounding edema. This may reflect post radiation changes/radiation
necrosis or progressive tumor.

## 2023-06-09 ENCOUNTER — Other Ambulatory Visit: Payer: Self-pay

## 2023-06-11 ENCOUNTER — Other Ambulatory Visit: Payer: Self-pay

## 2023-07-19 ENCOUNTER — Other Ambulatory Visit: Payer: Self-pay

## 2023-12-13 ENCOUNTER — Other Ambulatory Visit (HOSPITAL_COMMUNITY): Payer: Self-pay

## 2024-02-18 ENCOUNTER — Other Ambulatory Visit: Payer: Self-pay

## 2024-07-07 ENCOUNTER — Other Ambulatory Visit: Payer: Self-pay
